# Patient Record
Sex: Male | Born: 1945 | Race: White | Hispanic: No | Marital: Married | Smoking: Former smoker
Health system: Southern US, Community
[De-identification: ages and names within clinical notes are randomized; demographics above are authoritative.]

## PROBLEM LIST (undated history)

## (undated) DIAGNOSIS — H409 Unspecified glaucoma: Secondary | ICD-10-CM

## (undated) DIAGNOSIS — I714 Abdominal aortic aneurysm, without rupture, unspecified: Secondary | ICD-10-CM

## (undated) DIAGNOSIS — E7800 Pure hypercholesterolemia, unspecified: Secondary | ICD-10-CM

## (undated) DIAGNOSIS — C439 Malignant melanoma of skin, unspecified: Secondary | ICD-10-CM

## (undated) DIAGNOSIS — I10 Essential (primary) hypertension: Secondary | ICD-10-CM

## (undated) DIAGNOSIS — E785 Hyperlipidemia, unspecified: Secondary | ICD-10-CM

## (undated) DIAGNOSIS — N189 Chronic kidney disease, unspecified: Secondary | ICD-10-CM

## (undated) DIAGNOSIS — M353 Polymyalgia rheumatica: Secondary | ICD-10-CM

## (undated) DIAGNOSIS — E11319 Type 2 diabetes mellitus with unspecified diabetic retinopathy without macular edema: Secondary | ICD-10-CM

## (undated) DIAGNOSIS — K635 Polyp of colon: Secondary | ICD-10-CM

## (undated) DIAGNOSIS — E119 Type 2 diabetes mellitus without complications: Secondary | ICD-10-CM

## (undated) HISTORY — DX: Unspecified glaucoma: H40.9

## (undated) HISTORY — DX: Type 2 diabetes mellitus with unspecified diabetic retinopathy without macular edema: E11.319

## (undated) HISTORY — DX: Abdominal aortic aneurysm, without rupture, unspecified: I71.40

## (undated) HISTORY — PX: TONSILLECTOMY: SUR1361

## (undated) HISTORY — DX: Hyperlipidemia, unspecified: E78.5

## (undated) HISTORY — DX: Abdominal aortic aneurysm, without rupture: I71.4

## (undated) HISTORY — DX: Malignant melanoma of skin, unspecified: C43.9

## (undated) HISTORY — DX: Chronic kidney disease, unspecified: N18.9

## (undated) HISTORY — DX: Polyp of colon: K63.5

---
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
## 1986-03-22 HISTORY — PX: BACK SURGERY: SHX140

## 2000-01-23 ENCOUNTER — Ambulatory Visit (HOSPITAL_COMMUNITY): Admission: RE | Admit: 2000-01-23 | Discharge: 2000-01-23 | Payer: Self-pay | Admitting: Neurological Surgery

## 2000-01-23 ENCOUNTER — Encounter: Payer: Self-pay | Admitting: Neurological Surgery

## 2000-02-01 ENCOUNTER — Encounter: Admission: RE | Admit: 2000-02-01 | Discharge: 2000-02-15 | Payer: Self-pay | Admitting: Neurological Surgery

## 2003-09-06 ENCOUNTER — Ambulatory Visit (HOSPITAL_COMMUNITY): Admission: RE | Admit: 2003-09-06 | Discharge: 2003-09-06 | Payer: Self-pay | Admitting: Gastroenterology

## 2010-03-22 HISTORY — PX: SKIN SURGERY: SHX2413

## 2010-09-10 ENCOUNTER — Ambulatory Visit (HOSPITAL_COMMUNITY)
Admission: RE | Admit: 2010-09-10 | Discharge: 2010-09-10 | Disposition: A | Discharge status: Home / Self Care | Payer: 59 | Source: Ambulatory Visit | Attending: Gastroenterology | Admitting: Gastroenterology

## 2010-09-10 DIAGNOSIS — Z860100 Personal history of colon polyps, unspecified: Secondary | ICD-10-CM | POA: Insufficient documentation

## 2010-09-10 DIAGNOSIS — Z800 Family history of malignant neoplasm of digestive organs: Secondary | ICD-10-CM | POA: Insufficient documentation

## 2010-09-10 DIAGNOSIS — I10 Essential (primary) hypertension: Secondary | ICD-10-CM | POA: Insufficient documentation

## 2010-09-10 DIAGNOSIS — E119 Type 2 diabetes mellitus without complications: Secondary | ICD-10-CM | POA: Insufficient documentation

## 2010-09-10 DIAGNOSIS — Z09 Encounter for follow-up examination after completed treatment for conditions other than malignant neoplasm: Secondary | ICD-10-CM | POA: Insufficient documentation

## 2010-09-10 LAB — GLUCOSE, CAPILLARY: Glucose-Capillary: 113 mg/dL — ABNORMAL HIGH (ref 70–99)

## 2010-10-01 NOTE — Op Note (Signed)
  NAME:  [name], [name] NO.:  [id number]  MEDICAL RECORD NO.:  [id number]  LOCATION:  [facility]                         FACILITY:  [facility]  PHYSICIAN:  Danise Edge, M.D.   DATE OF BIRTH:  Feb 05, 1946  DATE OF PROCEDURE: DATE OF DISCHARGE:                              OPERATIVE REPORT   PROCEDURE:  Surveillance colonoscopy.  HISTORY:  Dustin Diaz is a 65 year old male, born Aug 11, 1945. The patient underwent a screening colonoscopy in 2005.  A small adenomatous polyp was removed.  He is scheduled to undergo a surveillance colonoscopy with polypectomy to prevent colon cancer.  ENDOSCOPIST:  Danise Edge, M.D.  PREMEDICATION:  Fentanyl 100 mcg, Versed 10 mg.  PROCEDURE:  The patient was placed in the left lateral decubitus position.  I administered intravenous fentanyl and intravenous Versed to achieve conscious sedation for the procedure.  The patient's blood pressure, oxygen saturation and cardiac rhythm were monitored throughout the procedure and documented in the medical record.  Anal inspection and digital rectal exam were normal.  The Pentax pediatric colonoscope was introduced into the rectum and with some degree of difficulty due to poor colonic prep and colonic loop formation eventually advanced to the cecum.  A normal-appearing ileocecal valve and appendiceal orifice were identified.  Unfortunately, the patient's colonic prep was poor.  Right-sided flat colonic polyps could have easily been missed due to poor colonic prep.  Rectum normal.  Retroflex view of the distal rectum normal.  Sigmoid colon and descending colon normal.  Splenic flexure normal.  Transverse colon normal.  Hepatic flexure normal.  Ascending colon normal.  Cecum and ileocecal valve normal.  ASSESSMENT:  Proctocolonoscopy to the cecum in a poorly prepped colon was normal.  I did not identify large polyps or colon cancers.  Small polyps and right-sided flat  polyps could have easily been missed during this examination.          ______________________________ Danise Edge, M.D.     MJ/MEDQ  D:  09/10/2010  T:  09/10/2010  Job:  [id number]  cc:   Theressa Millard, M.D. Fax: [phone]  Electronically Signed by Danise Edge M.D. on 10/01/2010 03:55:35 PM

## 2011-08-17 ENCOUNTER — Other Ambulatory Visit: Payer: Self-pay | Admitting: Dermatology

## 2012-04-10 ENCOUNTER — Other Ambulatory Visit: Payer: Self-pay | Admitting: Dermatology

## 2012-06-10 ENCOUNTER — Encounter (HOSPITAL_COMMUNITY): Payer: Self-pay | Admitting: Emergency Medicine

## 2012-06-10 ENCOUNTER — Emergency Department (HOSPITAL_COMMUNITY)
Admission: EM | Admit: 2012-06-10 | Discharge: 2012-06-10 | Disposition: A | Discharge status: Home / Self Care | Payer: 59 | Source: Home / Self Care | Attending: Emergency Medicine | Admitting: Emergency Medicine

## 2012-06-10 DIAGNOSIS — L237 Allergic contact dermatitis due to plants, except food: Secondary | ICD-10-CM

## 2012-06-10 DIAGNOSIS — L255 Unspecified contact dermatitis due to plants, except food: Secondary | ICD-10-CM

## 2012-06-10 DIAGNOSIS — L259 Unspecified contact dermatitis, unspecified cause: Secondary | ICD-10-CM

## 2012-06-10 HISTORY — DX: Type 2 diabetes mellitus without complications: E11.9

## 2012-06-10 HISTORY — DX: Polymyalgia rheumatica: M35.3

## 2012-06-10 HISTORY — DX: Pure hypercholesterolemia, unspecified: E78.00

## 2012-06-10 HISTORY — DX: Essential (primary) hypertension: I10

## 2012-06-10 MED ORDER — TRIAMCINOLONE ACETONIDE 0.1 % EX CREA: TOPICAL_CREAM | Freq: Two times a day (BID) | CUTANEOUS | Status: DC

## 2012-06-10 MED ORDER — PREDNISONE 20 MG PO TABS: 40.0000 mg | ORAL_TABLET | Freq: Every day | ORAL | Status: DC

## 2012-06-10 MED ORDER — HYDROXYZINE HCL 25 MG PO TABS: 25.0000 mg | ORAL_TABLET | Freq: Four times a day (QID) | ORAL | Status: DC

## 2012-06-10 NOTE — ED Notes (Signed)
Pt c/o poss poison oak onset last Thursday while cleaning up some debris  Rash on bilateral arms and abd Denies: SOB Has been using CalaGel for the discomfort  He is alert and oriented w/no signs of acute distress.

## 2012-06-10 NOTE — ED Provider Notes (Signed)
History     CSN: [id number]  Arrival date & time 06/10/12  1149   First MD Initiated Contact with Patient 06/10/12 1227      Chief Complaint  Patient presents with  . Poison Oak    (Consider location/radiation/quality/duration/timing/severity/associated sxs/prior treatment) HPI Comments: Patient presents urgent care complaining of a itchy rash on both forearms and hands mainly on the outer and inner aspect of his forearms. Scaly red and itchy rash. Patient was complaining after the recent weather he was in contact with some plants that he was removing some treat degrees from his backyard. Patient denies any other systemic symptoms such as fever shortness of breath facial swelling or difficulty swallowing. Have been attempting to use Caladryl for his itchiness  The history is provided by the patient.    Past Medical History  Diagnosis Date  . Hypertension   . Diabetes mellitus without complication   . High cholesterol   . Polymyalgia rheumatica     Past Surgical History  Procedure Laterality Date  . Back surgery    . Appendectomy    . Tonsillectomy      No family history on file.  History  Substance Use Topics  . Smoking status: Never Smoker   . Smokeless tobacco: Not on file  . Alcohol Use: No      Review of Systems  Constitutional: Negative for fever, chills, activity change and appetite change.  Skin: Positive for rash. Negative for color change, pallor and wound.    Allergies  Review of patient's allergies indicates no known allergies.  Home Medications   Current Outpatient Rx  Name  Route  Sig  Dispense  Refill  . aspirin 81 MG tablet   Oral   Take 81 mg by mouth daily.         Marland Kitchen atorvastatin (LIPITOR) 10 MG tablet   Oral   Take 10 mg by mouth daily.         . fluticasone (FLONASE) 50 MCG/ACT nasal spray   Nasal   Place 2 sprays into the nose daily.         Marland Kitchen glimepiride (AMARYL) 4 MG tablet   Oral   Take 4 mg by mouth daily before  breakfast.         . metformin (FORTAMET) 1000 MG (OSM) 24 hr tablet   Oral   Take 1,000 mg by mouth daily with breakfast.         . pioglitazone (ACTOS) 45 MG tablet   Oral   Take 45 mg by mouth daily.         . predniSONE (DELTASONE) 1 MG tablet   Oral   Take 1 mg by mouth daily.         . valsartan (DIOVAN) 160 MG tablet   Oral   Take 160 mg by mouth daily.         . hydrOXYzine (ATARAX/VISTARIL) 25 MG tablet   Oral   Take 1 tablet (25 mg total) by mouth every 6 (six) hours.   12 tablet   0   . predniSONE (DELTASONE) 20 MG tablet   Oral   Take 2 tablets (40 mg total) by mouth daily. 2 tablets daily for 5 days   10 tablet   0   . triamcinolone cream (KENALOG) 0.1 %   Topical   Apply topically 2 (two) times daily.   30 g   0     BP 179/86  Temp(Src) 98 F (36.7 C) (  Oral)  Resp 16  SpO2 97%  Physical Exam  Vitals reviewed. Constitutional: Vital signs are normal. He appears well-developed and well-nourished.  Non-toxic appearance. He does not have a sickly appearance. He does not appear ill. No distress.  Neurological: He is alert.  Skin: Rash noted. No bruising, no burn, no ecchymosis, no lesion and no petechiae noted. There is erythema.       ED Course  Procedures (including critical care time)  Labs Reviewed - No data to display No results found.   1. Contact dermatitis   2. Poison oak dermatitis       MDM  Focal an uncomplicated contact dermatitis most likely poison all on both upper extremities. Patient has been prescribed medium potency local steroid triamcinolone instructed to use prednisone with no improvement is noted after 5-7 days. Have also prescribed hydroxyzine for pruritus. Patient agrees with treatment plan and follow-up care.       Jimmie Molly, MD 06/10/12 [phone]

## 2012-07-24 ENCOUNTER — Encounter: Payer: Self-pay | Admitting: Diagnostic Neuroimaging

## 2012-07-24 ENCOUNTER — Ambulatory Visit (INDEPENDENT_AMBULATORY_CARE_PROVIDER_SITE_OTHER): Payer: 59 | Admitting: Diagnostic Neuroimaging

## 2012-07-24 VITALS — BP 150/89 | HR 65 | Temp 98.1°F | Ht 70.0 in | Wt 205.0 lb

## 2012-07-24 DIAGNOSIS — C4360 Malignant melanoma of unspecified upper limb, including shoulder: Secondary | ICD-10-CM | POA: Insufficient documentation

## 2012-07-24 DIAGNOSIS — R200 Anesthesia of skin: Secondary | ICD-10-CM

## 2012-07-24 DIAGNOSIS — I10 Essential (primary) hypertension: Secondary | ICD-10-CM | POA: Insufficient documentation

## 2012-07-24 DIAGNOSIS — R202 Paresthesia of skin: Secondary | ICD-10-CM

## 2012-07-24 DIAGNOSIS — M353 Polymyalgia rheumatica: Secondary | ICD-10-CM | POA: Insufficient documentation

## 2012-07-24 DIAGNOSIS — E119 Type 2 diabetes mellitus without complications: Secondary | ICD-10-CM | POA: Insufficient documentation

## 2012-07-24 DIAGNOSIS — R209 Unspecified disturbances of skin sensation: Secondary | ICD-10-CM

## 2012-07-24 DIAGNOSIS — C434 Malignant melanoma of scalp and neck: Secondary | ICD-10-CM

## 2012-07-24 DIAGNOSIS — E7800 Pure hypercholesterolemia, unspecified: Secondary | ICD-10-CM | POA: Insufficient documentation

## 2012-07-24 DIAGNOSIS — M542 Cervicalgia: Secondary | ICD-10-CM

## 2012-07-24 NOTE — Progress Notes (Signed)
[facility]  PATIENT: Dustin Diaz DOB: [birth year]  REFERRING CLINICIAN: Sharlyn Bologna HISTORY FROM: patient REASON FOR VISIT: numbness and weakness in right arm, pain shoulder and neck.   HISTORICAL  CHIEF COMPLAINT:  Chief Complaint  Patient presents with  . Numbness    hands  . Extremity Weakness    R. arm  . Pain    shoulder, neck    HISTORY OF PRESENT ILLNESS:  67 year old right-handed male with history of diabetes, polymyalgia rheumatica, hypertension, hypercholesterolemia, here for evaluation of bilateral upper extremity pain and right upper to read weakness. Symptoms have been present for one and a half months. He reports right-sided neck pain, popping sensations in his neck, bilateral upper surgery numbness in his hands. He also feels some weakness in his right upper extremity. He feels like he is losing strength.  Patient also sister polymyalgia rheumatica, has been stabilized on prednisone. Currently on 4 mg prednisone daily.  Significant new symptoms in his lower extremities.   REVIEW OF SYSTEMS: Full 14 system review of systems performed and notable only for spinning sensations going easy bruising joint pain numbness weakness dizziness snoring.  ALLERGIES: Allergies  Allergen Reactions  . Codeine     HOME MEDICATIONS: Outpatient Prescriptions Prior to Visit  Medication Sig Dispense Refill  . aspirin 81 MG tablet Take 81 mg by mouth daily.      Marland Kitchen atorvastatin (LIPITOR) 10 MG tablet Take 10 mg by mouth daily.      . fluticasone (FLONASE) 50 MCG/ACT nasal spray Place 2 sprays into the nose daily.      Marland Kitchen glimepiride (AMARYL) 4 MG tablet Take 4 mg by mouth daily before breakfast.      . metformin (FORTAMET) 1000 MG (OSM) 24 hr tablet Take 500 mg by mouth 2 (two) times daily with a meal.       . pioglitazone (ACTOS) 45 MG tablet Take 45 mg by mouth daily.      . predniSONE (DELTASONE) 1 MG tablet Take 4 mg by mouth daily.       .  valsartan (DIOVAN) 160 MG tablet Take 160 mg by mouth daily.      . hydrOXYzine (ATARAX/VISTARIL) 25 MG tablet Take 1 tablet (25 mg total) by mouth every 6 (six) hours.  12 tablet  0  . predniSONE (DELTASONE) 20 MG tablet Take 2 tablets (40 mg total) by mouth daily. 2 tablets daily for 5 days  10 tablet  0  . triamcinolone cream (KENALOG) 0.1 % Apply topically 2 (two) times daily.  30 g  0   No facility-administered medications prior to visit.    PAST MEDICAL HISTORY: Past Medical History  Diagnosis Date  . Hypertension   . Diabetes mellitus without complication   . High cholesterol   . Polymyalgia rheumatica   . Hyperlipemia     PAST SURGICAL HISTORY: Past Surgical History  Procedure Laterality Date  . Back surgery  1988  . Appendectomy  1956  . Tonsillectomy    . Skin surgery  2012    melanoma removal    FAMILY HISTORY: Family History  Problem Relation Age of Onset  . Heart failure Father     SOCIAL HISTORY:  History   Social History  . Marital Status: Married    Spouse Name: Wahak Hotrontk Sink    Number of Children: 3  . Years of Education: college   Occupational History  . Retired    Social History Main Topics  . Smoking status:  Never Smoker   . Smokeless tobacco: Never Used  . Alcohol Use: Yes     Comment: occ: 1 beer monthly  . Drug Use: No  . Sexually Active: Not on file   Other Topics Concern  . Not on file   Social History Narrative   Pt lives at home with his spouse.    Caffeine Use-3-4 cups daily     PHYSICAL EXAM  Filed Vitals:   07/24/12 1031  BP: 150/89  Pulse: 65  Temp: 98.1 F (36.7 C)  TempSrc: Oral  Height: 5\' 10"  (1.778 m)  Weight: 205 lb (92.987 kg)   Body mass index is 29.41 kg/(m^2).  GENERAL EXAM: Patient is in no distress; CREPITUS IN NECK WITH ROM  CARDIOVASCULAR: Regular rate and rhythm, no murmurs, no carotid bruits  NEUROLOGIC: MENTAL STATUS: awake, alert, language fluent, comprehension intact, naming intact CRANIAL  NERVE: no papilledema on fundoscopic exam, pupils equal and reactive to light, visual fields full to confrontation, extraocular muscles intact, no nystagmus, facial sensation and strength symmetric, uvula midline, shoulder shrug symmetric, tongue midline. MOTOR: normal bulk and tone, full strength in the BUE, BLE; SLIGHTLY DECREASED GRIP IN BUE. NO FASCICULATIONS. SENSORY: normal and symmetric to light touch, pinprick, temperature, vibration and proprioception COORDINATION: finger-nose-finger, fine finger movements normal REFLEXES: BUE TRACE/ABSENT; BILATERAL KNEES 1, RIGHT ANKLE 1, LEFT ANKLE TRACE GAIT/STATION: narrow based gait; able to walk on toes, heels and tandem; romberg is negative   DIAGNOSTIC DATA (LABS, IMAGING, TESTING) - I reviewed patient records, labs, notes, testing and imaging myself where available.  No results found for this basename: WBC, HGB, HCT, MCV, PLT   No results found for this basename: na, k, cl, co2, glucose, bun, creatinine, calcium, prot, albumin, ast, alt, alkphos, bilitot, gfrnonaa, gfraa   No results found for this basename: CHOL, HDL, LDLCALC, LDLDIRECT, TRIG, CHOLHDL   No results found for this basename: HGBA1C   No results found for this basename: VITAMINB12   No results found for this basename: TSH     ASSESSMENT AND PLAN  67 y.o. year old male  has a past medical history of Hypertension; Diabetes mellitus without complication; High cholesterol; Polymyalgia rheumatica; and Hyperlipemia. here with neck pain and right greater than left upper extremity numbness or weakness. Symptoms and exam findings concerning for cervical spine pathology/radiculopathy.  I will check MRI of the cervical spine, B12, TSH, CK levels. Depending on these results we may consider EMG nerve conduction study.  Suanne Marker, MD 07/24/2012, 4:27 PM (with LYNN LAM NP-C) Certified in Neurology, Neurophysiology and Neuroimaging  [facility] [address] [place], [place] [address] [phone]

## 2012-07-24 NOTE — Patient Instructions (Addendum)
I will check MRI cervical spine.   I will check labs.

## 2012-07-25 LAB — TSH: TSH: 3.83 u[IU]/mL (ref 0.450–4.500)

## 2012-07-25 LAB — VITAMIN B12: Vitamin B-12: 214 pg/mL (ref 211–946)

## 2012-07-25 LAB — CK: Total CK: 47 U/L (ref 24–204)

## 2012-07-28 NOTE — Addendum Note (Signed)
Addended byJoycelyn Schmid on: 07/28/2012 05:55 PM   Modules accepted: Orders

## 2012-08-02 ENCOUNTER — Ambulatory Visit (INDEPENDENT_AMBULATORY_CARE_PROVIDER_SITE_OTHER): Payer: 59

## 2012-08-02 DIAGNOSIS — R202 Paresthesia of skin: Secondary | ICD-10-CM

## 2012-08-02 DIAGNOSIS — M542 Cervicalgia: Secondary | ICD-10-CM

## 2012-08-02 DIAGNOSIS — R209 Unspecified disturbances of skin sensation: Secondary | ICD-10-CM

## 2012-08-02 DIAGNOSIS — R200 Anesthesia of skin: Secondary | ICD-10-CM

## 2012-08-04 ENCOUNTER — Telehealth: Payer: Self-pay | Admitting: Diagnostic Neuroimaging

## 2012-08-04 NOTE — Telephone Encounter (Signed)
I called patient. MRI c-spine is normal. Labs show borderline low B12.   Next step is to repeat B12 with MMA and homocysteine and consider EMG/NCS. Patient wants to check labs and hold off on EMG for now. Symptoms are stable.  -VRP

## 2013-08-07 ENCOUNTER — Other Ambulatory Visit: Payer: Self-pay | Admitting: Dermatology

## 2013-10-12 DIAGNOSIS — C434 Malignant melanoma of scalp and neck: Secondary | ICD-10-CM | POA: Diagnosis not present

## 2014-02-07 ENCOUNTER — Other Ambulatory Visit: Payer: Self-pay | Admitting: Internal Medicine

## 2014-02-07 ENCOUNTER — Other Ambulatory Visit: Payer: Self-pay | Admitting: Dermatology

## 2014-02-07 DIAGNOSIS — D485 Neoplasm of uncertain behavior of skin: Secondary | ICD-10-CM | POA: Diagnosis not present

## 2014-02-07 DIAGNOSIS — Z125 Encounter for screening for malignant neoplasm of prostate: Secondary | ICD-10-CM | POA: Diagnosis not present

## 2014-02-07 DIAGNOSIS — I10 Essential (primary) hypertension: Secondary | ICD-10-CM | POA: Diagnosis not present

## 2014-02-07 DIAGNOSIS — R634 Abnormal weight loss: Secondary | ICD-10-CM | POA: Diagnosis not present

## 2014-02-07 DIAGNOSIS — Z08 Encounter for follow-up examination after completed treatment for malignant neoplasm: Secondary | ICD-10-CM | POA: Diagnosis not present

## 2014-02-07 DIAGNOSIS — Z85820 Personal history of malignant melanoma of skin: Secondary | ICD-10-CM | POA: Diagnosis not present

## 2014-02-07 DIAGNOSIS — M353 Polymyalgia rheumatica: Secondary | ICD-10-CM | POA: Diagnosis not present

## 2014-02-07 DIAGNOSIS — L570 Actinic keratosis: Secondary | ICD-10-CM | POA: Diagnosis not present

## 2014-02-07 DIAGNOSIS — L814 Other melanin hyperpigmentation: Secondary | ICD-10-CM | POA: Diagnosis not present

## 2014-02-07 DIAGNOSIS — L281 Prurigo nodularis: Secondary | ICD-10-CM | POA: Diagnosis not present

## 2014-02-07 DIAGNOSIS — D225 Melanocytic nevi of trunk: Secondary | ICD-10-CM | POA: Diagnosis not present

## 2014-02-07 DIAGNOSIS — E119 Type 2 diabetes mellitus without complications: Secondary | ICD-10-CM | POA: Diagnosis not present

## 2014-02-07 DIAGNOSIS — Z23 Encounter for immunization: Secondary | ICD-10-CM | POA: Diagnosis not present

## 2014-02-07 DIAGNOSIS — Z1389 Encounter for screening for other disorder: Secondary | ICD-10-CM | POA: Diagnosis not present

## 2014-02-07 DIAGNOSIS — Z0000 Encounter for general adult medical examination without abnormal findings: Secondary | ICD-10-CM

## 2014-02-07 DIAGNOSIS — E08329 Diabetes mellitus due to underlying condition with mild nonproliferative diabetic retinopathy without macular edema: Secondary | ICD-10-CM | POA: Diagnosis not present

## 2014-02-07 DIAGNOSIS — E780 Pure hypercholesterolemia: Secondary | ICD-10-CM | POA: Diagnosis not present

## 2014-02-20 ENCOUNTER — Ambulatory Visit
Admission: RE | Admit: 2014-02-20 | Discharge: 2014-02-20 | Disposition: A | Discharge status: Home / Self Care | Payer: No Typology Code available for payment source | Source: Ambulatory Visit | Attending: Internal Medicine | Admitting: Internal Medicine

## 2014-02-20 DIAGNOSIS — Z0000 Encounter for general adult medical examination without abnormal findings: Secondary | ICD-10-CM

## 2014-02-20 DIAGNOSIS — I714 Abdominal aortic aneurysm, without rupture: Secondary | ICD-10-CM | POA: Diagnosis not present

## 2014-04-19 DIAGNOSIS — J0100 Acute maxillary sinusitis, unspecified: Secondary | ICD-10-CM | POA: Diagnosis not present

## 2014-04-19 DIAGNOSIS — J209 Acute bronchitis, unspecified: Secondary | ICD-10-CM | POA: Diagnosis not present

## 2014-05-03 DIAGNOSIS — H2513 Age-related nuclear cataract, bilateral: Secondary | ICD-10-CM | POA: Diagnosis not present

## 2014-05-03 DIAGNOSIS — H40013 Open angle with borderline findings, low risk, bilateral: Secondary | ICD-10-CM | POA: Diagnosis not present

## 2014-06-12 DIAGNOSIS — I10 Essential (primary) hypertension: Secondary | ICD-10-CM | POA: Diagnosis not present

## 2014-06-12 DIAGNOSIS — M353 Polymyalgia rheumatica: Secondary | ICD-10-CM | POA: Diagnosis not present

## 2014-06-12 DIAGNOSIS — R5383 Other fatigue: Secondary | ICD-10-CM | POA: Diagnosis not present

## 2014-06-12 DIAGNOSIS — E119 Type 2 diabetes mellitus without complications: Secondary | ICD-10-CM | POA: Diagnosis not present

## 2014-08-08 DIAGNOSIS — Z08 Encounter for follow-up examination after completed treatment for malignant neoplasm: Secondary | ICD-10-CM | POA: Diagnosis not present

## 2014-08-08 DIAGNOSIS — L570 Actinic keratosis: Secondary | ICD-10-CM | POA: Diagnosis not present

## 2014-08-08 DIAGNOSIS — L821 Other seborrheic keratosis: Secondary | ICD-10-CM | POA: Diagnosis not present

## 2014-08-08 DIAGNOSIS — Z85820 Personal history of malignant melanoma of skin: Secondary | ICD-10-CM | POA: Diagnosis not present

## 2014-10-15 DIAGNOSIS — E119 Type 2 diabetes mellitus without complications: Secondary | ICD-10-CM | POA: Diagnosis not present

## 2014-10-15 DIAGNOSIS — I10 Essential (primary) hypertension: Secondary | ICD-10-CM | POA: Diagnosis not present

## 2014-11-01 DIAGNOSIS — H40053 Ocular hypertension, bilateral: Secondary | ICD-10-CM | POA: Diagnosis not present

## 2014-11-01 DIAGNOSIS — H524 Presbyopia: Secondary | ICD-10-CM | POA: Diagnosis not present

## 2014-11-01 DIAGNOSIS — H2513 Age-related nuclear cataract, bilateral: Secondary | ICD-10-CM | POA: Diagnosis not present

## 2015-02-05 ENCOUNTER — Other Ambulatory Visit: Payer: Self-pay | Admitting: Internal Medicine

## 2015-02-05 DIAGNOSIS — Z125 Encounter for screening for malignant neoplasm of prostate: Secondary | ICD-10-CM | POA: Diagnosis not present

## 2015-02-05 DIAGNOSIS — Z1389 Encounter for screening for other disorder: Secondary | ICD-10-CM | POA: Diagnosis not present

## 2015-02-05 DIAGNOSIS — E7800 Pure hypercholesterolemia, unspecified: Secondary | ICD-10-CM | POA: Diagnosis not present

## 2015-02-05 DIAGNOSIS — E119 Type 2 diabetes mellitus without complications: Secondary | ICD-10-CM | POA: Diagnosis not present

## 2015-02-05 DIAGNOSIS — E083299 Diabetes mellitus due to underlying condition with mild nonproliferative diabetic retinopathy without macular edema, unspecified eye: Secondary | ICD-10-CM | POA: Diagnosis not present

## 2015-02-05 DIAGNOSIS — I714 Abdominal aortic aneurysm, without rupture, unspecified: Secondary | ICD-10-CM

## 2015-02-05 DIAGNOSIS — I10 Essential (primary) hypertension: Secondary | ICD-10-CM | POA: Diagnosis not present

## 2015-02-05 DIAGNOSIS — Z23 Encounter for immunization: Secondary | ICD-10-CM | POA: Diagnosis not present

## 2015-02-06 DIAGNOSIS — L821 Other seborrheic keratosis: Secondary | ICD-10-CM | POA: Diagnosis not present

## 2015-02-06 DIAGNOSIS — L570 Actinic keratosis: Secondary | ICD-10-CM | POA: Diagnosis not present

## 2015-02-06 DIAGNOSIS — Z85820 Personal history of malignant melanoma of skin: Secondary | ICD-10-CM | POA: Diagnosis not present

## 2015-02-06 DIAGNOSIS — D225 Melanocytic nevi of trunk: Secondary | ICD-10-CM | POA: Diagnosis not present

## 2015-02-06 DIAGNOSIS — D1801 Hemangioma of skin and subcutaneous tissue: Secondary | ICD-10-CM | POA: Diagnosis not present

## 2015-03-06 ENCOUNTER — Ambulatory Visit
Admission: RE | Admit: 2015-03-06 | Discharge: 2015-03-06 | Disposition: A | Discharge status: Home / Self Care | Payer: Medicare Other | Source: Ambulatory Visit | Attending: Internal Medicine | Admitting: Internal Medicine

## 2015-03-06 DIAGNOSIS — I714 Abdominal aortic aneurysm, without rupture, unspecified: Secondary | ICD-10-CM

## 2015-05-06 DIAGNOSIS — E119 Type 2 diabetes mellitus without complications: Secondary | ICD-10-CM | POA: Diagnosis not present

## 2015-05-06 DIAGNOSIS — H40053 Ocular hypertension, bilateral: Secondary | ICD-10-CM | POA: Diagnosis not present

## 2015-06-05 DIAGNOSIS — E119 Type 2 diabetes mellitus without complications: Secondary | ICD-10-CM | POA: Diagnosis not present

## 2015-06-05 DIAGNOSIS — M353 Polymyalgia rheumatica: Secondary | ICD-10-CM | POA: Diagnosis not present

## 2015-06-05 DIAGNOSIS — Z7984 Long term (current) use of oral hypoglycemic drugs: Secondary | ICD-10-CM | POA: Diagnosis not present

## 2015-06-05 DIAGNOSIS — I10 Essential (primary) hypertension: Secondary | ICD-10-CM | POA: Diagnosis not present

## 2015-10-07 DIAGNOSIS — I10 Essential (primary) hypertension: Secondary | ICD-10-CM | POA: Diagnosis not present

## 2015-10-07 DIAGNOSIS — M722 Plantar fascial fibromatosis: Secondary | ICD-10-CM | POA: Diagnosis not present

## 2015-10-07 DIAGNOSIS — Z7984 Long term (current) use of oral hypoglycemic drugs: Secondary | ICD-10-CM | POA: Diagnosis not present

## 2015-10-07 DIAGNOSIS — M7712 Lateral epicondylitis, left elbow: Secondary | ICD-10-CM | POA: Diagnosis not present

## 2015-10-07 DIAGNOSIS — E119 Type 2 diabetes mellitus without complications: Secondary | ICD-10-CM | POA: Diagnosis not present

## 2015-10-07 DIAGNOSIS — M353 Polymyalgia rheumatica: Secondary | ICD-10-CM | POA: Diagnosis not present

## 2016-02-06 DIAGNOSIS — E083299 Diabetes mellitus due to underlying condition with mild nonproliferative diabetic retinopathy without macular edema, unspecified eye: Secondary | ICD-10-CM | POA: Diagnosis not present

## 2016-02-06 DIAGNOSIS — Z23 Encounter for immunization: Secondary | ICD-10-CM | POA: Diagnosis not present

## 2016-02-06 DIAGNOSIS — I10 Essential (primary) hypertension: Secondary | ICD-10-CM | POA: Diagnosis not present

## 2016-02-06 DIAGNOSIS — Z7984 Long term (current) use of oral hypoglycemic drugs: Secondary | ICD-10-CM | POA: Diagnosis not present

## 2016-02-06 DIAGNOSIS — E7800 Pure hypercholesterolemia, unspecified: Secondary | ICD-10-CM | POA: Diagnosis not present

## 2016-02-06 DIAGNOSIS — I714 Abdominal aortic aneurysm, without rupture: Secondary | ICD-10-CM | POA: Diagnosis not present

## 2016-02-06 DIAGNOSIS — Z0000 Encounter for general adult medical examination without abnormal findings: Secondary | ICD-10-CM | POA: Diagnosis not present

## 2016-02-06 DIAGNOSIS — E1165 Type 2 diabetes mellitus with hyperglycemia: Secondary | ICD-10-CM | POA: Diagnosis not present

## 2016-02-06 DIAGNOSIS — M353 Polymyalgia rheumatica: Secondary | ICD-10-CM | POA: Diagnosis not present

## 2016-02-06 DIAGNOSIS — M7712 Lateral epicondylitis, left elbow: Secondary | ICD-10-CM | POA: Diagnosis not present

## 2016-02-06 DIAGNOSIS — Z1389 Encounter for screening for other disorder: Secondary | ICD-10-CM | POA: Diagnosis not present

## 2016-02-10 DIAGNOSIS — L570 Actinic keratosis: Secondary | ICD-10-CM | POA: Diagnosis not present

## 2016-02-10 DIAGNOSIS — Z85820 Personal history of malignant melanoma of skin: Secondary | ICD-10-CM | POA: Diagnosis not present

## 2016-02-10 DIAGNOSIS — L812 Freckles: Secondary | ICD-10-CM | POA: Diagnosis not present

## 2016-02-10 DIAGNOSIS — L219 Seborrheic dermatitis, unspecified: Secondary | ICD-10-CM | POA: Diagnosis not present

## 2016-02-10 DIAGNOSIS — L821 Other seborrheic keratosis: Secondary | ICD-10-CM | POA: Diagnosis not present

## 2016-04-22 DIAGNOSIS — H52223 Regular astigmatism, bilateral: Secondary | ICD-10-CM | POA: Diagnosis not present

## 2016-04-22 DIAGNOSIS — Z0100 Encounter for examination of eyes and vision without abnormal findings: Secondary | ICD-10-CM | POA: Diagnosis not present

## 2016-05-03 DIAGNOSIS — J0100 Acute maxillary sinusitis, unspecified: Secondary | ICD-10-CM | POA: Diagnosis not present

## 2016-06-09 DIAGNOSIS — R5383 Other fatigue: Secondary | ICD-10-CM | POA: Diagnosis not present

## 2016-06-09 DIAGNOSIS — E1165 Type 2 diabetes mellitus with hyperglycemia: Secondary | ICD-10-CM | POA: Diagnosis not present

## 2016-06-09 DIAGNOSIS — E083299 Diabetes mellitus due to underlying condition with mild nonproliferative diabetic retinopathy without macular edema, unspecified eye: Secondary | ICD-10-CM | POA: Diagnosis not present

## 2016-06-09 DIAGNOSIS — I10 Essential (primary) hypertension: Secondary | ICD-10-CM | POA: Diagnosis not present

## 2016-06-09 DIAGNOSIS — Z7984 Long term (current) use of oral hypoglycemic drugs: Secondary | ICD-10-CM | POA: Diagnosis not present

## 2016-10-12 DIAGNOSIS — E083299 Diabetes mellitus due to underlying condition with mild nonproliferative diabetic retinopathy without macular edema, unspecified eye: Secondary | ICD-10-CM | POA: Diagnosis not present

## 2016-10-12 DIAGNOSIS — B351 Tinea unguium: Secondary | ICD-10-CM | POA: Diagnosis not present

## 2016-10-12 DIAGNOSIS — I10 Essential (primary) hypertension: Secondary | ICD-10-CM | POA: Diagnosis not present

## 2016-10-12 DIAGNOSIS — E1165 Type 2 diabetes mellitus with hyperglycemia: Secondary | ICD-10-CM | POA: Diagnosis not present

## 2016-10-12 DIAGNOSIS — R69 Illness, unspecified: Secondary | ICD-10-CM | POA: Diagnosis not present

## 2016-10-12 DIAGNOSIS — Z7984 Long term (current) use of oral hypoglycemic drugs: Secondary | ICD-10-CM | POA: Diagnosis not present

## 2016-10-12 DIAGNOSIS — R3915 Urgency of urination: Secondary | ICD-10-CM | POA: Diagnosis not present

## 2016-12-17 DIAGNOSIS — Z7984 Long term (current) use of oral hypoglycemic drugs: Secondary | ICD-10-CM | POA: Diagnosis not present

## 2016-12-17 DIAGNOSIS — I10 Essential (primary) hypertension: Secondary | ICD-10-CM | POA: Diagnosis not present

## 2016-12-17 DIAGNOSIS — E113299 Type 2 diabetes mellitus with mild nonproliferative diabetic retinopathy without macular edema, unspecified eye: Secondary | ICD-10-CM | POA: Diagnosis not present

## 2016-12-20 DIAGNOSIS — E083299 Diabetes mellitus due to underlying condition with mild nonproliferative diabetic retinopathy without macular edema, unspecified eye: Secondary | ICD-10-CM | POA: Diagnosis not present

## 2016-12-20 DIAGNOSIS — Z7984 Long term (current) use of oral hypoglycemic drugs: Secondary | ICD-10-CM | POA: Diagnosis not present

## 2016-12-20 DIAGNOSIS — E119 Type 2 diabetes mellitus without complications: Secondary | ICD-10-CM | POA: Diagnosis not present

## 2016-12-20 DIAGNOSIS — Z23 Encounter for immunization: Secondary | ICD-10-CM | POA: Diagnosis not present

## 2016-12-28 DIAGNOSIS — E119 Type 2 diabetes mellitus without complications: Secondary | ICD-10-CM | POA: Diagnosis not present

## 2016-12-28 DIAGNOSIS — I10 Essential (primary) hypertension: Secondary | ICD-10-CM | POA: Diagnosis not present

## 2016-12-28 DIAGNOSIS — Z7984 Long term (current) use of oral hypoglycemic drugs: Secondary | ICD-10-CM | POA: Diagnosis not present

## 2016-12-28 DIAGNOSIS — N401 Enlarged prostate with lower urinary tract symptoms: Secondary | ICD-10-CM | POA: Diagnosis not present

## 2016-12-28 DIAGNOSIS — E083299 Diabetes mellitus due to underlying condition with mild nonproliferative diabetic retinopathy without macular edema, unspecified eye: Secondary | ICD-10-CM | POA: Diagnosis not present

## 2017-01-31 DIAGNOSIS — Z0101 Encounter for examination of eyes and vision with abnormal findings: Secondary | ICD-10-CM | POA: Diagnosis not present

## 2017-02-08 ENCOUNTER — Other Ambulatory Visit: Payer: Self-pay | Admitting: Dermatology

## 2017-02-08 DIAGNOSIS — C44311 Basal cell carcinoma of skin of nose: Secondary | ICD-10-CM | POA: Diagnosis not present

## 2017-02-08 DIAGNOSIS — D225 Melanocytic nevi of trunk: Secondary | ICD-10-CM | POA: Diagnosis not present

## 2017-02-08 DIAGNOSIS — L570 Actinic keratosis: Secondary | ICD-10-CM | POA: Diagnosis not present

## 2017-02-08 DIAGNOSIS — C44319 Basal cell carcinoma of skin of other parts of face: Secondary | ICD-10-CM | POA: Diagnosis not present

## 2017-02-08 DIAGNOSIS — B351 Tinea unguium: Secondary | ICD-10-CM | POA: Diagnosis not present

## 2017-02-08 DIAGNOSIS — L821 Other seborrheic keratosis: Secondary | ICD-10-CM | POA: Diagnosis not present

## 2017-02-08 DIAGNOSIS — Z85820 Personal history of malignant melanoma of skin: Secondary | ICD-10-CM | POA: Diagnosis not present

## 2017-02-09 ENCOUNTER — Other Ambulatory Visit: Payer: Self-pay | Admitting: Internal Medicine

## 2017-02-09 DIAGNOSIS — M353 Polymyalgia rheumatica: Secondary | ICD-10-CM | POA: Diagnosis not present

## 2017-02-09 DIAGNOSIS — R350 Frequency of micturition: Secondary | ICD-10-CM | POA: Diagnosis not present

## 2017-02-09 DIAGNOSIS — I714 Abdominal aortic aneurysm, without rupture, unspecified: Secondary | ICD-10-CM

## 2017-02-09 DIAGNOSIS — E119 Type 2 diabetes mellitus without complications: Secondary | ICD-10-CM | POA: Diagnosis not present

## 2017-02-09 DIAGNOSIS — J209 Acute bronchitis, unspecified: Secondary | ICD-10-CM | POA: Diagnosis not present

## 2017-02-09 DIAGNOSIS — Z0000 Encounter for general adult medical examination without abnormal findings: Secondary | ICD-10-CM | POA: Diagnosis not present

## 2017-02-09 DIAGNOSIS — N401 Enlarged prostate with lower urinary tract symptoms: Secondary | ICD-10-CM | POA: Diagnosis not present

## 2017-02-09 DIAGNOSIS — I10 Essential (primary) hypertension: Secondary | ICD-10-CM | POA: Diagnosis not present

## 2017-02-09 DIAGNOSIS — E7800 Pure hypercholesterolemia, unspecified: Secondary | ICD-10-CM | POA: Diagnosis not present

## 2017-02-09 DIAGNOSIS — Z1389 Encounter for screening for other disorder: Secondary | ICD-10-CM | POA: Diagnosis not present

## 2017-02-14 DIAGNOSIS — N401 Enlarged prostate with lower urinary tract symptoms: Secondary | ICD-10-CM | POA: Diagnosis not present

## 2017-02-14 DIAGNOSIS — I10 Essential (primary) hypertension: Secondary | ICD-10-CM | POA: Diagnosis not present

## 2017-02-17 ENCOUNTER — Ambulatory Visit
Admission: RE | Admit: 2017-02-17 | Discharge: 2017-02-17 | Disposition: A | Discharge status: Home / Self Care | Payer: Medicare Other | Source: Ambulatory Visit | Attending: Internal Medicine | Admitting: Internal Medicine

## 2017-02-17 DIAGNOSIS — I714 Abdominal aortic aneurysm, without rupture, unspecified: Secondary | ICD-10-CM

## 2017-04-14 DIAGNOSIS — R69 Illness, unspecified: Secondary | ICD-10-CM | POA: Diagnosis not present

## 2017-04-29 DIAGNOSIS — H2513 Age-related nuclear cataract, bilateral: Secondary | ICD-10-CM | POA: Diagnosis not present

## 2017-04-29 DIAGNOSIS — H40053 Ocular hypertension, bilateral: Secondary | ICD-10-CM | POA: Diagnosis not present

## 2017-04-29 DIAGNOSIS — H40013 Open angle with borderline findings, low risk, bilateral: Secondary | ICD-10-CM | POA: Diagnosis not present

## 2017-04-29 DIAGNOSIS — H35033 Hypertensive retinopathy, bilateral: Secondary | ICD-10-CM | POA: Diagnosis not present

## 2017-05-23 DIAGNOSIS — M25511 Pain in right shoulder: Secondary | ICD-10-CM | POA: Diagnosis not present

## 2017-05-23 DIAGNOSIS — I10 Essential (primary) hypertension: Secondary | ICD-10-CM | POA: Diagnosis not present

## 2017-05-23 DIAGNOSIS — M353 Polymyalgia rheumatica: Secondary | ICD-10-CM | POA: Diagnosis not present

## 2017-05-23 DIAGNOSIS — E113293 Type 2 diabetes mellitus with mild nonproliferative diabetic retinopathy without macular edema, bilateral: Secondary | ICD-10-CM | POA: Diagnosis not present

## 2017-05-25 DIAGNOSIS — H409 Unspecified glaucoma: Secondary | ICD-10-CM | POA: Diagnosis not present

## 2017-05-25 DIAGNOSIS — N401 Enlarged prostate with lower urinary tract symptoms: Secondary | ICD-10-CM | POA: Diagnosis not present

## 2017-05-25 DIAGNOSIS — I10 Essential (primary) hypertension: Secondary | ICD-10-CM | POA: Diagnosis not present

## 2017-09-23 DIAGNOSIS — E113293 Type 2 diabetes mellitus with mild nonproliferative diabetic retinopathy without macular edema, bilateral: Secondary | ICD-10-CM | POA: Diagnosis not present

## 2017-09-23 DIAGNOSIS — E113299 Type 2 diabetes mellitus with mild nonproliferative diabetic retinopathy without macular edema, unspecified eye: Secondary | ICD-10-CM | POA: Diagnosis not present

## 2017-09-23 DIAGNOSIS — Z7984 Long term (current) use of oral hypoglycemic drugs: Secondary | ICD-10-CM | POA: Diagnosis not present

## 2017-09-23 DIAGNOSIS — I10 Essential (primary) hypertension: Secondary | ICD-10-CM | POA: Diagnosis not present

## 2017-09-23 DIAGNOSIS — N401 Enlarged prostate with lower urinary tract symptoms: Secondary | ICD-10-CM | POA: Diagnosis not present

## 2017-10-07 DIAGNOSIS — E113293 Type 2 diabetes mellitus with mild nonproliferative diabetic retinopathy without macular edema, bilateral: Secondary | ICD-10-CM | POA: Diagnosis not present

## 2017-10-07 DIAGNOSIS — Z7984 Long term (current) use of oral hypoglycemic drugs: Secondary | ICD-10-CM | POA: Diagnosis not present

## 2017-10-13 DIAGNOSIS — E113293 Type 2 diabetes mellitus with mild nonproliferative diabetic retinopathy without macular edema, bilateral: Secondary | ICD-10-CM | POA: Diagnosis not present

## 2017-10-13 DIAGNOSIS — I10 Essential (primary) hypertension: Secondary | ICD-10-CM | POA: Diagnosis not present

## 2017-10-13 DIAGNOSIS — N401 Enlarged prostate with lower urinary tract symptoms: Secondary | ICD-10-CM | POA: Diagnosis not present

## 2017-10-13 DIAGNOSIS — E1122 Type 2 diabetes mellitus with diabetic chronic kidney disease: Secondary | ICD-10-CM | POA: Diagnosis not present

## 2017-10-13 DIAGNOSIS — Z7984 Long term (current) use of oral hypoglycemic drugs: Secondary | ICD-10-CM | POA: Diagnosis not present

## 2017-10-13 DIAGNOSIS — N182 Chronic kidney disease, stage 2 (mild): Secondary | ICD-10-CM | POA: Diagnosis not present

## 2017-10-13 DIAGNOSIS — H409 Unspecified glaucoma: Secondary | ICD-10-CM | POA: Diagnosis not present

## 2017-10-17 DIAGNOSIS — R69 Illness, unspecified: Secondary | ICD-10-CM | POA: Diagnosis not present

## 2017-10-28 DIAGNOSIS — H2513 Age-related nuclear cataract, bilateral: Secondary | ICD-10-CM | POA: Diagnosis not present

## 2017-10-28 DIAGNOSIS — H40053 Ocular hypertension, bilateral: Secondary | ICD-10-CM | POA: Diagnosis not present

## 2017-10-28 DIAGNOSIS — E113293 Type 2 diabetes mellitus with mild nonproliferative diabetic retinopathy without macular edema, bilateral: Secondary | ICD-10-CM | POA: Diagnosis not present

## 2017-10-28 DIAGNOSIS — H35033 Hypertensive retinopathy, bilateral: Secondary | ICD-10-CM | POA: Diagnosis not present

## 2018-01-30 DIAGNOSIS — Z23 Encounter for immunization: Secondary | ICD-10-CM | POA: Diagnosis not present

## 2018-01-30 DIAGNOSIS — E1122 Type 2 diabetes mellitus with diabetic chronic kidney disease: Secondary | ICD-10-CM | POA: Diagnosis not present

## 2018-02-08 DIAGNOSIS — L570 Actinic keratosis: Secondary | ICD-10-CM | POA: Diagnosis not present

## 2018-02-08 DIAGNOSIS — D225 Melanocytic nevi of trunk: Secondary | ICD-10-CM | POA: Diagnosis not present

## 2018-02-08 DIAGNOSIS — Z85820 Personal history of malignant melanoma of skin: Secondary | ICD-10-CM | POA: Diagnosis not present

## 2018-02-08 DIAGNOSIS — K130 Diseases of lips: Secondary | ICD-10-CM | POA: Diagnosis not present

## 2018-02-08 DIAGNOSIS — Z85828 Personal history of other malignant neoplasm of skin: Secondary | ICD-10-CM | POA: Diagnosis not present

## 2018-02-08 DIAGNOSIS — B351 Tinea unguium: Secondary | ICD-10-CM | POA: Diagnosis not present

## 2018-02-10 DIAGNOSIS — Z1389 Encounter for screening for other disorder: Secondary | ICD-10-CM | POA: Diagnosis not present

## 2018-02-10 DIAGNOSIS — Z23 Encounter for immunization: Secondary | ICD-10-CM | POA: Diagnosis not present

## 2018-02-10 DIAGNOSIS — E1122 Type 2 diabetes mellitus with diabetic chronic kidney disease: Secondary | ICD-10-CM | POA: Diagnosis not present

## 2018-02-10 DIAGNOSIS — E7800 Pure hypercholesterolemia, unspecified: Secondary | ICD-10-CM | POA: Diagnosis not present

## 2018-02-10 DIAGNOSIS — N182 Chronic kidney disease, stage 2 (mild): Secondary | ICD-10-CM | POA: Diagnosis not present

## 2018-02-10 DIAGNOSIS — Z0000 Encounter for general adult medical examination without abnormal findings: Secondary | ICD-10-CM | POA: Diagnosis not present

## 2018-02-10 DIAGNOSIS — E113299 Type 2 diabetes mellitus with mild nonproliferative diabetic retinopathy without macular edema, unspecified eye: Secondary | ICD-10-CM | POA: Diagnosis not present

## 2018-02-10 DIAGNOSIS — N401 Enlarged prostate with lower urinary tract symptoms: Secondary | ICD-10-CM | POA: Diagnosis not present

## 2018-02-10 DIAGNOSIS — D126 Benign neoplasm of colon, unspecified: Secondary | ICD-10-CM | POA: Diagnosis not present

## 2018-02-10 DIAGNOSIS — E113293 Type 2 diabetes mellitus with mild nonproliferative diabetic retinopathy without macular edema, bilateral: Secondary | ICD-10-CM | POA: Diagnosis not present

## 2018-02-10 DIAGNOSIS — I10 Essential (primary) hypertension: Secondary | ICD-10-CM | POA: Diagnosis not present

## 2018-02-13 DIAGNOSIS — H409 Unspecified glaucoma: Secondary | ICD-10-CM | POA: Diagnosis not present

## 2018-02-13 DIAGNOSIS — I10 Essential (primary) hypertension: Secondary | ICD-10-CM | POA: Diagnosis not present

## 2018-02-13 DIAGNOSIS — N401 Enlarged prostate with lower urinary tract symptoms: Secondary | ICD-10-CM | POA: Diagnosis not present

## 2018-02-13 DIAGNOSIS — N182 Chronic kidney disease, stage 2 (mild): Secondary | ICD-10-CM | POA: Diagnosis not present

## 2018-02-13 DIAGNOSIS — E1122 Type 2 diabetes mellitus with diabetic chronic kidney disease: Secondary | ICD-10-CM | POA: Diagnosis not present

## 2018-04-18 DIAGNOSIS — R69 Illness, unspecified: Secondary | ICD-10-CM | POA: Diagnosis not present

## 2018-05-02 DIAGNOSIS — H35033 Hypertensive retinopathy, bilateral: Secondary | ICD-10-CM | POA: Diagnosis not present

## 2018-05-02 DIAGNOSIS — N401 Enlarged prostate with lower urinary tract symptoms: Secondary | ICD-10-CM | POA: Diagnosis not present

## 2018-05-02 DIAGNOSIS — E113293 Type 2 diabetes mellitus with mild nonproliferative diabetic retinopathy without macular edema, bilateral: Secondary | ICD-10-CM | POA: Diagnosis not present

## 2018-05-02 DIAGNOSIS — H2513 Age-related nuclear cataract, bilateral: Secondary | ICD-10-CM | POA: Diagnosis not present

## 2018-05-02 DIAGNOSIS — I10 Essential (primary) hypertension: Secondary | ICD-10-CM | POA: Diagnosis not present

## 2018-05-02 DIAGNOSIS — H40013 Open angle with borderline findings, low risk, bilateral: Secondary | ICD-10-CM | POA: Diagnosis not present

## 2018-05-02 DIAGNOSIS — N182 Chronic kidney disease, stage 2 (mild): Secondary | ICD-10-CM | POA: Diagnosis not present

## 2018-05-02 DIAGNOSIS — E1122 Type 2 diabetes mellitus with diabetic chronic kidney disease: Secondary | ICD-10-CM | POA: Diagnosis not present

## 2018-05-02 DIAGNOSIS — H409 Unspecified glaucoma: Secondary | ICD-10-CM | POA: Diagnosis not present

## 2018-05-02 DIAGNOSIS — Z7984 Long term (current) use of oral hypoglycemic drugs: Secondary | ICD-10-CM | POA: Diagnosis not present

## 2018-06-27 DIAGNOSIS — N182 Chronic kidney disease, stage 2 (mild): Secondary | ICD-10-CM | POA: Diagnosis not present

## 2018-06-27 DIAGNOSIS — Z7984 Long term (current) use of oral hypoglycemic drugs: Secondary | ICD-10-CM | POA: Diagnosis not present

## 2018-06-27 DIAGNOSIS — E1122 Type 2 diabetes mellitus with diabetic chronic kidney disease: Secondary | ICD-10-CM | POA: Diagnosis not present

## 2018-06-27 DIAGNOSIS — N401 Enlarged prostate with lower urinary tract symptoms: Secondary | ICD-10-CM | POA: Diagnosis not present

## 2018-06-27 DIAGNOSIS — H409 Unspecified glaucoma: Secondary | ICD-10-CM | POA: Diagnosis not present

## 2018-06-27 DIAGNOSIS — I10 Essential (primary) hypertension: Secondary | ICD-10-CM | POA: Diagnosis not present

## 2018-06-28 DIAGNOSIS — N182 Chronic kidney disease, stage 2 (mild): Secondary | ICD-10-CM | POA: Diagnosis not present

## 2018-06-28 DIAGNOSIS — E113293 Type 2 diabetes mellitus with mild nonproliferative diabetic retinopathy without macular edema, bilateral: Secondary | ICD-10-CM | POA: Diagnosis not present

## 2018-06-28 DIAGNOSIS — I10 Essential (primary) hypertension: Secondary | ICD-10-CM | POA: Diagnosis not present

## 2018-06-28 DIAGNOSIS — E1122 Type 2 diabetes mellitus with diabetic chronic kidney disease: Secondary | ICD-10-CM | POA: Diagnosis not present

## 2018-08-07 DIAGNOSIS — E113293 Type 2 diabetes mellitus with mild nonproliferative diabetic retinopathy without macular edema, bilateral: Secondary | ICD-10-CM | POA: Diagnosis not present

## 2018-08-07 DIAGNOSIS — Z7984 Long term (current) use of oral hypoglycemic drugs: Secondary | ICD-10-CM | POA: Diagnosis not present

## 2018-08-10 DIAGNOSIS — Z7984 Long term (current) use of oral hypoglycemic drugs: Secondary | ICD-10-CM | POA: Diagnosis not present

## 2018-08-10 DIAGNOSIS — E113293 Type 2 diabetes mellitus with mild nonproliferative diabetic retinopathy without macular edema, bilateral: Secondary | ICD-10-CM | POA: Diagnosis not present

## 2018-08-30 DIAGNOSIS — N400 Enlarged prostate without lower urinary tract symptoms: Secondary | ICD-10-CM | POA: Diagnosis not present

## 2018-08-30 DIAGNOSIS — E785 Hyperlipidemia, unspecified: Secondary | ICD-10-CM | POA: Diagnosis not present

## 2018-08-30 DIAGNOSIS — I10 Essential (primary) hypertension: Secondary | ICD-10-CM | POA: Diagnosis not present

## 2018-08-30 DIAGNOSIS — M353 Polymyalgia rheumatica: Secondary | ICD-10-CM | POA: Diagnosis not present

## 2018-08-30 DIAGNOSIS — G8929 Other chronic pain: Secondary | ICD-10-CM | POA: Diagnosis not present

## 2018-08-30 DIAGNOSIS — H409 Unspecified glaucoma: Secondary | ICD-10-CM | POA: Diagnosis not present

## 2018-08-30 DIAGNOSIS — J309 Allergic rhinitis, unspecified: Secondary | ICD-10-CM | POA: Diagnosis not present

## 2018-08-30 DIAGNOSIS — Z7952 Long term (current) use of systemic steroids: Secondary | ICD-10-CM | POA: Diagnosis not present

## 2018-08-30 DIAGNOSIS — E119 Type 2 diabetes mellitus without complications: Secondary | ICD-10-CM | POA: Diagnosis not present

## 2018-08-30 DIAGNOSIS — M1990 Unspecified osteoarthritis, unspecified site: Secondary | ICD-10-CM | POA: Diagnosis not present

## 2018-10-31 DIAGNOSIS — H40013 Open angle with borderline findings, low risk, bilateral: Secondary | ICD-10-CM | POA: Diagnosis not present

## 2018-10-31 DIAGNOSIS — H2513 Age-related nuclear cataract, bilateral: Secondary | ICD-10-CM | POA: Diagnosis not present

## 2018-10-31 DIAGNOSIS — H35033 Hypertensive retinopathy, bilateral: Secondary | ICD-10-CM | POA: Diagnosis not present

## 2018-10-31 DIAGNOSIS — E113293 Type 2 diabetes mellitus with mild nonproliferative diabetic retinopathy without macular edema, bilateral: Secondary | ICD-10-CM | POA: Diagnosis not present

## 2018-10-31 DIAGNOSIS — H40053 Ocular hypertension, bilateral: Secondary | ICD-10-CM | POA: Diagnosis not present

## 2018-11-13 DIAGNOSIS — L858 Other specified epidermal thickening: Secondary | ICD-10-CM | POA: Diagnosis not present

## 2018-11-13 DIAGNOSIS — E113293 Type 2 diabetes mellitus with mild nonproliferative diabetic retinopathy without macular edema, bilateral: Secondary | ICD-10-CM | POA: Diagnosis not present

## 2018-11-13 DIAGNOSIS — N182 Chronic kidney disease, stage 2 (mild): Secondary | ICD-10-CM | POA: Diagnosis not present

## 2018-11-13 DIAGNOSIS — E1122 Type 2 diabetes mellitus with diabetic chronic kidney disease: Secondary | ICD-10-CM | POA: Diagnosis not present

## 2018-11-13 DIAGNOSIS — Z7984 Long term (current) use of oral hypoglycemic drugs: Secondary | ICD-10-CM | POA: Diagnosis not present

## 2018-11-13 DIAGNOSIS — R5383 Other fatigue: Secondary | ICD-10-CM | POA: Diagnosis not present

## 2018-11-13 DIAGNOSIS — I10 Essential (primary) hypertension: Secondary | ICD-10-CM | POA: Diagnosis not present

## 2018-11-13 DIAGNOSIS — Z23 Encounter for immunization: Secondary | ICD-10-CM | POA: Diagnosis not present

## 2018-11-16 DIAGNOSIS — C4442 Squamous cell carcinoma of skin of scalp and neck: Secondary | ICD-10-CM | POA: Diagnosis not present

## 2018-12-15 DIAGNOSIS — Z08 Encounter for follow-up examination after completed treatment for malignant neoplasm: Secondary | ICD-10-CM | POA: Diagnosis not present

## 2018-12-15 DIAGNOSIS — L570 Actinic keratosis: Secondary | ICD-10-CM | POA: Diagnosis not present

## 2018-12-15 DIAGNOSIS — Z85828 Personal history of other malignant neoplasm of skin: Secondary | ICD-10-CM | POA: Diagnosis not present

## 2018-12-15 DIAGNOSIS — X32XXXA Exposure to sunlight, initial encounter: Secondary | ICD-10-CM | POA: Diagnosis not present

## 2019-01-27 DIAGNOSIS — Z23 Encounter for immunization: Secondary | ICD-10-CM | POA: Diagnosis not present

## 2019-02-13 DIAGNOSIS — R69 Illness, unspecified: Secondary | ICD-10-CM | POA: Diagnosis not present

## 2019-02-19 DIAGNOSIS — N401 Enlarged prostate with lower urinary tract symptoms: Secondary | ICD-10-CM | POA: Diagnosis not present

## 2019-02-19 DIAGNOSIS — H409 Unspecified glaucoma: Secondary | ICD-10-CM | POA: Diagnosis not present

## 2019-02-19 DIAGNOSIS — E7800 Pure hypercholesterolemia, unspecified: Secondary | ICD-10-CM | POA: Diagnosis not present

## 2019-02-19 DIAGNOSIS — E1122 Type 2 diabetes mellitus with diabetic chronic kidney disease: Secondary | ICD-10-CM | POA: Diagnosis not present

## 2019-02-19 DIAGNOSIS — I10 Essential (primary) hypertension: Secondary | ICD-10-CM | POA: Diagnosis not present

## 2019-02-19 DIAGNOSIS — N182 Chronic kidney disease, stage 2 (mild): Secondary | ICD-10-CM | POA: Diagnosis not present

## 2019-02-19 DIAGNOSIS — Z7984 Long term (current) use of oral hypoglycemic drugs: Secondary | ICD-10-CM | POA: Diagnosis not present

## 2019-03-01 DIAGNOSIS — Z7984 Long term (current) use of oral hypoglycemic drugs: Secondary | ICD-10-CM | POA: Diagnosis not present

## 2019-03-01 DIAGNOSIS — I10 Essential (primary) hypertension: Secondary | ICD-10-CM | POA: Diagnosis not present

## 2019-03-01 DIAGNOSIS — N182 Chronic kidney disease, stage 2 (mild): Secondary | ICD-10-CM | POA: Diagnosis not present

## 2019-03-01 DIAGNOSIS — Z86010 Personal history of colonic polyps: Secondary | ICD-10-CM | POA: Diagnosis not present

## 2019-03-01 DIAGNOSIS — M353 Polymyalgia rheumatica: Secondary | ICD-10-CM | POA: Diagnosis not present

## 2019-03-01 DIAGNOSIS — E1122 Type 2 diabetes mellitus with diabetic chronic kidney disease: Secondary | ICD-10-CM | POA: Diagnosis not present

## 2019-03-01 DIAGNOSIS — E113299 Type 2 diabetes mellitus with mild nonproliferative diabetic retinopathy without macular edema, unspecified eye: Secondary | ICD-10-CM | POA: Diagnosis not present

## 2019-03-01 DIAGNOSIS — E7800 Pure hypercholesterolemia, unspecified: Secondary | ICD-10-CM | POA: Diagnosis not present

## 2019-03-01 DIAGNOSIS — Z0000 Encounter for general adult medical examination without abnormal findings: Secondary | ICD-10-CM | POA: Diagnosis not present

## 2019-03-01 DIAGNOSIS — E113293 Type 2 diabetes mellitus with mild nonproliferative diabetic retinopathy without macular edema, bilateral: Secondary | ICD-10-CM | POA: Diagnosis not present

## 2019-03-01 DIAGNOSIS — Z1389 Encounter for screening for other disorder: Secondary | ICD-10-CM | POA: Diagnosis not present

## 2019-03-09 DIAGNOSIS — H40013 Open angle with borderline findings, low risk, bilateral: Secondary | ICD-10-CM | POA: Diagnosis not present

## 2019-03-09 DIAGNOSIS — E113293 Type 2 diabetes mellitus with mild nonproliferative diabetic retinopathy without macular edema, bilateral: Secondary | ICD-10-CM | POA: Diagnosis not present

## 2019-03-09 DIAGNOSIS — H35033 Hypertensive retinopathy, bilateral: Secondary | ICD-10-CM | POA: Diagnosis not present

## 2019-03-09 DIAGNOSIS — Z0100 Encounter for examination of eyes and vision without abnormal findings: Secondary | ICD-10-CM | POA: Diagnosis not present

## 2019-03-09 DIAGNOSIS — H2513 Age-related nuclear cataract, bilateral: Secondary | ICD-10-CM | POA: Diagnosis not present

## 2019-03-13 DIAGNOSIS — R69 Illness, unspecified: Secondary | ICD-10-CM | POA: Diagnosis not present

## 2019-04-02 DIAGNOSIS — U071 COVID-19: Secondary | ICD-10-CM | POA: Diagnosis not present

## 2019-04-04 ENCOUNTER — Other Ambulatory Visit: Payer: Self-pay | Admitting: Nurse Practitioner

## 2019-04-04 DIAGNOSIS — U071 COVID-19: Secondary | ICD-10-CM

## 2019-04-04 NOTE — Progress Notes (Signed)
  I connected by phone with Gavin Pound Sculley on 04/04/2019 at 3:15 PM to discuss the potential use of an new treatment for mild to moderate COVID-19 viral infection in non-hospitalized patients.  This patient is a 74 y.o. male that meets the FDA criteria for Emergency Use Authorization of bamlanivimab or casirivimab\imdevimab.  Has a (+) direct SARS-CoV-2 viral test result  Has mild or moderate COVID-19   Is ? 73 years of age and weighs ? 40 kg  Is NOT hospitalized due to COVID-19  Is NOT requiring oxygen therapy or requiring an increase in baseline oxygen flow rate due to COVID-19  Is within 10 days of symptom onset  Has at least one of the high risk factor(s) for progression to severe COVID-19 and/or hospitalization as defined in EUA.  Specific high risk criteria : >/= 73 yo   I have spoken and communicated the following to the patient or parent/caregiver:  1. FDA has authorized the emergency use of bamlanivimab and casirivimab\imdevimab for the treatment of mild to moderate COVID-19 in adults and pediatric patients with positive results of direct SARS-CoV-2 viral testing who are 61 years of age and older weighing at least 40 kg, and who are at high risk for progressing to severe COVID-19 and/or hospitalization.  2. The significant known and potential risks and benefits of bamlanivimab and casirivimab\imdevimab, and the extent to which such potential risks and benefits are unknown.  3. Information on available alternative treatments and the risks and benefits of those alternatives, including clinical trials.  4. Patients treated with bamlanivimab and casirivimab\imdevimab should continue to self-isolate and use infection control measures (e.g., wear mask, isolate, social distance, avoid sharing personal items, clean and disinfect "high touch" surfaces, and frequent handwashing) according to CDC guidelines.   5. The patient or parent/caregiver has the option to accept or refuse  bamlanivimab or casirivimab\imdevimab .  After reviewing this information with the patient, The patient agreed to proceed with receiving the bamlanimivab infusion and will be provided a copy of the Fact sheet prior to receiving the infusion.Fenton Foy 04/04/2019 3:15 PM

## 2019-04-05 ENCOUNTER — Other Ambulatory Visit: Payer: Self-pay

## 2019-04-05 ENCOUNTER — Encounter (HOSPITAL_COMMUNITY): Payer: Self-pay | Admitting: Emergency Medicine

## 2019-04-05 ENCOUNTER — Emergency Department (HOSPITAL_COMMUNITY): Payer: Medicare HMO

## 2019-04-05 ENCOUNTER — Inpatient Hospital Stay (HOSPITAL_COMMUNITY)
Admission: EM | Admit: 2019-04-05 | Discharge: 2019-05-03 | DRG: 177 | Disposition: A | Discharge status: Home Health | Payer: Medicare HMO | Attending: Internal Medicine | Admitting: Internal Medicine

## 2019-04-05 DIAGNOSIS — R238 Other skin changes: Secondary | ICD-10-CM | POA: Diagnosis not present

## 2019-04-05 DIAGNOSIS — K8689 Other specified diseases of pancreas: Secondary | ICD-10-CM | POA: Diagnosis not present

## 2019-04-05 DIAGNOSIS — B001 Herpesviral vesicular dermatitis: Secondary | ICD-10-CM | POA: Diagnosis present

## 2019-04-05 DIAGNOSIS — Z79899 Other long term (current) drug therapy: Secondary | ICD-10-CM

## 2019-04-05 DIAGNOSIS — Z8249 Family history of ischemic heart disease and other diseases of the circulatory system: Secondary | ICD-10-CM | POA: Diagnosis not present

## 2019-04-05 DIAGNOSIS — K862 Cyst of pancreas: Secondary | ICD-10-CM | POA: Diagnosis not present

## 2019-04-05 DIAGNOSIS — I824Z2 Acute embolism and thrombosis of unspecified deep veins of left distal lower extremity: Secondary | ICD-10-CM | POA: Diagnosis not present

## 2019-04-05 DIAGNOSIS — Z7982 Long term (current) use of aspirin: Secondary | ICD-10-CM | POA: Diagnosis not present

## 2019-04-05 DIAGNOSIS — E119 Type 2 diabetes mellitus without complications: Secondary | ICD-10-CM | POA: Diagnosis not present

## 2019-04-05 DIAGNOSIS — Z85820 Personal history of malignant melanoma of skin: Secondary | ICD-10-CM

## 2019-04-05 DIAGNOSIS — J189 Pneumonia, unspecified organism: Secondary | ICD-10-CM | POA: Diagnosis not present

## 2019-04-05 DIAGNOSIS — D849 Immunodeficiency, unspecified: Secondary | ICD-10-CM | POA: Diagnosis present

## 2019-04-05 DIAGNOSIS — J9621 Acute and chronic respiratory failure with hypoxia: Secondary | ICD-10-CM | POA: Diagnosis present

## 2019-04-05 DIAGNOSIS — E785 Hyperlipidemia, unspecified: Secondary | ICD-10-CM | POA: Diagnosis present

## 2019-04-05 DIAGNOSIS — U071 COVID-19: Principal | ICD-10-CM

## 2019-04-05 DIAGNOSIS — I82452 Acute embolism and thrombosis of left peroneal vein: Secondary | ICD-10-CM | POA: Diagnosis present

## 2019-04-05 DIAGNOSIS — R7989 Other specified abnormal findings of blood chemistry: Secondary | ICD-10-CM | POA: Diagnosis not present

## 2019-04-05 DIAGNOSIS — M353 Polymyalgia rheumatica: Secondary | ICD-10-CM | POA: Diagnosis present

## 2019-04-05 DIAGNOSIS — R0600 Dyspnea, unspecified: Secondary | ICD-10-CM

## 2019-04-05 DIAGNOSIS — R0602 Shortness of breath: Secondary | ICD-10-CM | POA: Diagnosis not present

## 2019-04-05 DIAGNOSIS — Z7984 Long term (current) use of oral hypoglycemic drugs: Secondary | ICD-10-CM | POA: Diagnosis not present

## 2019-04-05 DIAGNOSIS — B009 Herpesviral infection, unspecified: Secondary | ICD-10-CM | POA: Diagnosis not present

## 2019-04-05 DIAGNOSIS — J9601 Acute respiratory failure with hypoxia: Secondary | ICD-10-CM

## 2019-04-05 DIAGNOSIS — R935 Abnormal findings on diagnostic imaging of other abdominal regions, including retroperitoneum: Secondary | ICD-10-CM | POA: Diagnosis present

## 2019-04-05 DIAGNOSIS — R590 Localized enlarged lymph nodes: Secondary | ICD-10-CM | POA: Diagnosis not present

## 2019-04-05 DIAGNOSIS — I10 Essential (primary) hypertension: Secondary | ICD-10-CM | POA: Diagnosis not present

## 2019-04-05 DIAGNOSIS — J1282 Pneumonia due to coronavirus disease 2019: Secondary | ICD-10-CM | POA: Diagnosis present

## 2019-04-05 DIAGNOSIS — J90 Pleural effusion, not elsewhere classified: Secondary | ICD-10-CM | POA: Diagnosis not present

## 2019-04-05 DIAGNOSIS — R918 Other nonspecific abnormal finding of lung field: Secondary | ICD-10-CM | POA: Diagnosis not present

## 2019-04-05 DIAGNOSIS — I82402 Acute embolism and thrombosis of unspecified deep veins of left lower extremity: Secondary | ICD-10-CM | POA: Diagnosis present

## 2019-04-05 DIAGNOSIS — I6782 Cerebral ischemia: Secondary | ICD-10-CM | POA: Diagnosis not present

## 2019-04-05 DIAGNOSIS — L98499 Non-pressure chronic ulcer of skin of other sites with unspecified severity: Secondary | ICD-10-CM | POA: Diagnosis not present

## 2019-04-05 DIAGNOSIS — Z885 Allergy status to narcotic agent status: Secondary | ICD-10-CM | POA: Diagnosis not present

## 2019-04-05 DIAGNOSIS — Z7952 Long term (current) use of systemic steroids: Secondary | ICD-10-CM | POA: Diagnosis not present

## 2019-04-05 DIAGNOSIS — E7800 Pure hypercholesterolemia, unspecified: Secondary | ICD-10-CM | POA: Diagnosis not present

## 2019-04-05 LAB — COMPREHENSIVE METABOLIC PANEL
ALT: 32 U/L (ref 0–44)
AST: 39 U/L (ref 15–41)
Albumin: 3.3 g/dL — ABNORMAL LOW (ref 3.5–5.0)
Alkaline Phosphatase: 72 U/L (ref 38–126)
Anion gap: 13 (ref 5–15)
BUN: 20 mg/dL (ref 8–23)
CO2: 22 mmol/L (ref 22–32)
Calcium: 8.4 mg/dL — ABNORMAL LOW (ref 8.9–10.3)
Chloride: 100 mmol/L (ref 98–111)
Creatinine, Ser: 1.16 mg/dL (ref 0.61–1.24)
GFR calc Af Amer: >60 mL/min (ref >60)
GFR calc non Af Amer: >60 mL/min (ref >60)
Glucose, Bld: 206 mg/dL — ABNORMAL HIGH (ref 70–99)
Potassium: 4.5 mmol/L (ref 3.5–5.1)
Sodium: 135 mmol/L (ref 135–145)
Total Bilirubin: 0.9 mg/dL (ref 0.3–1.2)
Total Protein: 6.9 g/dL (ref 6.5–8.1)

## 2019-04-05 LAB — CBC WITH DIFFERENTIAL/PLATELET
Abs Immature Granulocytes: 0.14 10*3/uL — ABNORMAL HIGH (ref 0.00–0.07)
Basophils Absolute: 0 10*3/uL (ref 0.0–0.1)
Basophils Relative: 0 %
Eosinophils Absolute: 0.1 10*3/uL (ref 0.0–0.5)
Eosinophils Relative: 0 %
HCT: 44 % (ref 39.0–52.0)
Hemoglobin: 13.9 g/dL (ref 13.0–17.0)
Immature Granulocytes: 1 %
Lymphocytes Relative: 2 %
Lymphs Abs: 0.3 10*3/uL — ABNORMAL LOW (ref 0.7–4.0)
MCH: 30.2 pg (ref 26.0–34.0)
MCHC: 31.6 g/dL (ref 30.0–36.0)
MCV: 95.4 fL (ref 80.0–100.0)
Monocytes Absolute: 0.7 10*3/uL (ref 0.1–1.0)
Monocytes Relative: 5 %
Neutro Abs: 12.7 10*3/uL — ABNORMAL HIGH (ref 1.7–7.7)
Neutrophils Relative %: 92 %
Platelets: 256 10*3/uL (ref 150–400)
RBC: 4.61 MIL/uL (ref 4.22–5.81)
RDW: 13.4 % (ref 11.5–15.5)
WBC: 13.9 10*3/uL — ABNORMAL HIGH (ref 4.0–10.5)
nRBC: 0 % (ref 0.0–0.2)

## 2019-04-05 LAB — TRIGLYCERIDES: Triglycerides: 322 mg/dL — ABNORMAL HIGH (ref <150)

## 2019-04-05 LAB — LACTATE DEHYDROGENASE: LDH: 251 U/L — ABNORMAL HIGH (ref 98–192)

## 2019-04-05 LAB — LACTIC ACID, PLASMA: Lactic Acid, Venous: 2.8 mmol/L (ref 0.5–1.9)

## 2019-04-05 LAB — D-DIMER, QUANTITATIVE: D-Dimer, Quant: 1.74 ug/mL-FEU — ABNORMAL HIGH (ref 0.00–0.50)

## 2019-04-05 LAB — PROCALCITONIN: Procalcitonin: 0.64 ng/mL

## 2019-04-05 LAB — C-REACTIVE PROTEIN: CRP: 29.9 mg/dL — ABNORMAL HIGH (ref <1.0)

## 2019-04-05 LAB — FIBRINOGEN: Fibrinogen: >800 mg/dL — ABNORMAL HIGH (ref 210–475)

## 2019-04-05 LAB — FERRITIN: Ferritin: 663 ng/mL — ABNORMAL HIGH (ref 24–336)

## 2019-04-05 LAB — POC SARS CORONAVIRUS 2 AG -  ED: SARS Coronavirus 2 Ag: POSITIVE — AB

## 2019-04-05 MED ORDER — DEXAMETHASONE SODIUM PHOSPHATE 10 MG/ML IJ SOLN
6.0000 mg | Freq: Once | INTRAMUSCULAR | Status: AC
  Administered 2019-04-05: 6 mg via INTRAVENOUS
  Filled 2019-04-05: qty 1

## 2019-04-05 MED ORDER — SODIUM CHLORIDE 0.9 % IV SOLN
200.0000 mg | Freq: Once | INTRAVENOUS | Status: DC
  Filled 2019-04-05: qty 40

## 2019-04-05 MED ORDER — SODIUM CHLORIDE 0.9 % IV SOLN
100.0000 mg | Freq: Every day | INTRAVENOUS | Status: AC
  Administered 2019-04-06 – 2019-04-09 (×4): 100 mg via INTRAVENOUS
  Filled 2019-04-05 (×4): qty 20

## 2019-04-05 MED ORDER — SODIUM CHLORIDE 0.9 % IV SOLN: 100.0000 mg | Freq: Every day | INTRAVENOUS | Status: DC

## 2019-04-05 MED ORDER — SODIUM CHLORIDE 0.9 % IV SOLN
200.0000 mg | Freq: Once | INTRAVENOUS | Status: AC
  Administered 2019-04-05: 200 mg via INTRAVENOUS
  Filled 2019-04-05: qty 200

## 2019-04-05 NOTE — ED Provider Notes (Signed)
[facility] DEPT Provider Note   CSN: [id number] Arrival date & time: 04/05/19  1512     History Chief Complaint  Patient presents with  . Covid positive  . low O2    Dustin Diaz is a 74 y.o. male.  The history is provided by the patient.  URI Presenting symptoms: congestion, cough and fever   Presenting symptoms: no ear pain and no sore throat   Severity:  Moderate Onset quality:  Gradual Duration:  8 days Timing:  Intermittent Progression:  Waxing and waning Chronicity:  New Relieved by:  Nothing Worsened by:  Nothing Associated symptoms: arthralgias and myalgias   Risk factors: sick contacts (Patient tested positive for covid a few days ago, has been feeling sick for 8 days. Oxygen in the 80s today. )        Past Medical History:  Diagnosis Date  . Diabetes mellitus without complication ([place])   . High cholesterol   . Hyperlipemia   . Hypertension   . Polymyalgia rheumatica [facility])     Patient Active Problem List   Diagnosis Date Noted  . Hypertension 07/24/2012  . Diabetes ([place]) 07/24/2012  . High cholesterol 07/24/2012  . Polymyalgia rheumatica ([place]) 07/24/2012  . 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 APPENDECTOMY  1956  . BACK SURGERY  1988  . SKIN SURGERY  2012   melanoma removal  . TONSILLECTOMY         Family History  Problem Relation Age of Onset  . Heart failure Father     Social History   Tobacco Use  . Smoking status: Never Smoker  . Smokeless tobacco: Never Used  Substance Use Topics  . Alcohol use: Yes    Comment: occ: 1 beer monthly  . Drug use: No    Home Medications Prior to Admission medications   Medication Sig Start Date End Date Taking? Authorizing Provider  aspirin 81 MG tablet Take 81 mg by mouth daily.    [provider]  atorvastatin (LIPITOR) 10 MG tablet Take 10 mg by mouth daily.    [provider]  fluticasone (FLONASE) 50 MCG/ACT nasal spray Place 2 sprays into the nose daily.    [provider]  glimepiride (AMARYL) 4 MG tablet Take 4 mg by mouth daily before breakfast.    [provider]  metformin (FORTAMET) 1000 MG (OSM) 24 hr tablet Take 500 mg by mouth 2 (two) times daily with a meal.     [provider]  pioglitazone (ACTOS) 45 MG tablet Take 45 mg by mouth daily.    [provider]  predniSONE (DELTASONE) 1 MG tablet Take 4 mg by mouth daily.     [provider]  valsartan (DIOVAN) 160 MG tablet Take 160 mg by mouth daily.    [provider]    Allergies    Codeine  Review of Systems   Review of Systems  Constitutional: Positive for fever. Negative for chills.  HENT: Positive for congestion. Negative for ear pain and sore throat.   Eyes: Negative for pain and visual disturbance.  Respiratory: Positive for cough and shortness of breath.   Cardiovascular: Negative for chest pain and palpitations.  Gastrointestinal: Negative for abdominal pain and vomiting.  Genitourinary: Negative for dysuria and hematuria.  Musculoskeletal: Positive for arthralgias and myalgias. Negative for back pain.  Skin: Negative for color change and rash.  Neurological: Negative for  seizures and syncope.  All other systems reviewed and are negative.   Physical Exam Updated Vital Signs  ED Triage Vitals  Enc Vitals Group     BP 04/05/19 1528 (!) 131/59     Pulse Rate 04/05/19 1528 74     Resp 04/05/19 1528 19     Temp 04/05/19 1538 98.7 F (37.1 C)     Temp Source 04/05/19 1538 Oral     SpO2 04/05/19 1528 (!) 79 %     Weight --      Height --      Head Circumference --      Peak Flow --      Pain Score --      Pain Loc --      Pain Edu? --      Excl. in [place]? --     Physical Exam Vitals and nursing note reviewed.  Constitutional:      General: He is not in acute distress.    Appearance: He is well-developed. He  is ill-appearing.  HENT:     Head: Normocephalic and atraumatic.     Nose: Nose normal.     Mouth/Throat:     Mouth: Mucous membranes are moist.  Eyes:     Extraocular Movements: Extraocular movements intact.     Conjunctiva/sclera: Conjunctivae normal.     Pupils: Pupils are equal, round, and reactive to light.  Cardiovascular:     Rate and Rhythm: Normal rate and regular rhythm.     Pulses: Normal pulses.     Heart sounds: Normal heart sounds. No murmur.  Pulmonary:     Effort: No respiratory distress.     Breath sounds: No rhonchi.     Comments: Coarse breath sounds, mild increase work of breathing Abdominal:     General: There is no distension.     Palpations: Abdomen is soft.     Tenderness: There is no abdominal tenderness.  Musculoskeletal:     Cervical back: Normal range of motion and neck supple.  Skin:    General: Skin is warm and dry.     Capillary Refill: Capillary refill takes less than 2 seconds.  Neurological:     General: No focal deficit present.     Mental Status: He is alert.  Psychiatric:        Mood and Affect: Mood normal.     ED Results / Procedures / Treatments   Labs (all labs ordered are listed, but only abnormal results are displayed) Labs Reviewed  CULTURE, BLOOD (ROUTINE X 2)  CULTURE, BLOOD (ROUTINE X 2)  LACTIC ACID, PLASMA  LACTIC ACID, PLASMA  CBC WITH DIFFERENTIAL/PLATELET  COMPREHENSIVE METABOLIC PANEL  D-DIMER, QUANTITATIVE (NOT AT [facility])  PROCALCITONIN  LACTATE DEHYDROGENASE  FERRITIN  TRIGLYCERIDES  FIBRINOGEN  C-REACTIVE PROTEIN  URINALYSIS, ROUTINE W REFLEX MICROSCOPIC  POC SARS CORONAVIRUS 2 AG -  ED    EKG None  Radiology No results found.  Procedures .Critical Care Performed by: Lennice Sites, DO Authorized by: Lennice Sites, DO   Critical care provider statement:    Critical care time (minutes):  35   Critical care was necessary to treat or prevent imminent or life-threatening deterioration of the  following conditions:  Respiratory failure   Critical care was time spent personally by me on the following activities:  Blood draw for specimens, development of treatment plan with patient or surrogate, obtaining history from patient or surrogate, pulse oximetry, ordering and performing treatments and interventions, ordering and review  of laboratory studies, ordering and review of radiographic studies and examination of patient   I assumed direction of critical care for this patient from another provider in my specialty: no     (including critical care time)  Medications Ordered in ED Medications  dexamethasone (DECADRON) injection 6 mg (6 mg Intravenous Given 04/05/19 1629)    ED Course  I have reviewed the triage vital signs and the nursing notes.  Pertinent labs & imaging results that were available during my care of the patient were reviewed by me and considered in my medical decision making (see chart for details).    MDM Rules/Calculators/A&P  Yohan Neil Hilgers is a 74 year old male with history of diabetes, high cholesterol, hypertension who presents to the ED with shortness of breath, hypoxia in the setting of positive coronavirus.  Patient hypoxic to upper 70s upon arrival.  Was titrated to 5 L of oxygen to maintain saturations above 90%.  He has mild tachypnea.  Overall he appears to have improvement on oxygen with good work of breathing.  He is mentating well.  He states that he has been sick for about 8 days.  Had a positive test about 2 days ago.  Continues to have episodes of shortness of breath, cough, fever, chills.  Given hypoxia will need admission.  Will give IV Decadron.  Covid screening labs have been ordered.  Will get chest x-ray.  Anticipate admission to medicine for further care.  Patient awaiting blood work and chest x-ray.  To be admitted for respiratory failure secondary to Covid.  Handed off to oncoming ED staff with patient pending lab work and imaging.  This  chart was dictated using voice recognition software.  Despite best efforts to proofread,  errors can occur which can change the documentation meaning.    Final Clinical Impression(s) / ED Diagnoses Final diagnoses:  Acute respiratory failure with hypoxia [facility])  COVID-19    Rx / DC Orders ED Discharge Orders    None       Lennice Sites, DO 04/05/19 1635

## 2019-04-05 NOTE — ED Triage Notes (Signed)
Pt reports that Covid+. Reports O2 levels been low at home today. Dr Arbutus Ped office advised pt to go to ED.

## 2019-04-05 NOTE — H&P (Signed)
History and Physical   Dustin Diaz [id number] DOB: July 08, 1945 DOA: 04/05/2019  Referring MD/NP/PA: Dr. Ronnald Nian  PCP: Lavone Orn, MD   Outpatient Specialists: None  Patient coming from: Home  Chief Complaint: Shortness of breath  HPI: Dustin Diaz is a 74 y.o. male with medical history significant of diabetes, hypertension, hyperlipidemia, polymyalgia rheumatica who presents to the ER with shortness of breath and cough.  Symptoms have been going on for about a week.  Patient diagnosed with COVID-19 about 8 days ago and came in with significant shortness of breath and hypoxia.  Oxygen sats of 80% on arrival.  He has congestion cough and fever.  He also has some myalgias and arthralgias.  Patient was seen and evaluated.  Chest x-ray and CT angiogram of the chest confirmed bilateral infiltrates consistent with Covid pneumonia.  Patient is currently on 8 L of oxygen and is notably tachypneic.  He is being admitted to the hospital with moderate to severe COVID-19 pneumonia..  ED Course: Temperature 98.7 blood pressure 147/86 pulse 74 respirate of 38 oxygen sat 79% on room air.  White count 13.9 otherwise CBC and chemistry appear to be relatively normal.  LDH is 251 triglyceride 322 ferritin 663 CRP of 30 and procalcitonin 0.64.  D-dimer 1.74 fibrinogen more than 800.  Repeat COVID-19 testing today is positive.  Chest x-ray again showed bilateral infiltrates.  Patient will be admitted to [facility] due to high oxygen demand.  Review of Systems: As per HPI otherwise 10 point review of systems negative.    Past Medical History:  Diagnosis Date  . Diabetes mellitus without complication ([place])   . High cholesterol   . Hyperlipemia   . Hypertension   . Polymyalgia rheumatica (HCC)     Past Surgical History:  Procedure Laterality Date  . APPENDECTOMY  1956  . BACK SURGERY  1988  . SKIN SURGERY  2012   melanoma removal  . TONSILLECTOMY       reports that he has  never smoked. He has never used smokeless tobacco. He reports current alcohol use. He reports that he does not use drugs.  Allergies  Allergen Reactions  . Codeine     Family History  Problem Relation Age of Onset  . Heart failure Father      Prior to Admission medications   Medication Sig Start Date End Date Taking? Authorizing Provider  amLODipine-valsartan (EXFORGE) 5-160 MG tablet Take 1 tablet by mouth daily. 02/16/19  Yes [provider]  aspirin 81 MG tablet Take 81 mg by mouth daily.   Yes [provider]  atorvastatin (LIPITOR) 10 MG tablet Take 10 mg by mouth daily.   Yes [provider]  fluticasone (FLONASE) 50 MCG/ACT nasal spray Place 2 sprays into the nose daily as needed for allergies.    Yes [provider]  glimepiride (AMARYL) 2 MG tablet Take 2 mg by mouth every morning. 02/16/19  Yes [provider]  JARDIANCE 25 MG TABS tablet Take 25 mg by mouth every morning. 03/09/19  Yes [provider]  latanoprost (XALATAN) 0.005 % ophthalmic solution Place 1 drop into both eyes at bedtime. 03/14/19  Yes [provider]  metFORMIN (GLUCOPHAGE-XR) 500 MG 24 hr tablet Take 2,000 mg by mouth every morning. 02/16/19  Yes [provider]  pioglitazone (ACTOS) 45 MG tablet Take 45 mg by mouth daily.   Yes [provider]  predniSONE (DELTASONE) 1 MG tablet Take 3 mg by mouth daily.  Yes [provider]  tamsulosin (FLOMAX) 0.4 MG CAPS capsule Take 0.4 mg by mouth at bedtime. 03/22/19   [provider]    Physical Exam: Vitals:   04/05/19 1538 04/05/19 1800 04/05/19 1830 04/05/19 1900  BP:  137/65 136/68 119/62  Pulse:  71 70 65  Resp:  (!) 38 (!) 28 (!) 36  Temp: 98.7 F (37.1 C)     TempSrc: Oral     SpO2:  91% 91% 90%      Constitutional: Acutely ill looking, in mild distress Vitals:   04/05/19 1538 04/05/19 1800 04/05/19 1830 04/05/19 1900  BP:  137/65 136/68  119/62  Pulse:  71 70 65  Resp:  (!) 38 (!) 28 (!) 36  Temp: 98.7 F (37.1 C)     TempSrc: Oral     SpO2:  91% 91% 90%   Eyes: PERRL, lids and conjunctivae normal ENMT: Mucous membranes are dry. Posterior pharynx clear of any exudate or lesions.Normal dentition.  Neck: normal, supple, no masses, no thyromegaly Respiratory: Decreased air entry bilaterally with some rhonchi and crackles, mild expiratory wheeze. Normal respiratory effort. No accessory muscle use.  Cardiovascular: Tachycardic, no murmurs / rubs / gallops. No extremity edema. 2+ pedal pulses. No carotid bruits.  Abdomen: no tenderness, no masses palpated. No hepatosplenomegaly. Bowel sounds positive.  Musculoskeletal: no clubbing / cyanosis. No joint deformity upper and lower extremities. Good ROM, no contractures. Normal muscle tone.  Skin: no rashes, lesions, ulcers. No induration Neurologic: CN 2-12 grossly intact. Sensation intact, DTR normal. Strength 5/5 in all 4.  Psychiatric: Normal judgment and insight. Alert and oriented x 3. Normal mood.     Labs on Admission: I have personally reviewed following labs and imaging studies  CBC: Recent Labs  Lab 04/05/19 1609  WBC 13.9*  NEUTROABS 12.7*  HGB 13.9  HCT 44.0  MCV 95.4  PLT 123456   Basic Metabolic Panel: Recent Labs  Lab 04/05/19 1609  NA 135  K 4.5  CL 100  CO2 22  GLUCOSE 206*  BUN 20  CREATININE 1.16  CALCIUM 8.4*   GFR: CrCl cannot be calculated (Unknown ideal weight.). Liver Function Tests: Recent Labs  Lab 04/05/19 1609  AST 39  ALT 32  ALKPHOS 72  BILITOT 0.9  PROT 6.9  ALBUMIN 3.3*   No results for input(s): LIPASE, AMYLASE in the last 168 hours. No results for input(s): AMMONIA in the last 168 hours. Coagulation Profile: No results for input(s): INR, PROTIME in the last 168 hours. Cardiac Enzymes: No results for input(s): CKTOTAL, CKMB, CKMBINDEX, TROPONINI in the last 168 hours. BNP (last 3 results) No results for input(s):  PROBNP in the last 8760 hours. HbA1C: No results for input(s): HGBA1C in the last 72 hours. CBG: No results for input(s): GLUCAP in the last 168 hours. Lipid Profile: Recent Labs    04/05/19 1609  TRIG 322*   Thyroid Function Tests: No results for input(s): TSH, T4TOTAL, FREET4, T3FREE, THYROIDAB in the last 72 hours. Anemia Panel: Recent Labs    04/05/19 1609  FERRITIN 663*   Urine analysis: No results found for: COLORURINE, APPEARANCEUR, LABSPEC, PHURINE, GLUCOSEU, HGBUR, BILIRUBINUR, KETONESUR, PROTEINUR, UROBILINOGEN, NITRITE, LEUKOCYTESUR Sepsis Labs: @LABRCNTIP (procalcitonin:4,lacticidven:4) )No results found for this or any previous visit (from the past 240 hour(s)).   Radiological Exams on Admission: DG Chest Port 1 View  Result Date: 04/05/2019 CLINICAL DATA:  COVID positive low oxygen level EXAM: PORTABLE CHEST 1 VIEW COMPARISON:  None. FINDINGS: Bilateral ground-glass opacities  and consolidations. No pleural effusion. Normal heart size. Aortic atherosclerosis. No pneumothorax. IMPRESSION: Bilateral ground-glass opacities and consolidations consistent with bilateral pneumonia. Electronically Signed   By: Donavan Foil M.D.   On: 04/05/2019 16:47    EKG: Independently reviewed.  It shows sinus tachycardia no significant ST change  Assessment/Plan Principal Problem:   Pneumonia due to COVID-19 virus Active Problems:   Hypertension   Diabetes ([place])   High cholesterol   Polymyalgia rheumatica ([place])     #1 acute respiratory failure with hypoxemia: Secondary to COVID-19 pneumonia.  Patient will be admitted to the [facility].  Currently on 8 L.  Initiate dexamethasone, remdesivir, Actemra, IV Rocephin and Zithromax as well as oxygen.  Daily CRP ferritin among other things.  Primary team to adjust medications.  #2 COVID-19 pneumonia: We'll proceed as above.  Patient is moderate to severe disease.  #3 diabetes: Initial glycemic control with some home regimen.   Blood sugar will be higher with Decadron on board.  #4 hypertension: Continue with home regimen of blood pressure medications.  #5 polymyalgia rheumatica: Patient chronically on steroids.  I will hold prednisone now since he is going to be on dexamethasone.  Resume dexamethasone when cleared.  #6 hyperlipidemia: Continue statin   DVT prophylaxis: Lovenox Code Status: Full code Family Communication: Wife over the phone Disposition Plan: Home Consults called: None Admission status: Inpatient to [facility]  Severity of Illness: The appropriate patient status for this patient is INPATIENT. Inpatient status is judged to be reasonable and necessary in order to provide the required intensity of service to ensure the patient's safety. The patient's presenting symptoms, physical exam findings, and initial radiographic and laboratory data in the context of their chronic comorbidities is felt to place them at high risk for further clinical deterioration. Furthermore, it is not anticipated that the patient will be medically stable for discharge from the hospital within 2 midnights of admission. The following factors support the patient status of inpatient.   " The patient's presenting symptoms include shortness of breath. " The worrisome physical exam findings include bilateral coarse breath sounds. " The initial radiographic and laboratory data are worrisome because of bilateral infiltrates. " The chronic co-morbidities include diabetes with hypertension.   * I certify that at the point of admission it is my clinical judgment that the patient will require inpatient hospital care spanning beyond 2 midnights from the point of admission due to high intensity of service, high risk for further deterioration and high frequency of surveillance required.Barbette Merino MD [facility] Pager [phone]  If 7PM-7AM, please contact night-coverage [email or web address] Password  [facility]  04/05/2019, 7:17 PM

## 2019-04-05 NOTE — ED Notes (Signed)
Rich Yaeger, wife, [phone], would like an update.

## 2019-04-06 DIAGNOSIS — I82452 Acute embolism and thrombosis of left peroneal vein: Secondary | ICD-10-CM | POA: Diagnosis not present

## 2019-04-06 DIAGNOSIS — U071 COVID-19: Secondary | ICD-10-CM | POA: Diagnosis not present

## 2019-04-06 DIAGNOSIS — J1282 Pneumonia due to Coronavirus disease 2019: Secondary | ICD-10-CM | POA: Diagnosis not present

## 2019-04-06 DIAGNOSIS — I10 Essential (primary) hypertension: Secondary | ICD-10-CM | POA: Diagnosis not present

## 2019-04-06 DIAGNOSIS — D849 Immunodeficiency, unspecified: Secondary | ICD-10-CM | POA: Diagnosis not present

## 2019-04-06 DIAGNOSIS — M353 Polymyalgia rheumatica: Secondary | ICD-10-CM | POA: Diagnosis not present

## 2019-04-06 DIAGNOSIS — J9621 Acute and chronic respiratory failure with hypoxia: Secondary | ICD-10-CM | POA: Diagnosis not present

## 2019-04-06 DIAGNOSIS — E119 Type 2 diabetes mellitus without complications: Secondary | ICD-10-CM | POA: Diagnosis not present

## 2019-04-06 DIAGNOSIS — E7800 Pure hypercholesterolemia, unspecified: Secondary | ICD-10-CM | POA: Diagnosis not present

## 2019-04-06 DIAGNOSIS — E785 Hyperlipidemia, unspecified: Secondary | ICD-10-CM | POA: Diagnosis not present

## 2019-04-06 LAB — CBC WITH DIFFERENTIAL/PLATELET
Abs Immature Granulocytes: 0.18 10*3/uL — ABNORMAL HIGH (ref 0.00–0.07)
Basophils Absolute: 0 10*3/uL (ref 0.0–0.1)
Basophils Relative: 0 %
Eosinophils Absolute: 0 10*3/uL (ref 0.0–0.5)
Eosinophils Relative: 0 %
HCT: 42.5 % (ref 39.0–52.0)
Hemoglobin: 14.1 g/dL (ref 13.0–17.0)
Immature Granulocytes: 1 %
Lymphocytes Relative: 3 %
Lymphs Abs: 0.5 10*3/uL — ABNORMAL LOW (ref 0.7–4.0)
MCH: 30.7 pg (ref 26.0–34.0)
MCHC: 33.2 g/dL (ref 30.0–36.0)
MCV: 92.4 fL (ref 80.0–100.0)
Monocytes Absolute: 0.5 10*3/uL (ref 0.1–1.0)
Monocytes Relative: 3 %
Neutro Abs: 16.2 10*3/uL — ABNORMAL HIGH (ref 1.7–7.7)
Neutrophils Relative %: 93 %
Platelets: 327 10*3/uL (ref 150–400)
RBC: 4.6 MIL/uL (ref 4.22–5.81)
RDW: 13.6 % (ref 11.5–15.5)
WBC: 17.4 10*3/uL — ABNORMAL HIGH (ref 4.0–10.5)
nRBC: 0 % (ref 0.0–0.2)

## 2019-04-06 LAB — COMPREHENSIVE METABOLIC PANEL
ALT: 31 U/L (ref 0–44)
AST: 30 U/L (ref 15–41)
Albumin: 2.8 g/dL — ABNORMAL LOW (ref 3.5–5.0)
Alkaline Phosphatase: 73 U/L (ref 38–126)
Anion gap: 14 (ref 5–15)
BUN: 25 mg/dL — ABNORMAL HIGH (ref 8–23)
CO2: 20 mmol/L — ABNORMAL LOW (ref 22–32)
Calcium: 8.1 mg/dL — ABNORMAL LOW (ref 8.9–10.3)
Chloride: 104 mmol/L (ref 98–111)
Creatinine, Ser: 1.02 mg/dL (ref 0.61–1.24)
GFR calc Af Amer: >60 mL/min (ref >60)
GFR calc non Af Amer: >60 mL/min (ref >60)
Glucose, Bld: 144 mg/dL — ABNORMAL HIGH (ref 70–99)
Potassium: 4.4 mmol/L (ref 3.5–5.1)
Sodium: 138 mmol/L (ref 135–145)
Total Bilirubin: 0.7 mg/dL (ref 0.3–1.2)
Total Protein: 6.4 g/dL — ABNORMAL LOW (ref 6.5–8.1)

## 2019-04-06 LAB — FERRITIN: Ferritin: 872 ng/mL — ABNORMAL HIGH (ref 24–336)

## 2019-04-06 LAB — C-REACTIVE PROTEIN: CRP: 36.4 mg/dL — ABNORMAL HIGH (ref <1.0)

## 2019-04-06 LAB — GLUCOSE, CAPILLARY
Glucose-Capillary: 129 mg/dL — ABNORMAL HIGH (ref 70–99)
Glucose-Capillary: 110 mg/dL — ABNORMAL HIGH (ref 70–99)
Glucose-Capillary: 195 mg/dL — ABNORMAL HIGH (ref 70–99)
Glucose-Capillary: 251 mg/dL — ABNORMAL HIGH (ref 70–99)

## 2019-04-06 LAB — ABO/RH: ABO/RH(D): O POS

## 2019-04-06 LAB — HEMOGLOBIN A1C
Hgb A1c MFr Bld: 7.1 % — ABNORMAL HIGH (ref 4.8–5.6)
Mean Plasma Glucose: 157.07 mg/dL

## 2019-04-06 LAB — D-DIMER, QUANTITATIVE: D-Dimer, Quant: 1.85 ug/mL-FEU — ABNORMAL HIGH (ref 0.00–0.50)

## 2019-04-06 MED ORDER — ONDANSETRON HCL 4 MG PO TABS: 4.0000 mg | ORAL_TABLET | Freq: Four times a day (QID) | ORAL | Status: DC | PRN

## 2019-04-06 MED ORDER — INSULIN ASPART 100 UNIT/ML ~~LOC~~ SOLN
0.0000 [IU] | Freq: Every day | SUBCUTANEOUS | Status: DC
  Administered 2019-04-06: 22:00:00 3 [IU] via SUBCUTANEOUS
  Administered 2019-04-08 – 2019-04-09 (×2): 2 [IU] via SUBCUTANEOUS
  Administered 2019-04-10: 5 [IU] via SUBCUTANEOUS
  Administered 2019-04-11 – 2019-04-20 (×3): 2 [IU] via SUBCUTANEOUS
  Administered 2019-04-22 – 2019-04-25 (×2): 3 [IU] via SUBCUTANEOUS
  Administered 2019-04-26: 2 [IU] via SUBCUTANEOUS
  Administered 2019-04-27: 4 [IU] via SUBCUTANEOUS

## 2019-04-06 MED ORDER — GLIMEPIRIDE 2 MG PO TABS
2.0000 mg | ORAL_TABLET | Freq: Every day | ORAL | Status: DC
  Administered 2019-04-06 – 2019-04-11 (×6): 2 mg via ORAL
  Filled 2019-04-06 (×7): qty 1

## 2019-04-06 MED ORDER — FLUTICASONE PROPIONATE 50 MCG/ACT NA SUSP
2.0000 | Freq: Every day | NASAL | Status: DC | PRN
  Filled 2019-04-06: qty 16

## 2019-04-06 MED ORDER — ATORVASTATIN CALCIUM 10 MG PO TABS
10.0000 mg | ORAL_TABLET | Freq: Every day | ORAL | Status: DC
  Administered 2019-04-06 – 2019-05-03 (×28): 10 mg via ORAL
  Filled 2019-04-06 (×28): qty 1

## 2019-04-06 MED ORDER — HYDROCOD POLST-CPM POLST ER 10-8 MG/5ML PO SUER: 5.0000 mL | Freq: Two times a day (BID) | ORAL | Status: DC | PRN

## 2019-04-06 MED ORDER — LIP MEDEX EX OINT
TOPICAL_OINTMENT | CUTANEOUS | Status: DC | PRN
  Filled 2019-04-06 (×2): qty 7

## 2019-04-06 MED ORDER — TOCILIZUMAB 400 MG/20ML IV SOLN
600.0000 mg | Freq: Once | INTRAVENOUS | Status: AC
  Administered 2019-04-06: 07:00:00 600 mg via INTRAVENOUS
  Filled 2019-04-06: qty 10

## 2019-04-06 MED ORDER — INSULIN ASPART 100 UNIT/ML ~~LOC~~ SOLN
0.0000 [IU] | Freq: Three times a day (TID) | SUBCUTANEOUS | Status: DC
  Administered 2019-04-06: 3 [IU] via SUBCUTANEOUS
  Administered 2019-04-06 – 2019-04-07 (×2): 4 [IU] via SUBCUTANEOUS
  Administered 2019-04-07: 11 [IU] via SUBCUTANEOUS
  Administered 2019-04-07 – 2019-04-08 (×2): 4 [IU] via SUBCUTANEOUS
  Administered 2019-04-08 (×2): 3 [IU] via SUBCUTANEOUS
  Administered 2019-04-09 (×2): 7 [IU] via SUBCUTANEOUS
  Administered 2019-04-09 – 2019-04-10 (×3): 3 [IU] via SUBCUTANEOUS
  Administered 2019-04-10: 11 [IU] via SUBCUTANEOUS
  Administered 2019-04-11: 4 [IU] via SUBCUTANEOUS
  Administered 2019-04-11: 3 [IU] via SUBCUTANEOUS
  Administered 2019-04-11: 7 [IU] via SUBCUTANEOUS
  Administered 2019-04-12: 08:00:00 4 [IU] via SUBCUTANEOUS
  Administered 2019-04-12: 7 [IU] via SUBCUTANEOUS
  Administered 2019-04-12: 4 [IU] via SUBCUTANEOUS
  Administered 2019-04-13: 3 [IU] via SUBCUTANEOUS
  Administered 2019-04-13: 7 [IU] via SUBCUTANEOUS
  Administered 2019-04-13 – 2019-04-14 (×3): 4 [IU] via SUBCUTANEOUS
  Administered 2019-04-15: 3 [IU] via SUBCUTANEOUS
  Administered 2019-04-15: 7 [IU] via SUBCUTANEOUS
  Administered 2019-04-15: 12:00:00 4 [IU] via SUBCUTANEOUS
  Administered 2019-04-16: 11 [IU] via SUBCUTANEOUS
  Administered 2019-04-16: 3 [IU] via SUBCUTANEOUS
  Administered 2019-04-17: 7 [IU] via SUBCUTANEOUS

## 2019-04-06 MED ORDER — TOCILIZUMAB 400 MG/20ML IV SOLN: 8.0000 mg/kg | Freq: Once | INTRAVENOUS | Status: DC

## 2019-04-06 MED ORDER — SODIUM CHLORIDE 0.9 % IV SOLN
500.0000 mg | INTRAVENOUS | Status: DC
  Administered 2019-04-06: 06:00:00 500 mg via INTRAVENOUS
  Filled 2019-04-06: qty 500

## 2019-04-06 MED ORDER — PIOGLITAZONE HCL 45 MG PO TABS
45.0000 mg | ORAL_TABLET | Freq: Every day | ORAL | Status: DC
  Administered 2019-04-06 – 2019-04-17 (×12): 45 mg via ORAL
  Filled 2019-04-06 (×12): qty 1

## 2019-04-06 MED ORDER — CANAGLIFLOZIN 100 MG PO TABS
100.0000 mg | ORAL_TABLET | Freq: Every day | ORAL | Status: DC
  Administered 2019-04-06 – 2019-04-11 (×6): 100 mg via ORAL
  Filled 2019-04-06 (×7): qty 1

## 2019-04-06 MED ORDER — LATANOPROST 0.005 % OP SOLN
1.0000 [drp] | Freq: Every day | OPHTHALMIC | Status: DC
  Administered 2019-04-06 – 2019-05-02 (×27): 1 [drp] via OPHTHALMIC
  Filled 2019-04-06: qty 2.5

## 2019-04-06 MED ORDER — SODIUM CHLORIDE 0.9 % IV SOLN
1.0000 g | INTRAVENOUS | Status: DC
  Administered 2019-04-06: 06:00:00 1 g via INTRAVENOUS
  Filled 2019-04-06: qty 10

## 2019-04-06 MED ORDER — DEXAMETHASONE SODIUM PHOSPHATE 10 MG/ML IJ SOLN
6.0000 mg | INTRAMUSCULAR | Status: DC
  Administered 2019-04-06 – 2019-04-10 (×5): 6 mg via INTRAVENOUS
  Filled 2019-04-06 (×5): qty 1

## 2019-04-06 MED ORDER — ASCORBIC ACID 500 MG PO TABS
500.0000 mg | ORAL_TABLET | Freq: Every day | ORAL | Status: DC
  Administered 2019-04-06 – 2019-05-03 (×28): 500 mg via ORAL
  Filled 2019-04-06 (×28): qty 1

## 2019-04-06 MED ORDER — ACETAMINOPHEN 325 MG PO TABS
650.0000 mg | ORAL_TABLET | Freq: Four times a day (QID) | ORAL | Status: DC | PRN
  Administered 2019-04-21: 650 mg via ORAL
  Filled 2019-04-06: qty 2

## 2019-04-06 MED ORDER — ONDANSETRON HCL 4 MG/2ML IJ SOLN: 4.0000 mg | Freq: Four times a day (QID) | INTRAMUSCULAR | Status: DC | PRN

## 2019-04-06 MED ORDER — TOCILIZUMAB 400 MG/20ML IV SOLN
620.0000 mg | Freq: Once | INTRAVENOUS | Status: DC
  Filled 2019-04-06: qty 31

## 2019-04-06 MED ORDER — ASPIRIN EC 81 MG PO TBEC
81.0000 mg | DELAYED_RELEASE_TABLET | Freq: Every day | ORAL | Status: DC
  Administered 2019-04-06 – 2019-04-11 (×6): 81 mg via ORAL
  Filled 2019-04-06 (×6): qty 1

## 2019-04-06 MED ORDER — ENOXAPARIN SODIUM 40 MG/0.4ML ~~LOC~~ SOLN
40.0000 mg | SUBCUTANEOUS | Status: DC
  Administered 2019-04-06 – 2019-04-09 (×4): 40 mg via SUBCUTANEOUS
  Filled 2019-04-06 (×4): qty 0.4

## 2019-04-06 MED ORDER — IRBESARTAN 150 MG PO TABS
150.0000 mg | ORAL_TABLET | Freq: Every day | ORAL | Status: DC
  Administered 2019-04-06 – 2019-04-17 (×12): 150 mg via ORAL
  Filled 2019-04-06 (×12): qty 1

## 2019-04-06 MED ORDER — AMLODIPINE BESYLATE-VALSARTAN 5-160 MG PO TABS: 1.0000 | ORAL_TABLET | Freq: Every day | ORAL | Status: DC

## 2019-04-06 MED ORDER — IPRATROPIUM-ALBUTEROL 20-100 MCG/ACT IN AERS
1.0000 | INHALATION_SPRAY | Freq: Four times a day (QID) | RESPIRATORY_TRACT | Status: DC
  Administered 2019-04-06 – 2019-05-03 (×92): 1 via RESPIRATORY_TRACT
  Filled 2019-04-06: qty 4

## 2019-04-06 MED ORDER — GUAIFENESIN-DM 100-10 MG/5ML PO SYRP
10.0000 mL | ORAL_SOLUTION | ORAL | Status: DC | PRN
  Filled 2019-04-06: qty 10

## 2019-04-06 MED ORDER — ZINC SULFATE 220 (50 ZN) MG PO CAPS
220.0000 mg | ORAL_CAPSULE | Freq: Every day | ORAL | Status: DC
  Administered 2019-04-06 – 2019-05-03 (×28): 220 mg via ORAL
  Filled 2019-04-06 (×29): qty 1

## 2019-04-06 MED ORDER — AMLODIPINE BESYLATE 5 MG PO TABS
5.0000 mg | ORAL_TABLET | Freq: Every day | ORAL | Status: DC
  Administered 2019-04-06 – 2019-04-16 (×11): 5 mg via ORAL
  Filled 2019-04-06 (×12): qty 1

## 2019-04-06 NOTE — Plan of Care (Signed)
No acute events during this shift. Pt on 9L HFNC. No complaints of SOB. No complaints of pain. Pt is ambulatory. VSS and WDL for this pt. Continue to monitor

## 2019-04-06 NOTE — Progress Notes (Signed)
This nurse placed call to wife Velva Harman and update was given.

## 2019-04-06 NOTE — Progress Notes (Signed)
Initial Nutrition Assessment  DOCUMENTATION CODES:   Not applicable  INTERVENTION:   Pt receiving Hormel Shake daily with Breakfast which provides 520 kcals and 22 g of protein and Magic cup BID with lunch and dinner, each supplement provides 290 kcal and 9 grams of protein, automatically on meal trays to optimize nutritional intake.   Encourage PO intake    NUTRITION DIAGNOSIS:   Increased nutrient needs related to catabolic [id number] PNA) as evidenced by estimated needs.  GOAL:   Patient will meet greater than or equal to 90% of their needs  MONITOR:   PO intake, Supplement acceptance  REASON FOR ASSESSMENT:   Malnutrition Screening Tool    ASSESSMENT:   Pt with PMH of DM, HTN, HLD, and polymyalgia rheumatica who is admitted with SOB x 1 week when dx with COVID-19.   Per RN pt is eating and drinking well.  Pt on 9L O2 via HFNC  Medications reviewed and include: vitamin C, decadron, amaryl, SSI with meals, zinc  Remdesivir  Labs reviewed: CBG's: 157 Lab Results  Component Value Date   HGBA1C 7.1 (H) 04/06/2019     NUTRITION - FOCUSED PHYSICAL EXAM:  Deferred   Diet Order:   Diet Order            Diet heart healthy/carb modified Room service appropriate? Yes; Fluid consistency: Thin  Diet effective now              EDUCATION NEEDS:   Not appropriate for education at this time  Skin:  Skin Assessment: Reviewed RN Assessment  Last BM:  1/14  Height:   Ht Readings from Last 1 Encounters:  04/06/19 5\' 10"  (1.778 m)    Weight:   Wt Readings from Last 1 Encounters:  04/06/19 78 kg    Ideal Body Weight:  75.4 kg  BMI:  Body mass index is 24.67 kg/m.  Estimated Nutritional Needs:   Kcal:  1950-2300  Protein:  115-132 grams  Fluid:  >1.9 L/day  Maylon Peppers RD, LDN, CNSC [phone] Pager [phone] After Hours Pager

## 2019-04-06 NOTE — Progress Notes (Signed)
PROGRESS NOTE  Dustin Diaz [id number] DOB: 1945-08-02 DOA: 04/05/2019  Referring MD/NP/PA: Dr. Ronnald Nian  PCP: Lavone Orn, MD   Outpatient Specialists: None  Patient coming from: Home  Chief Complaint: Shortness of breath  HPI: Dustin Diaz is a 74 y.o. male with medical history significant of diabetes, hypertension, hyperlipidemia, polymyalgia rheumatica who presents to the ER with shortness of breath and cough.  Symptoms have been going on for about a week after a family gathering of more than 10 people on March 20, 2019.  Patient diagnosed with COVID-19 about 8 days ago and came in with significant shortness of breath and hypoxia.  Oxygen sats of 80% on arrival.  He has congestion cough and fever.  He also has some myalgias and arthralgias.  Patient was seen and evaluated.  Chest x-ray and CT angiogram of the chest confirmed bilateral infiltrates consistent with Covid pneumonia.  Patient is currently on 8 L of oxygen and is notably tachypneic.  He is being admitted to the hospital with moderate to severe COVID-19 pneumonia. In ED: Temperature 98.7 blood pressure 147/86 pulse 74 respirate of 38 oxygen sat 79% on room air.  White count 13.9 otherwise CBC and chemistry appear to be relatively normal.  LDH is 251 triglyceride 322 ferritin 663 CRP of 30 and procalcitonin 0.64.  D-dimer 1.74 fibrinogen more than 800.  Repeat COVID-19 testing today is positive.  Chest x-ray again showed bilateral infiltrates.  Patient will be admitted to [facility] due to high oxygen demand.  Subjective: No acute issues or events overnight, shortness of breath about the same as admission if not minimally improving, otherwise declines headache, fever, chills, nausea, vomiting, diarrhea, constipation.   Assessment/Plan Principal Problem:   Pneumonia due to COVID-19 virus Active Problems:   Hypertension   Diabetes ([place])   High cholesterol   Polymyalgia rheumatica (HCC)   Acute  hypoxic respiratory failure in the setting of COVID-19 pneumonia, POA  Patient meets criteria for severe disease SpO2: 91 % O2 Flow Rate (L/min): 9 L/min Recent Labs    04/05/19 1609 04/06/19 0534  DDIMER 1.74* 1.85*  FERRITIN 663*  --   LDH 251*  --   CRP 29.9*  --   Chest x-ray personally reviewed, right middle lobe and left lower lobe patchy opacification concerning for diffuse infiltrate Continue dexamethasone, remdesivir, IV Rocephin and Zithromax  Actemra discontinued at admission due to ongoing antibiotic use and concern for concurrent bacterial infection Procalcitonin noted to be low this morning, will discontinue IV antibiotics  Non-insulin-dependent diabetes mellitus type 2 diabetes:  Continue sliding scale insulin, hypoglycemic protocol  Hypertension: Continue with home regimen of blood pressure medications.  Polymyalgia rheumatica:  Patient chronically on steroids (prednisone) Continue dexamethasone now, transition back to home prednisone dose at discharge  Hyperlipidemia:  Continue statin   DVT prophylaxis: Lovenox Code Status: Full code Family Communication: Wife updated over the phone Disposition Plan: Home pending improvement in hypoxia and Covid symptoms as above Consults called: None Admission status: Inpatient, currently requires oxygen, IV medications and IV fluids.  Suspect patient will need 4 to 5 days of inpatient treatment to complete Remdesivir course, possibly longer if he remains markedly hypoxic.   Physical Exam: Vitals:   04/06/19 0045 04/06/19 0145 04/06/19 0400 04/06/19 0702  BP:  133/60 (!) 117/48 (!) 127/47  Pulse: (!) 59 66 62 77  Resp: (!) 23 20 (!) 22 (!) 22  Temp:  97.6 F (36.4 C) 98.6 F (37 C) 98.4 F (36.9  C)  TempSrc:  Oral Oral Oral  SpO2: 93% 91% 94% 91%  Weight:  78 kg    Height:  5\' 10"  (1.778 m)        Constitutional: Acutely ill looking, in mild distress Eyes: PERRL, lids and conjunctivae normal ENMT: Mucous  membranes are dry. Posterior pharynx clear of any exudate or lesions.Normal dentition.  Neck: normal, supple, no masses, no thyromegaly Respiratory: Decreased air entry bilaterally with some rhonchi and crackles, mild expiratory wheeze. Normal respiratory effort. No accessory muscle use.  Cardiovascular: Tachycardic, no murmurs / rubs / gallops. No extremity edema. 2+ pedal pulses. No carotid bruits.  Abdomen: no tenderness, no masses palpated. No hepatosplenomegaly. Bowel sounds positive.  Musculoskeletal: no clubbing / cyanosis. No joint deformity upper and lower extremities. Good ROM, no contractures. Normal muscle tone.  Skin: no rashes, lesions, ulcers. No induration Neurologic: CN 2-12 grossly intact. Sensation intact, DTR normal. Strength 5/5 in all 4.  Psychiatric: Normal judgment and insight. Alert and oriented x 3. Normal mood.     Labs on Admission: I have personally reviewed following labs and imaging studies  CBC: Recent Labs  Lab 04/05/19 1609 04/06/19 0534  WBC 13.9* 17.4*  NEUTROABS 12.7* 16.2*  HGB 13.9 14.1  HCT 44.0 42.5  MCV 95.4 92.4  PLT 256 [id number]   Basic Metabolic Panel: Recent Labs  Lab 04/05/19 1609 04/06/19 0534  NA 135 138  K 4.5 4.4  CL 100 104  CO2 22 20*  GLUCOSE 206* 144*  BUN 20 25*  CREATININE 1.16 1.02  CALCIUM 8.4* 8.1*   GFR: Estimated Creatinine Clearance: 67.6 mL/min (by C-G formula based on SCr of 1.02 mg/dL). Liver Function Tests: Recent Labs  Lab 04/05/19 1609 04/06/19 0534  AST 39 30  ALT 32 31  ALKPHOS 72 73  BILITOT 0.9 0.7  PROT 6.9 6.4*  ALBUMIN 3.3* 2.8*   No results for input(s): LIPASE, AMYLASE in the last 168 hours. No results for input(s): AMMONIA in the last 168 hours. Coagulation Profile: No results for input(s): INR, PROTIME in the last 168 hours. Cardiac Enzymes: No results for input(s): CKTOTAL, CKMB, CKMBINDEX, TROPONINI in the last 168 hours. BNP (last 3 results) No results for input(s): PROBNP in the  last 8760 hours. HbA1C: No results for input(s): HGBA1C in the last 72 hours. CBG: No results for input(s): GLUCAP in the last 168 hours. Lipid Profile: Recent Labs    04/05/19 1609  TRIG 322*   Thyroid Function Tests: No results for input(s): TSH, T4TOTAL, FREET4, T3FREE, THYROIDAB in the last 72 hours. Anemia Panel: Recent Labs    04/05/19 1609  FERRITIN 663*   Urine analysis: No results found for: COLORURINE, APPEARANCEUR, LABSPEC, PHURINE, GLUCOSEU, HGBUR, BILIRUBINUR, KETONESUR, PROTEINUR, UROBILINOGEN, NITRITE, LEUKOCYTESUR Sepsis Labs: @LABRCNTIP (procalcitonin:4,lacticidven:4) )No results found for this or any previous visit (from the past 240 hour(s)).   Radiological Exams on Admission: DG Chest Port 1 View  Result Date: 04/05/2019 CLINICAL DATA:  COVID positive low oxygen level EXAM: PORTABLE CHEST 1 VIEW COMPARISON:  None. FINDINGS: Bilateral ground-glass opacities and consolidations. No pleural effusion. Normal heart size. Aortic atherosclerosis. No pneumothorax. IMPRESSION: Bilateral ground-glass opacities and consolidations consistent with bilateral pneumonia. Electronically Signed   By: Donavan Foil M.D.   On: 04/05/2019 16:47   EKG: Independently reviewed.  It shows sinus tachycardia no significant ST change  [facility] Pager [phone]  If 7PM-7AM, please contact night-coverage [email or web address] Password [facility]  04/06/2019, 7:22 AM

## 2019-04-06 NOTE — Progress Notes (Signed)
Spoke with Velva Harman, the patient's wife and updated her on the patient's condition.  Answered all questions at this time.

## 2019-04-07 ENCOUNTER — Ambulatory Visit (HOSPITAL_COMMUNITY): Payer: Medicare HMO

## 2019-04-07 LAB — GLUCOSE, CAPILLARY
Glucose-Capillary: 160 mg/dL — ABNORMAL HIGH (ref 70–99)
Glucose-Capillary: 178 mg/dL — ABNORMAL HIGH (ref 70–99)
Glucose-Capillary: 267 mg/dL — ABNORMAL HIGH (ref 70–99)
Glucose-Capillary: 160 mg/dL — ABNORMAL HIGH (ref 70–99)

## 2019-04-07 LAB — CBC WITH DIFFERENTIAL/PLATELET
Abs Immature Granulocytes: 0.14 10*3/uL — ABNORMAL HIGH (ref 0.00–0.07)
Basophils Absolute: 0 10*3/uL (ref 0.0–0.1)
Basophils Relative: 0 %
Eosinophils Absolute: 0 10*3/uL (ref 0.0–0.5)
Eosinophils Relative: 0 %
HCT: 37.6 % — ABNORMAL LOW (ref 39.0–52.0)
Hemoglobin: 13 g/dL (ref 13.0–17.0)
Immature Granulocytes: 1 %
Lymphocytes Relative: 4 %
Lymphs Abs: 0.6 10*3/uL — ABNORMAL LOW (ref 0.7–4.0)
MCH: 30.7 pg (ref 26.0–34.0)
MCHC: 34.6 g/dL (ref 30.0–36.0)
MCV: 88.9 fL (ref 80.0–100.0)
Monocytes Absolute: 0.6 10*3/uL (ref 0.1–1.0)
Monocytes Relative: 4 %
Neutro Abs: 12.9 10*3/uL — ABNORMAL HIGH (ref 1.7–7.7)
Neutrophils Relative %: 91 %
Platelets: 362 10*3/uL (ref 150–400)
RBC: 4.23 MIL/uL (ref 4.22–5.81)
RDW: 13.5 % (ref 11.5–15.5)
WBC: 14.2 10*3/uL — ABNORMAL HIGH (ref 4.0–10.5)
nRBC: 0 % (ref 0.0–0.2)

## 2019-04-07 LAB — COMPREHENSIVE METABOLIC PANEL
ALT: 22 U/L (ref 0–44)
AST: 25 U/L (ref 15–41)
Albumin: 2.8 g/dL — ABNORMAL LOW (ref 3.5–5.0)
Alkaline Phosphatase: 83 U/L (ref 38–126)
Anion gap: 9 (ref 5–15)
BUN: 39 mg/dL — ABNORMAL HIGH (ref 8–23)
CO2: 23 mmol/L (ref 22–32)
Calcium: 8.2 mg/dL — ABNORMAL LOW (ref 8.9–10.3)
Chloride: 105 mmol/L (ref 98–111)
Creatinine, Ser: 0.93 mg/dL (ref 0.61–1.24)
GFR calc Af Amer: >60 mL/min (ref >60)
GFR calc non Af Amer: >60 mL/min (ref >60)
Glucose, Bld: 209 mg/dL — ABNORMAL HIGH (ref 70–99)
Potassium: 4.8 mmol/L (ref 3.5–5.1)
Sodium: 137 mmol/L (ref 135–145)
Total Bilirubin: 0.7 mg/dL (ref 0.3–1.2)
Total Protein: 6.1 g/dL — ABNORMAL LOW (ref 6.5–8.1)

## 2019-04-07 LAB — FERRITIN: Ferritin: 917 ng/mL — ABNORMAL HIGH (ref 24–336)

## 2019-04-07 LAB — C-REACTIVE PROTEIN: CRP: 25.4 mg/dL — ABNORMAL HIGH (ref <1.0)

## 2019-04-07 LAB — D-DIMER, QUANTITATIVE: D-Dimer, Quant: 1.71 ug/mL-FEU — ABNORMAL HIGH (ref 0.00–0.50)

## 2019-04-07 MED ORDER — TOCILIZUMAB 400 MG/20ML IV SOLN
600.0000 mg | Freq: Once | INTRAVENOUS | Status: AC
  Administered 2019-04-07: 09:00:00 600 mg via INTRAVENOUS
  Filled 2019-04-07: qty 10

## 2019-04-07 NOTE — Progress Notes (Signed)
Wife updated on patients status.

## 2019-04-07 NOTE — Plan of Care (Signed)
Needs are supported by staff and family

## 2019-04-07 NOTE — Progress Notes (Signed)
The patient has rested well. No complaints of pain or discomfort.

## 2019-04-07 NOTE — Progress Notes (Signed)
Made attempt to call patients wife, Dustin Diaz, but her line is busy and I was unable to leave a voice message. Will continue to try and reach her.

## 2019-04-07 NOTE — Progress Notes (Signed)
urinal

## 2019-04-07 NOTE — Progress Notes (Signed)
PROGRESS NOTE  Dustin Diaz [id number] DOB: [birth year] DOA: 04/05/2019  Referring MD/NP/PA: Dr. Ronnald Nian  PCP: Lavone Orn, MD   Outpatient Specialists: None  Patient coming from: Home  Chief Complaint: Shortness of breath  HPI: Dustin Diaz is a 74 y.o. male with medical history significant of diabetes, hypertension, hyperlipidemia, polymyalgia rheumatica who presents to the ER with shortness of breath and cough.  Symptoms have been going on for about a week after a family gathering of more than 10 people on March 20, 2019.  Patient diagnosed with COVID-19 about 8 days ago and came in with significant shortness of breath and hypoxia.  Oxygen sats of 80% on arrival. He has congestion cough and fever.  He also has some myalgias and arthralgias.  Patient was seen and evaluated. Chest x-ray and CT angiogram of the chest confirmed bilateral infiltrates consistent with Covid pneumonia. Patient is currently on 8 L of oxygen and is notably tachypneic. He is being admitted to the hospital with moderate to severe COVID-19 pneumonia. In ED: Temperature 98.7 blood pressure 147/86 pulse 74 respirate of 38 oxygen sat 79% on room air. White count 13.9 otherwise CBC and chemistry appear to be relatively normal. LDH is 251 triglyceride 322 ferritin 663 CRP of 30 and procalcitonin 0.64. D-dimer 1.74 fibrinogen more than 800. Repeat COVID-19 testing today is positive. Chest x-ray again showed bilateral infiltrates. Patient will be admitted to [facility] due to high oxygen demand.  Subjective: No acute issues or events overnight, shortness of breath about the same as admission if not minimally improving, otherwise declines headache, fever, chills, nausea, vomiting, diarrhea, constipation.   Assessment/Plan Principal Problem:   Pneumonia due to COVID-19 virus Active Problems:   Hypertension   Diabetes ([place])   High cholesterol   Polymyalgia rheumatica (HCC)   Acute hypoxic  respiratory failure in the setting of COVID-19 pneumonia, POA  Patient meets criteria for severe disease SpO2: (!) 87 % O2 Flow Rate (L/min): 12 L/min FiO2 (%): 100 % Recent Labs    04/05/19 1609 04/06/19 0534 04/07/19 0050  DDIMER 1.74* 1.85* 1.71*  FERRITIN 663* 872* 917*  LDH 251*  --   --   CRP 29.9* 36.4* 25.4*  Actemra x1 on 1/14 - repeat dose 1/16 given symptoms/oxygen/CRP levels Chest x-ray personally reviewed, right middle lobe and left lower lobe patchy opacification concerning for diffuse infiltrate. Continue dexamethasone, remdesivir, IV Rocephin and Zithromax  Procalcitonin noted to be low after admission - unlikely bacterial infection, will discontinue IV antibiotics  Non-insulin-dependent diabetes mellitus type 2 diabetes:  Continue sliding scale insulin, hypoglycemic protocol  Hypertension: Continue with home regimen of blood pressure medications.  Polymyalgia rheumatica:  Patient chronically on steroids (prednisone) Continue dexamethasone now, transition back to home prednisone dose at discharge  Leukemoid reaction in the setting of steroids as above  Continue to follow daily labs  hyperlipidemia:  Continue statin  DVT prophylaxis: Lovenox Code Status: Full code Family Communication: Wife updated over the phone Disposition Plan: Home pending improvement in hypoxia and Covid symptoms as above Consults called: None Admission status: Inpatient, currently requires oxygen, IV medications and IV fluids.  Suspect patient will need 4 to 5 days of inpatient treatment to complete Remdesivir course, possibly longer if he remains markedly hypoxic.   Physical Exam: Vitals:   04/06/19 1551 04/06/19 1953 04/07/19 0500 04/07/19 0720  BP: (!) 108/42 (!) 114/40 (!) 111/58 128/64  Pulse: 68   65  Resp: (!) 22     Temp:  98.6 F (37 C) 98 F (36.7 C) 98.2 F (36.8 C) 98 F (36.7 C)  TempSrc: Oral Oral Oral Oral  SpO2: 92%   (!) 87%  Weight:      Height:         General:  Pleasantly resting in bed, No acute distress.  Tolerating nasal cannula without respiratory distress HEENT:  Normocephalic atraumatic.  Sclerae nonicteric, noninjected.  Extraocular movements intact bilaterally. Neck:  Without mass or deformity.  Trachea is midline. Lungs: Bilateral diminished breath sounds, scant rhonchi left lower lobe otherwise without overt rales or wheeze Heart:  Regular rate and rhythm.  Without murmurs, rubs, or gallops. Abdomen:  Soft, nontender, nondistended.  Without guarding or rebound. Extremities: Without cyanosis, clubbing, edema, or obvious deformity. Vascular:  Dorsalis pedis and posterior tibial pulses palpable bilaterally. Skin:  Warm and dry, no erythema, no ulcerations.  Labs on Admission: I have personally reviewed following labs and imaging studies  CBC: Recent Labs  Lab 04/05/19 1609 04/06/19 0534 04/07/19 0050  WBC 13.9* 17.4* 14.2*  NEUTROABS 12.7* 16.2* 12.9*  HGB 13.9 14.1 13.0  HCT 44.0 42.5 37.6*  MCV 95.4 92.4 88.9  PLT 256 327 [id number]   Basic Metabolic Panel: Recent Labs  Lab 04/05/19 1609 04/06/19 0534 04/07/19 0050  NA 135 138 137  K 4.5 4.4 4.8  CL 100 104 105  CO2 22 20* 23  GLUCOSE 206* 144* 209*  BUN 20 25* 39*  CREATININE 1.16 1.02 0.93  CALCIUM 8.4* 8.1* 8.2*   GFR: Estimated Creatinine Clearance: 74.1 mL/min (by C-G formula based on SCr of 0.93 mg/dL). Liver Function Tests: Recent Labs  Lab 04/05/19 1609 04/06/19 0534 04/07/19 0050  AST 39 30 25  ALT 32 31 22  ALKPHOS 72 73 83  BILITOT 0.9 0.7 0.7  PROT 6.9 6.4* 6.1*  ALBUMIN 3.3* 2.8* 2.8*   No results for input(s): LIPASE, AMYLASE in the last 168 hours. No results for input(s): AMMONIA in the last 168 hours. Coagulation Profile: No results for input(s): INR, PROTIME in the last 168 hours. Cardiac Enzymes: No results for input(s): CKTOTAL, CKMB, CKMBINDEX, TROPONINI in the last 168 hours. BNP (last 3 results) No results for input(s):  PROBNP in the last 8760 hours. HbA1C: Recent Labs    04/06/19 0540  HGBA1C 7.1*   CBG: Recent Labs  Lab 04/06/19 0713 04/06/19 1123 04/06/19 1611 04/06/19 2104 04/07/19 0718  GLUCAP 129* 110* 195* 251* 160*   Lipid Profile: Recent Labs    04/05/19 1609  TRIG 322*   Thyroid Function Tests: No results for input(s): TSH, T4TOTAL, FREET4, T3FREE, THYROIDAB in the last 72 hours. Anemia Panel: Recent Labs    04/06/19 0534 04/07/19 0050  FERRITIN 872* 917*   Urine analysis: No results found for: COLORURINE, APPEARANCEUR, LABSPEC, PHURINE, GLUCOSEU, HGBUR, BILIRUBINUR, KETONESUR, PROTEINUR, UROBILINOGEN, NITRITE, LEUKOCYTESUR Sepsis Labs: @LABRCNTIP (procalcitonin:4,lacticidven:4) ) Recent Results (from the past 240 hour(s))  Blood Culture (routine x 2)     Status: None (Preliminary result)   Collection Time: 04/05/19  4:08 PM   Specimen: BLOOD LEFT HAND  Result Value Ref Range Status   Specimen Description   Final    BLOOD LEFT HAND Performed at [facility] [address], [place], [place] [address]    Special Requests   Final    BOTTLES DRAWN AEROBIC AND ANAEROBIC Blood Culture adequate volume Performed at [facility] [address], [place], [place] [address]    Culture   Final  NO GROWTH 2 DAYS Performed at [facility], [place] [address], [place], [place] [address]    Report Status PENDING  Incomplete  Blood Culture (routine x 2)     Status: None (Preliminary result)   Collection Time: 04/05/19  4:19 PM   Specimen: BLOOD LEFT HAND  Result Value Ref Range Status   Specimen Description   Final    BLOOD LEFT HAND Performed at [facility] [address] [place], [place] [address]    Special Requests   Final    BOTTLES DRAWN AEROBIC ONLY Blood Culture adequate volume Performed at [facility] [address], [place], [place] [address]    Culture   Final    NO GROWTH 2  DAYS Performed at [facility] [address], [place], [place] [address]    Report Status PENDING  Incomplete     Radiological Exams on Admission: DG Chest Port 1 View  Result Date: 04/05/2019 CLINICAL DATA:  COVID positive low oxygen level EXAM: PORTABLE CHEST 1 VIEW COMPARISON:  None. FINDINGS: Bilateral ground-glass opacities and consolidations. No pleural effusion. Normal heart size. Aortic atherosclerosis. No pneumothorax. IMPRESSION: Bilateral ground-glass opacities and consolidations consistent with bilateral pneumonia. Electronically Signed   By: Donavan Foil M.D.   On: 04/05/2019 16:47   EKG: Independently reviewed.  It shows sinus tachycardia no significant ST change  [facility] Pager [phone]  If 7PM-7AM, please contact night-coverage [email or web address] Password [facility]  04/07/2019, 11:33 AM

## 2019-04-07 NOTE — Plan of Care (Signed)
PATIENT CARE PLAN  Problem: Education: Goal: Knowledge of risk factors and measures for prevention of condition will improve Outcome: Progressing   Problem: Coping: Goal: Psychosocial and spiritual needs will be supported Outcome: Progressing   Problem: Respiratory: Goal: Will maintain a patent airway Outcome: Progressing Goal: Complications related to the disease process, condition or treatment will be avoided or minimized Outcome: Progressing

## 2019-04-07 NOTE — Progress Notes (Signed)
Spoke with patients wife, Caziah Stranger, and gave an update on patients status, POC, and discharge plan. Questions answered at this time.

## 2019-04-08 LAB — CBC WITH DIFFERENTIAL/PLATELET
Abs Immature Granulocytes: 0.16 10*3/uL — ABNORMAL HIGH (ref 0.00–0.07)
Basophils Absolute: 0 10*3/uL (ref 0.0–0.1)
Basophils Relative: 0 %
Eosinophils Absolute: 0 10*3/uL (ref 0.0–0.5)
Eosinophils Relative: 0 %
HCT: 41.1 % (ref 39.0–52.0)
Hemoglobin: 13.9 g/dL (ref 13.0–17.0)
Immature Granulocytes: 1 %
Lymphocytes Relative: 4 %
Lymphs Abs: 0.5 10*3/uL — ABNORMAL LOW (ref 0.7–4.0)
MCH: 30.1 pg (ref 26.0–34.0)
MCHC: 33.8 g/dL (ref 30.0–36.0)
MCV: 89 fL (ref 80.0–100.0)
Monocytes Absolute: 0.3 10*3/uL (ref 0.1–1.0)
Monocytes Relative: 3 %
Neutro Abs: 10.5 10*3/uL — ABNORMAL HIGH (ref 1.7–7.7)
Neutrophils Relative %: 92 %
Platelets: 402 10*3/uL — ABNORMAL HIGH (ref 150–400)
RBC: 4.62 MIL/uL (ref 4.22–5.81)
RDW: 13.7 % (ref 11.5–15.5)
WBC: 11.5 10*3/uL — ABNORMAL HIGH (ref 4.0–10.5)
nRBC: 0 % (ref 0.0–0.2)

## 2019-04-08 LAB — COMPREHENSIVE METABOLIC PANEL
ALT: 22 U/L (ref 0–44)
AST: 26 U/L (ref 15–41)
Albumin: 2.8 g/dL — ABNORMAL LOW (ref 3.5–5.0)
Alkaline Phosphatase: 67 U/L (ref 38–126)
Anion gap: 11 (ref 5–15)
BUN: 35 mg/dL — ABNORMAL HIGH (ref 8–23)
CO2: 21 mmol/L — ABNORMAL LOW (ref 22–32)
Calcium: 8.3 mg/dL — ABNORMAL LOW (ref 8.9–10.3)
Chloride: 106 mmol/L (ref 98–111)
Creatinine, Ser: 0.82 mg/dL (ref 0.61–1.24)
GFR calc Af Amer: >60 mL/min (ref >60)
GFR calc non Af Amer: >60 mL/min (ref >60)
Glucose, Bld: 163 mg/dL — ABNORMAL HIGH (ref 70–99)
Potassium: 4.9 mmol/L (ref 3.5–5.1)
Sodium: 138 mmol/L (ref 135–145)
Total Bilirubin: 0.5 mg/dL (ref 0.3–1.2)
Total Protein: 6.1 g/dL — ABNORMAL LOW (ref 6.5–8.1)

## 2019-04-08 LAB — GLUCOSE, CAPILLARY
Glucose-Capillary: 142 mg/dL — ABNORMAL HIGH (ref 70–99)
Glucose-Capillary: 185 mg/dL — ABNORMAL HIGH (ref 70–99)
Glucose-Capillary: 148 mg/dL — ABNORMAL HIGH (ref 70–99)
Glucose-Capillary: 260 mg/dL — ABNORMAL HIGH (ref 70–99)
Glucose-Capillary: 233 mg/dL — ABNORMAL HIGH (ref 70–99)

## 2019-04-08 LAB — FERRITIN: Ferritin: 945 ng/mL — ABNORMAL HIGH (ref 24–336)

## 2019-04-08 LAB — D-DIMER, QUANTITATIVE: D-Dimer, Quant: 3.2 ug/mL-FEU — ABNORMAL HIGH (ref 0.00–0.50)

## 2019-04-08 LAB — C-REACTIVE PROTEIN: CRP: 14.3 mg/dL — ABNORMAL HIGH (ref <1.0)

## 2019-04-08 NOTE — Plan of Care (Signed)
PATIENT CARE PLAN  Problem: Education: Goal: Knowledge of risk factors and measures for prevention of condition will improve Outcome: Progressing   Problem: Coping: Goal: Psychosocial and spiritual needs will be supported Outcome: Progressing   Problem: Respiratory: Goal: Will maintain a patent airway Outcome: Progressing Goal: Complications related to the disease process, condition or treatment will be avoided or minimized Outcome: Progressing

## 2019-04-08 NOTE — Progress Notes (Signed)
Spoke with patients wife, Nickoli Trupp, and gave update on patients status.

## 2019-04-08 NOTE — Progress Notes (Signed)
915pm This nurse placed call to wife and advised update. 343am The patient is currently in bed resting quietly.

## 2019-04-08 NOTE — Progress Notes (Signed)
PROGRESS NOTE  Dustin Diaz [id number] DOB: 30-Oct-1945 DOA: 04/05/2019  Referring MD/NP/PA: Dr. Ronnald Nian  PCP: Lavone Orn, MD   Outpatient Specialists: None  Patient coming from: Home  Chief Complaint: Shortness of breath  HPI: Dustin Diaz is a 74 y.o. male with medical history significant of diabetes, hypertension, hyperlipidemia, polymyalgia rheumatica who presents to the ER with shortness of breath and cough.  Symptoms have been going on for about a week after a family gathering of more than 10 people on March 20, 2019.  Patient diagnosed with COVID-19 about 8 days ago and came in with significant shortness of breath and hypoxia.  Oxygen sats of 80% on arrival. He has congestion cough and fever.  He also has some myalgias and arthralgias.  Patient was seen and evaluated. Chest x-ray and CT angiogram of the chest confirmed bilateral infiltrates consistent with Covid pneumonia. Patient is currently on 8 L of oxygen and is notably tachypneic. He is being admitted to the hospital with moderate to severe COVID-19 pneumonia. In ED: Temperature 98.7 blood pressure 147/86 pulse 74 respirate of 38 oxygen sat 79% on room air. White count 13.9 otherwise CBC and chemistry appear to be relatively normal. LDH is 251 triglyceride 322 ferritin 663 CRP of 30 and procalcitonin 0.64. D-dimer 1.74 fibrinogen more than 800. Repeat COVID-19 testing today is positive. Chest x-ray again showed bilateral infiltrates. Patient will be admitted to [facility] due to high oxygen demand.  Subjective: Continues to feel markedly dyspneic with minimal exertion, no acute issues or events overnight.  Admits to right sided pleuritic chest pain on the posterio-lateral aspect but well controlled on current regimen.  Denies nausea, vomiting, diarrhea, constipation, headache, fevers, chills.   Assessment/Plan Principal Problem:   Pneumonia due to COVID-19 virus Active Problems:   Hypertension  Diabetes ([place])   High cholesterol   Polymyalgia rheumatica (HCC)   Acute hypoxic respiratory failure in the setting of COVID-19 pneumonia, POA  Patient meets criteria for severe disease SpO2: 91 % O2 Flow Rate (L/min): 10 L/min FiO2 (%): 100 % Recent Labs    04/05/19 1609 04/05/19 1609 04/06/19 0534 04/07/19 0050 04/08/19 0030  DDIMER 1.74*   < > 1.85* 1.71* 3.20*  FERRITIN 663*   < > 872* 917* 945*  LDH 251*  --   --   --   --   CRP 29.9*   < > 36.4* 25.4* 14.3*   < > = values in this interval not displayed.  - Actemra x1 on 1/14 - repeat dose early 1/16 given symptoms/oxygen/CRP levels worsening - Chest x-ray personally reviewed, right middle lobe and left lower lobe patchy opacification concerning for diffuse infiltrate. - Continue dexamethasone, remdesivir  - Procalcitonin noted to be low after admission - unlikely bacterial infection, continue to hold antibiotics; received single dose of IV Rocephin and Zithromax at admission  Non-insulin-dependent diabetes mellitus type 2 diabetes:  Currently well controlled Continue sliding scale insulin, hypoglycemic protocol  Hypertension: Continue with home regimen amlodipine, irbesartan  Polymyalgia rheumatica:  Patient chronically on steroids (prednisone) Continue dexamethasone now, transition back to home prednisone dose at discharge  Leukemoid reaction in the setting of steroids as above  Continue to follow daily labs  Hyperlipidemia:  Continue statin  DVT prophylaxis: Lovenox Code Status: Full code Family Communication: Wife updated over the phone Disposition Plan: Home pending improvement in hypoxia and Covid symptoms as above Consults called: None Admission status: Inpatient, currently requires oxygen, IV medications and IV fluids.  Suspect  patient will need 4 to 5 days of inpatient treatment to complete Remdesivir course, possibly longer if he remains markedly hypoxic.   Physical Exam: Vitals:   04/07/19 1600  04/07/19 1945 04/08/19 0435 04/08/19 0721  BP: 127/60 101/62 (!) 107/51 (!) 130/55  Pulse: 66 67 63 67  Resp:  20 20 20   Temp: 98.6 F (37 C) 98.2 F (36.8 C) 98.2 F (36.8 C) 98.2 F (36.8 C)  TempSrc: Oral Oral Oral Oral  SpO2: 90% 95% 98% 91%  Weight:      Height:        General:  Pleasantly resting in bed, No acute distress.  Tolerating nasal cannula without respiratory distress HEENT:  Normocephalic atraumatic.  Sclerae nonicteric, noninjected.  Extraocular movements intact bilaterally. Neck:  Without mass or deformity.  Trachea is midline. Lungs: Bilateral diminished breath sounds, scant rhonchi left lower lobe otherwise without overt rales or wheeze Heart:  Regular rate and rhythm.  Without murmurs, rubs, or gallops. Abdomen:  Soft, nontender, nondistended.  Without guarding or rebound. Extremities: Without cyanosis, clubbing, edema, or obvious deformity. Vascular:  Dorsalis pedis and posterior tibial pulses palpable bilaterally. Skin:  Warm and dry, no erythema, no ulcerations.  Labs on Admission: I have personally reviewed following labs and imaging studies  CBC: Recent Labs  Lab 04/05/19 1609 04/06/19 0534 04/07/19 0050 04/08/19 0030  WBC 13.9* 17.4* 14.2* 11.5*  NEUTROABS 12.7* 16.2* 12.9* 10.5*  HGB 13.9 14.1 13.0 13.9  HCT 44.0 42.5 37.6* 41.1  MCV 95.4 92.4 88.9 89.0  PLT 256 327 362 [id number]   Basic Metabolic Panel: Recent Labs  Lab 04/05/19 1609 04/06/19 0534 04/07/19 0050 04/08/19 0030  NA 135 138 137 138  K 4.5 4.4 4.8 4.9  CL 100 104 105 106  CO2 22 20* 23 21*  GLUCOSE 206* 144* 209* 163*  BUN 20 25* 39* 35*  CREATININE 1.16 1.02 0.93 0.82  CALCIUM 8.4* 8.1* 8.2* 8.3*   GFR: Estimated Creatinine Clearance: 84.1 mL/min (by C-G formula based on SCr of 0.82 mg/dL).   Liver Function Tests: Recent Labs  Lab 04/05/19 1609 04/06/19 0534 04/07/19 0050 04/08/19 0030  AST 39 30 25 26   ALT 32 31 22 22   ALKPHOS 72 73 83 67  BILITOT 0.9 0.7 0.7  0.5  PROT 6.9 6.4* 6.1* 6.1*  ALBUMIN 3.3* 2.8* 2.8* 2.8*   HbA1C: Recent Labs    04/06/19 0540  HGBA1C 7.1*   CBG: Recent Labs  Lab 04/07/19 0718 04/07/19 1216 04/07/19 1644 04/07/19 2035 04/08/19 0746  GLUCAP 160* 178* 267* 160* 142*   Lipid Profile: Recent Labs    04/05/19 1609  TRIG 322*   Anemia Panel: Recent Labs    04/07/19 0050 04/08/19 0030  FERRITIN 917* 945*    Recent Results (from the past 240 hour(s))  Blood Culture (routine x 2)     Status: None (Preliminary result)   Collection Time: 04/05/19  4:08 PM   Specimen: BLOOD LEFT HAND  Result Value Ref Range Status   Specimen Description   Final    BLOOD LEFT HAND Performed at [facility], [place] [address] [place], [place] [address]    Special Requests   Final    BOTTLES DRAWN AEROBIC AND ANAEROBIC Blood Culture adequate volume Performed at [facility] [address] [place], [place] [address]    Culture   Final    NO GROWTH 3 DAYS Performed at [facility], [place] [address]  [place], [place] [address]    Report Status PENDING  Incomplete  Blood Culture (routine x 2)     Status: None (Preliminary result)   Collection Time: 04/05/19  4:19 PM   Specimen: BLOOD LEFT HAND  Result Value Ref Range Status   Specimen Description   Final    BLOOD LEFT HAND Performed at [facility] [address], [place], [place] [address]    Special Requests   Final    BOTTLES DRAWN AEROBIC ONLY Blood Culture adequate volume Performed at [facility] [address] [place], [place] [address]    Culture   Final    NO GROWTH 3 DAYS Performed at [facility], [place] [address], [employer], [place] [address]    Report Status PENDING  Incomplete     Radiological Exams on Admission: No results found. EKG: Independently reviewed.  It shows sinus tachycardia no significant ST change  [facility]  Pager [phone]  If 7PM-7AM, please contact night-coverage [email or web address] Password [facility]  04/08/2019, 11:43 AM

## 2019-04-09 ENCOUNTER — Inpatient Hospital Stay (HOSPITAL_COMMUNITY): Payer: Medicare HMO

## 2019-04-09 DIAGNOSIS — J9601 Acute respiratory failure with hypoxia: Secondary | ICD-10-CM

## 2019-04-09 DIAGNOSIS — R7989 Other specified abnormal findings of blood chemistry: Secondary | ICD-10-CM

## 2019-04-09 LAB — COMPREHENSIVE METABOLIC PANEL
ALT: 26 U/L (ref 0–44)
AST: 33 U/L (ref 15–41)
Albumin: 2.9 g/dL — ABNORMAL LOW (ref 3.5–5.0)
Alkaline Phosphatase: 70 U/L (ref 38–126)
Anion gap: 10 (ref 5–15)
BUN: 31 mg/dL — ABNORMAL HIGH (ref 8–23)
CO2: 22 mmol/L (ref 22–32)
Calcium: 8.3 mg/dL — ABNORMAL LOW (ref 8.9–10.3)
Chloride: 104 mmol/L (ref 98–111)
Creatinine, Ser: 0.75 mg/dL (ref 0.61–1.24)
GFR calc Af Amer: >60 mL/min (ref >60)
GFR calc non Af Amer: >60 mL/min (ref >60)
Glucose, Bld: 132 mg/dL — ABNORMAL HIGH (ref 70–99)
Potassium: 4.7 mmol/L (ref 3.5–5.1)
Sodium: 136 mmol/L (ref 135–145)
Total Bilirubin: 1 mg/dL (ref 0.3–1.2)
Total Protein: 6 g/dL — ABNORMAL LOW (ref 6.5–8.1)

## 2019-04-09 LAB — C-REACTIVE PROTEIN: CRP: 7.6 mg/dL — ABNORMAL HIGH (ref <1.0)

## 2019-04-09 LAB — CBC WITH DIFFERENTIAL/PLATELET
Abs Immature Granulocytes: 0.34 10*3/uL — ABNORMAL HIGH (ref 0.00–0.07)
Basophils Absolute: 0.1 10*3/uL (ref 0.0–0.1)
Basophils Relative: 1 %
Eosinophils Absolute: 0 10*3/uL (ref 0.0–0.5)
Eosinophils Relative: 0 %
HCT: 41.7 % (ref 39.0–52.0)
Hemoglobin: 13.7 g/dL (ref 13.0–17.0)
Immature Granulocytes: 4 %
Lymphocytes Relative: 5 %
Lymphs Abs: 0.5 10*3/uL — ABNORMAL LOW (ref 0.7–4.0)
MCH: 29.5 pg (ref 26.0–34.0)
MCHC: 32.9 g/dL (ref 30.0–36.0)
MCV: 89.9 fL (ref 80.0–100.0)
Monocytes Absolute: 0.4 10*3/uL (ref 0.1–1.0)
Monocytes Relative: 4 %
Neutro Abs: 8.1 10*3/uL — ABNORMAL HIGH (ref 1.7–7.7)
Neutrophils Relative %: 86 %
Platelets: 397 10*3/uL (ref 150–400)
RBC: 4.64 MIL/uL (ref 4.22–5.81)
RDW: 14 % (ref 11.5–15.5)
WBC: 9.3 10*3/uL (ref 4.0–10.5)
nRBC: 0 % (ref 0.0–0.2)

## 2019-04-09 LAB — GLUCOSE, CAPILLARY
Glucose-Capillary: 132 mg/dL — ABNORMAL HIGH (ref 70–99)
Glucose-Capillary: 241 mg/dL — ABNORMAL HIGH (ref 70–99)
Glucose-Capillary: 216 mg/dL — ABNORMAL HIGH (ref 70–99)
Glucose-Capillary: 206 mg/dL — ABNORMAL HIGH (ref 70–99)

## 2019-04-09 LAB — D-DIMER, QUANTITATIVE: D-Dimer, Quant: 12.57 ug/mL-FEU — ABNORMAL HIGH (ref 0.00–0.50)

## 2019-04-09 LAB — FERRITIN: Ferritin: 569 ng/mL — ABNORMAL HIGH (ref 24–336)

## 2019-04-09 MED ORDER — METHYLPREDNISOLONE SODIUM SUCC 125 MG IJ SOLR: 125.0000 mg | Freq: Once | INTRAMUSCULAR | Status: DC | PRN

## 2019-04-09 MED ORDER — APIXABAN 5 MG PO TABS
10.0000 mg | ORAL_TABLET | Freq: Two times a day (BID) | ORAL | Status: AC
  Administered 2019-04-09 – 2019-04-16 (×14): 10 mg via ORAL
  Filled 2019-04-09 (×14): qty 2

## 2019-04-09 MED ORDER — DIPHENHYDRAMINE HCL 50 MG/ML IJ SOLN: 50.0000 mg | Freq: Once | INTRAMUSCULAR | Status: DC | PRN

## 2019-04-09 MED ORDER — SODIUM CHLORIDE 0.9 % IV SOLN: INTRAVENOUS | Status: DC | PRN

## 2019-04-09 MED ORDER — APIXABAN 5 MG PO TABS
5.0000 mg | ORAL_TABLET | Freq: Two times a day (BID) | ORAL | Status: DC
  Administered 2019-04-16 – 2019-05-03 (×35): 5 mg via ORAL
  Filled 2019-04-09 (×34): qty 1

## 2019-04-09 MED ORDER — FAMOTIDINE IN NACL 20-0.9 MG/50ML-% IV SOLN: 20.0000 mg | Freq: Once | INTRAVENOUS | Status: DC | PRN

## 2019-04-09 MED ORDER — ALBUTEROL SULFATE HFA 108 (90 BASE) MCG/ACT IN AERS: 2.0000 | INHALATION_SPRAY | Freq: Once | RESPIRATORY_TRACT | Status: DC | PRN

## 2019-04-09 MED ORDER — IOHEXOL 350 MG/ML SOLN
75.0000 mL | Freq: Once | INTRAVENOUS | Status: AC | PRN
  Administered 2019-04-09: 75 mL via INTRAVENOUS

## 2019-04-09 MED ORDER — EPINEPHRINE 0.3 MG/0.3ML IJ SOAJ: 0.3000 mg | Freq: Once | INTRAMUSCULAR | Status: DC | PRN

## 2019-04-09 MED ORDER — SODIUM CHLORIDE 0.9 % IV SOLN: 700.0000 mg | Freq: Once | INTRAVENOUS | Status: DC

## 2019-04-09 NOTE — Plan of Care (Signed)
The patient has expressed that he is ready to go home, missing family. This nurse called the wife and advised to speak with the husband as much as she can to raise his spirits.

## 2019-04-09 NOTE — Progress Notes (Signed)
ANTICOAGULATION CONSULT NOTE - Initial Consult  Pharmacy Consult for Apixaban Indication: LLE DVT  Allergies  Allergen Reactions  . Codeine     Patient Measurements: Height: 5\' 10"  (177.8 cm) Weight: 171 lb 15.3 oz (78 kg) IBW/kg (Calculated) : 73   Vital Signs: Temp: 97.7 F (36.5 C) (01/18 1141) Temp Source: Oral (01/18 1141) BP: 128/54 (01/18 1141) Pulse Rate: 63 (01/18 1141)  Labs: Recent Labs    04/07/19 0050 04/07/19 0050 04/08/19 0030 04/09/19 0100  HGB 13.0   < > 13.9 13.7  HCT 37.6*  --  41.1 41.7  PLT 362  --  402* 397  CREATININE 0.93  --  0.82 0.75   < > = values in this interval not displayed.    Estimated Creatinine Clearance: 86.2 mL/min (by C-G formula based on SCr of 0.75 mg/dL).   Medical History: Past Medical History:  Diagnosis Date  . Diabetes mellitus without complication ([place])   . High cholesterol   . Hyperlipemia   . Hypertension   . Polymyalgia rheumatica ([place])     Assessment: 65 YOM admitted 1/14 with COVID and found to have a new acute LLE DVT on 1/18. Pharmacy consulted for Apixaban dosing.   The patient received Lovenox 40 earlier today around 0500. LFTs wnl, CBC stable  Goal of Therapy:  Appropriate anticoagulation for indication and hepatic/renal function    Plan:  - D/c Lovenox - Start Apixaban 10 mg bid x 7 days followed by 5 mg bid - Will plan to provide education prior to discharge - Pharmacy will sign off of consult and monitor peripherally  Thank you for allowing pharmacy to be a part of this patient's care.  Alycia Rossetti, PharmD, BCPS Clinical Pharmacist 04/09/2019 3:08 PM   **Pharmacist phone directory can now be found on [email or web address] ([email or web address]).  Listed under [employer].

## 2019-04-09 NOTE — Progress Notes (Addendum)
PROGRESS NOTE  Dustin Diaz [id number] DOB: [birth year] DOA: 04/05/2019  Referring MD/NP/PA: Dr. Ronnald Nian  PCP: Lavone Orn, MD   Outpatient Specialists: None  Patient coming from: Home  Chief Complaint: Shortness of breath  HPI: Dustin Diaz is a 74 y.o. male with medical history significant of diabetes, hypertension, hyperlipidemia, polymyalgia rheumatica who presents to the ER with shortness of breath and cough.  Symptoms have been going on for about a week after a family gathering of more than 10 people on March 20, 2019.  Patient diagnosed with COVID-19 about 8 days ago and came in with significant shortness of breath and hypoxia.  Oxygen sats of 80% on arrival. He has congestion cough and fever.  He also has some myalgias and arthralgias.  Patient was seen and evaluated. Chest x-ray and CT angiogram of the chest confirmed bilateral infiltrates consistent with Covid pneumonia. Patient is currently on 8 L of oxygen and is notably tachypneic. He is being admitted to the hospital with moderate to severe COVID-19 pneumonia. In ED: Temperature 98.7 blood pressure 147/86 pulse 74 respirate of 38 oxygen sat 79% on room air. White count 13.9 otherwise CBC and chemistry appear to be relatively normal. LDH is 251 triglyceride 322 ferritin 663 CRP of 30 and procalcitonin 0.64. D-dimer 1.74 fibrinogen more than 800. Repeat COVID-19 testing today is positive. Chest x-ray again showed bilateral infiltrates. Patient will be admitted to [facility] due to high oxygen demand.  Subjective: No acute issues or events overnight, patient indicates that ambulating and transitioning for CT scan this morning was somewhat taxing consistent with his previous planes of dyspnea with exertion but seems to be improving per the patient.  She otherwise denies chest pain, nausea, vomiting, diarrhea, constipation, headache, fevers, chills.  Assessment/Plan Principal Problem:   Pneumonia due to  COVID-19 virus Active Problems:   Hypertension   Diabetes ([place])   High cholesterol   Polymyalgia rheumatica (HCC)   Acute hypoxic respiratory failure in the setting of COVID-19 pneumonia, POA  Patient meets criteria for severe disease -confirmed on CT chest given marked bilateral infiltrates SpO2: 96 % O2 Flow Rate (L/min): 10 L/min FiO2 (%): 100 % Recent Labs    04/07/19 0050 04/08/19 0030 04/09/19 0100  DDIMER 1.71* 3.20* 12.57*  FERRITIN 917* 945* 569*  CRP 25.4* 14.3* 7.6*  - Actemra x1 on 1/14 - repeat dose early 1/16 given symptoms/oxygen/CRP levels worsening - Chest x-ray personally reviewed, right middle lobe and left lower lobe patchy opacification concerning for diffuse infiltrate. - Continue dexamethasone through 1/23 - Remdesivir completed 1/18  - Procalcitonin noted to be low after admission - unlikely bacterial infection, continue to hold antibiotics; received single dose of IV Rocephin and Zithromax at admission  D-dimer elevation in the setting of above - CTA negative for overt filling defect - severe disease noted consistent with oxygen levels above - BLE [place] - remarkable for L sided DVT - start eliquis  Non-insulin-dependent diabetes mellitus type 2 diabetes:  Currently well controlled Continue sliding scale insulin, hypoglycemic protocol  Hypertension: Continue with home regimen amlodipine, irbesartan  Polymyalgia rheumatica:  Patient chronically on steroids (prednisone) Continue dexamethasone now, transition back to home prednisone dose at discharge  Leukemoid reaction in the setting of steroids as above  Continue to follow daily labs  Incidentally noted lung nodule - Follow up imaging in 3 to 6 months per PCP/pulmonology  Hyperlipidemia:  Continue statin  DVT prophylaxis: Eliquis (added 1/18 for DVT) Code Status: Full code Family  Communication: Wife updated over the phone Disposition Plan: Home pending improvement in hypoxia and Covid symptoms  as above Consults called: None Admission status: Inpatient, currently requires oxygen, IV antiviral treatment. Suspect patient will need an additional 72-96 hours of inpatient treatment to complete Remdesivir course, possibly longer if he remains markedly hypoxic given severe disease as above.   Physical Exam: Vitals:   04/08/19 2100 04/09/19 0000 04/09/19 0400 04/09/19 0714  BP: 126/64 (!) 101/52 130/69 (!) 119/56  Pulse:    60  Resp:    (!) 22  Temp: 98.1 F (36.7 C) 97.9 F (36.6 C) 97.9 F (36.6 C) 97.7 F (36.5 C)  TempSrc: Oral Axillary Oral Oral  SpO2:    96%  Weight:      Height:        General:  Pleasantly resting in bed, No acute distress. Tolerating nasal cannula without respiratory distress HEENT:  Normocephalic atraumatic.  Sclerae nonicteric, noninjected.  Extraocular movements intact bilaterally. Neck:  Without mass or deformity.  Trachea is midline. Lungs: Bilateral diminished breath sounds, scant rhonchi left lower lobe otherwise without overt rales or wheeze Heart:  Regular rate and rhythm.  Without murmurs, rubs, or gallops. Abdomen:  Soft, nontender, nondistended.  Without guarding or rebound. Extremities: Without cyanosis, clubbing, edema, or obvious deformity. Vascular:  Dorsalis pedis and posterior tibial pulses palpable bilaterally. Skin:  Warm and dry, no erythema, no ulcerations.  Labs on Admission: I have personally reviewed following labs and imaging studies  CBC: Recent Labs  Lab 04/05/19 1609 04/06/19 0534 04/07/19 0050 04/08/19 0030 04/09/19 0100  WBC 13.9* 17.4* 14.2* 11.5* 9.3  NEUTROABS 12.7* 16.2* 12.9* 10.5* 8.1*  HGB 13.9 14.1 13.0 13.9 13.7  HCT 44.0 42.5 37.6* 41.1 41.7  MCV 95.4 92.4 88.9 89.0 89.9  PLT 256 327 362 402* [id number]   Basic Metabolic Panel: Recent Labs  Lab 04/05/19 1609 04/06/19 0534 04/07/19 0050 04/08/19 0030 04/09/19 0100  NA 135 138 137 138 136  K 4.5 4.4 4.8 4.9 4.7  CL 100 104 105 106 104  CO2 22 20* 23  21* 22  GLUCOSE 206* 144* 209* 163* 132*  BUN 20 25* 39* 35* 31*  CREATININE 1.16 1.02 0.93 0.82 0.75  CALCIUM 8.4* 8.1* 8.2* 8.3* 8.3*   GFR: Estimated Creatinine Clearance: 86.2 mL/min (by C-G formula based on SCr of 0.75 mg/dL).   Liver Function Tests: Recent Labs  Lab 04/05/19 1609 04/06/19 0534 04/07/19 0050 04/08/19 0030 04/09/19 0100  AST 39 30 25 26  33  ALT 32 31 22 22 26   ALKPHOS 72 73 83 67 70  BILITOT 0.9 0.7 0.7 0.5 1.0  PROT 6.9 6.4* 6.1* 6.1* 6.0*  ALBUMIN 3.3* 2.8* 2.8* 2.8* 2.9*   HbA1C: No results for input(s): HGBA1C in the last 72 hours. CBG: Recent Labs  Lab 04/08/19 1216 04/08/19 1737 04/08/19 1924 04/08/19 2000 04/09/19 0713  GLUCAP 185* 148* 260* 233* 132*   Lipid Profile: No results for input(s): CHOL, HDL, LDLCALC, TRIG, CHOLHDL, LDLDIRECT in the last 72 hours. Anemia Panel: Recent Labs    04/08/19 0030 04/09/19 0100  FERRITIN 945* 569*    Recent Results (from the past 240 hour(s))  Blood Culture (routine x 2)     Status: None (Preliminary result)   Collection Time: 04/05/19  4:08 PM   Specimen: BLOOD LEFT HAND  Result Value Ref Range Status   Specimen Description   Final    BLOOD LEFT HAND Performed at [facility], [address] [place], [place] [address]    Special Requests   Final    BOTTLES DRAWN AEROBIC AND ANAEROBIC Blood Culture adequate volume Performed at [facility] [address] [place], [place] [address]    Culture   Final    NO GROWTH 4 DAYS Performed at [facility], [place] [address] [place], [place] [address]    Report Status PENDING  Incomplete  Blood Culture (routine x 2)     Status: None (Preliminary result)   Collection Time: 04/05/19  4:19 PM   Specimen: BLOOD LEFT HAND  Result Value Ref Range Status   Specimen Description   Final    BLOOD LEFT HAND Performed at [facility] [address], [place], [place] [address]    Special  Requests   Final    BOTTLES DRAWN AEROBIC ONLY Blood Culture adequate volume Performed at [facility] [address] [place], [place] [address]    Culture   Final    NO GROWTH 4 DAYS Performed at [facility], [place] [address], [place], [place] [address]    Report Status PENDING  Incomplete     Radiological Exams on Admission: CT ANGIO CHEST PE W OR WO CONTRAST  Result Date: 04/09/2019 CLINICAL DATA:  COVID pneumonia, hypoxia, shortness of breath and elevated D-dimer. EXAM: CT ANGIOGRAPHY CHEST WITH CONTRAST TECHNIQUE: Multidetector CT imaging of the chest was performed using the standard protocol during bolus administration of intravenous contrast. Multiplanar CT image reconstructions and MIPs were obtained to evaluate the vascular anatomy. CONTRAST:  67mL OMNIPAQUE IOHEXOL 350 MG/ML SOLN COMPARISON:  Chest x-ray on 04/05/2019 FINDINGS: Cardiovascular: The pulmonary arteries are well opacified. There is no evidence pulmonary embolism. Central pulmonary arteries are normal in caliber. The heart size is normal. The thoracic aorta is normal in caliber. Trace amount of pericardial fluid. Calcified plaque is noted of the coronary arteries primarily in the distribution of the LAD. Mediastinum/Nodes: Mildly prominent right hilar nodal tissue measuring up to 1.5 cm. No evidence of enlarged mediastinal or axillary lymph nodes. Lungs/Pleura: Severe airspace disease is seen throughout all lobes of both lungs in a ground-glass pattern. There is an associated very small right pleural effusion. No pneumothorax. Upper Abdomen: No acute abnormality. Musculoskeletal: No chest wall abnormality. No acute or significant osseous findings. Review of the MIP images confirms the above findings. IMPRESSION: 1. No evidence of pulmonary embolism. 2. Severe airspace disease throughout all lobes of both lungs in a ground-glass pattern. Findings are consistent with severe bilateral COVID-19 pneumonia. 3.  Mildly prominent right hilar nodal tissue measuring up to 1.5 cm in short axis. This is likely reactive. 4. Coronary artery disease with calcified plaque in the distribution of the LAD. Electronically Signed   By: Aletta Edouard M.D.   On: 04/09/2019 08:58   EKG: Independently reviewed.  It shows sinus tachycardia no significant ST change  [facility] Pager [phone]  If 7PM-7AM, please contact night-coverage [email or web address] Password [facility]  04/09/2019, 10:45 AM

## 2019-04-09 NOTE — Progress Notes (Addendum)
Inpatient Diabetes Program Recommendations  AACE/ADA: New Consensus Statement on Inpatient Glycemic Control (2015)  Target Ranges:  Prepandial:   less than 140 mg/dL      Peak postprandial:   less than 180 mg/dL (1-2 hours)      Critically ill patients:  140 - 180 mg/dL   Lab Results  Component Value Date   GLUCAP 132 (H) 04/09/2019   HGBA1C 7.1 (H) 04/06/2019    Review of Glycemic Control Results for BOJAN, BOMKAMP (MRN [id number]) as of 04/09/2019 08:56  Ref. Range 04/08/2019 07:46 04/08/2019 12:16 04/08/2019 17:37 04/08/2019 19:24 04/08/2019 20:00 04/09/2019 07:13  Glucose-Capillary Latest Ref Range: 70 - 99 mg/dL 142 (H) 185 (H) 148 (H) 260 (H) 233 (H) 132 (H)   Diabetes history: DM 2 Outpatient Diabetes medications: Amaryl 2 mg Daily, Jardiance 25 mg Daily, Metformin 2000 mg Daily, Actos 45 mg Daily Current orders for Inpatient glycemic control:  Novolog 0-20 units tid + hs Amaryl 2 mg Daily Invokana 100 mg Daily Actos 45 mg Daily  Decadron 6 mg Q24 hours BUN/Creat: 31/0.75  Inpatient Diabetes Program Recommendations:    Glucose trends increase after meal intake. Consider Novolog 3 units tid meal coverage if pt is consuming at least 50% of meals.  Thanks,  Tama Headings RN, MSN, BC-ADM Inpatient Diabetes Coordinator Team Pager [phone] (8a-5p)

## 2019-04-09 NOTE — Progress Notes (Signed)
Venous duplex lower ext  has been completed. Refer to [facility] under chart review to view preliminary results.   04/09/2019  2:52 PM Kentrail Shew, Bonnye Fava

## 2019-04-09 NOTE — Progress Notes (Signed)
This nurse has called wife Velva Harman and gicen update on condition. Advised that the patient continues to be on 10lHF o2 on [place]. Alert and verbal. Able to communicate needs and wants. The patient denies pain. Currently in bed resting quietly.

## 2019-04-09 NOTE — Plan of Care (Signed)
  Problem: Education: Goal: Knowledge of risk factors and measures for prevention of condition will improve Outcome: Progressing   Problem: Coping: Goal: Psychosocial and spiritual needs will be supported Outcome: Progressing   Problem: Respiratory: Goal: Will maintain a patent airway Outcome: Progressing Goal: Complications related to the disease process, condition or treatment will be avoided or minimized Outcome: Progressing   

## 2019-04-10 LAB — CBC WITH DIFFERENTIAL/PLATELET
Abs Immature Granulocytes: 0.63 10*3/uL — ABNORMAL HIGH (ref 0.00–0.07)
Basophils Absolute: 0.1 10*3/uL (ref 0.0–0.1)
Basophils Relative: 1 %
Eosinophils Absolute: 0 10*3/uL (ref 0.0–0.5)
Eosinophils Relative: 0 %
HCT: 42.7 % (ref 39.0–52.0)
Hemoglobin: 14.4 g/dL (ref 13.0–17.0)
Immature Granulocytes: 5 %
Lymphocytes Relative: 6 %
Lymphs Abs: 0.7 10*3/uL (ref 0.7–4.0)
MCH: 30.4 pg (ref 26.0–34.0)
MCHC: 33.7 g/dL (ref 30.0–36.0)
MCV: 90.1 fL (ref 80.0–100.0)
Monocytes Absolute: 0.5 10*3/uL (ref 0.1–1.0)
Monocytes Relative: 4 %
Neutro Abs: 10.3 10*3/uL — ABNORMAL HIGH (ref 1.7–7.7)
Neutrophils Relative %: 84 %
Platelets: 344 10*3/uL (ref 150–400)
RBC: 4.74 MIL/uL (ref 4.22–5.81)
RDW: 13.9 % (ref 11.5–15.5)
WBC: 12.2 10*3/uL — ABNORMAL HIGH (ref 4.0–10.5)
nRBC: 0 % (ref 0.0–0.2)

## 2019-04-10 LAB — GLUCOSE, CAPILLARY
Glucose-Capillary: 127 mg/dL — ABNORMAL HIGH (ref 70–99)
Glucose-Capillary: 252 mg/dL — ABNORMAL HIGH (ref 70–99)
Glucose-Capillary: 121 mg/dL — ABNORMAL HIGH (ref 70–99)
Glucose-Capillary: 351 mg/dL — ABNORMAL HIGH (ref 70–99)

## 2019-04-10 LAB — CULTURE, BLOOD (ROUTINE X 2)
Culture: NO GROWTH
Culture: NO GROWTH
Special Requests: ADEQUATE
Special Requests: ADEQUATE

## 2019-04-10 LAB — COMPREHENSIVE METABOLIC PANEL
ALT: 23 U/L (ref 0–44)
AST: 24 U/L (ref 15–41)
Albumin: 3 g/dL — ABNORMAL LOW (ref 3.5–5.0)
Alkaline Phosphatase: 85 U/L (ref 38–126)
Anion gap: 10 (ref 5–15)
BUN: 30 mg/dL — ABNORMAL HIGH (ref 8–23)
CO2: 24 mmol/L (ref 22–32)
Calcium: 8.5 mg/dL — ABNORMAL LOW (ref 8.9–10.3)
Chloride: 102 mmol/L (ref 98–111)
Creatinine, Ser: 0.76 mg/dL (ref 0.61–1.24)
GFR calc Af Amer: >60 mL/min (ref >60)
GFR calc non Af Amer: >60 mL/min (ref >60)
Glucose, Bld: 149 mg/dL — ABNORMAL HIGH (ref 70–99)
Potassium: 4.6 mmol/L (ref 3.5–5.1)
Sodium: 136 mmol/L (ref 135–145)
Total Bilirubin: 0.9 mg/dL (ref 0.3–1.2)
Total Protein: 5.9 g/dL — ABNORMAL LOW (ref 6.5–8.1)

## 2019-04-10 LAB — D-DIMER, QUANTITATIVE: D-Dimer, Quant: >20 ug/mL-FEU — ABNORMAL HIGH (ref 0.00–0.50)

## 2019-04-10 LAB — C-REACTIVE PROTEIN: CRP: 4.2 mg/dL — ABNORMAL HIGH (ref <1.0)

## 2019-04-10 LAB — FERRITIN: Ferritin: 494 ng/mL — ABNORMAL HIGH (ref 24–336)

## 2019-04-10 NOTE — Progress Notes (Signed)
Inpatient Diabetes Program Recommendations  AACE/ADA: New Consensus Statement on Inpatient Glycemic Control (2015)  Target Ranges:  Prepandial:   less than 140 mg/dL      Peak postprandial:   less than 180 mg/dL (1-2 hours)      Critically ill patients:  140 - 180 mg/dL   Lab Results  Component Value Date   GLUCAP 252 (H) 04/10/2019   HGBA1C 7.1 (H) 04/06/2019    Review of Glycemic Control Results for KANOA, LAMKE (MRN [id number]) as of 04/10/2019 14:18  Ref. Range 04/09/2019 07:13 04/09/2019 11:39 04/09/2019 16:23 04/09/2019 20:03 04/10/2019 07:11 04/10/2019 11:27  Glucose-Capillary Latest Ref Range: 70 - 99 mg/dL 132 (H) 241 (H) 216 (H) 206 (H) 127 (H) 252 (H)    Diabetes history: DM 2 Outpatient Diabetes medications: Amaryl 2 mg Daily, Jardiance 25 mg Daily, Metformin 2000 mg Daily, Actos 45 mg Daily Current orders for Inpatient glycemic control:  Novolog 0-20 units tid + hs Amaryl 2 mg Daily Invokana 100 mg Daily Actos 45 mg Daily  Decadron 6 mg Q24 hours BUN/Creat: 31/0.75  Inpatient Diabetes Program Recommendations:    Glucose trends increase after meal intake. Consider Novolog 3 units tid meal coverage if pt is consuming at least 50% of meals.  Thanks,  Tama Headings RN, MSN, BC-ADM Inpatient Diabetes Coordinator Team Pager [phone] (8a-5p)

## 2019-04-10 NOTE — Progress Notes (Signed)
PROGRESS NOTE  Dustin Diaz [id number] DOB: Jun 23, 1945 DOA: 04/05/2019  Referring MD/NP/PA: Dr. Ronnald Nian  PCP: Lavone Orn, MD   Outpatient Specialists: None  Patient coming from: Home  Chief Complaint: Shortness of breath  HPI: Dustin Diaz is a 74 y.o. male with medical history significant of diabetes, hypertension, hyperlipidemia, polymyalgia rheumatica who presents to the ER with shortness of breath and cough.  Symptoms have been going on for about a week after a family gathering of more than 10 people on March 20, 2019.  Patient diagnosed with COVID-19 about 8 days ago and came in with significant shortness of breath and hypoxia.  Oxygen sats of 80% on arrival. He has congestion cough and fever.  He also has some myalgias and arthralgias.  Patient was seen and evaluated. Chest x-ray and CT angiogram of the chest confirmed bilateral infiltrates consistent with Covid pneumonia. Patient is currently on 8 L of oxygen and is notably tachypneic. He is being admitted to the hospital with moderate to severe COVID-19 pneumonia. In ED: Temperature 98.7 blood pressure 147/86 pulse 74 respirate of 38 oxygen sat 79% on room air. White count 13.9 otherwise CBC and chemistry appear to be relatively normal. LDH is 251 triglyceride 322 ferritin 663 CRP of 30 and procalcitonin 0.64. D-dimer 1.74 fibrinogen more than 800. Repeat COVID-19 testing today is positive. Chest x-ray again showed bilateral infiltrates. Patient will be admitted to [facility] due to high oxygen demand.  Subjective: No acute issues or events overnight, patient indicates that ambulating and transitioning for CT scan this morning was somewhat taxing consistent with his previous planes of dyspnea with exertion but seems to be improving per the patient.  She otherwise denies chest pain, nausea, vomiting, diarrhea, constipation, headache, fevers, chills.  Assessment/Plan Principal Problem:   Pneumonia due to  COVID-19 virus Active Problems:   Hypertension   Diabetes ([place])   High cholesterol   Polymyalgia rheumatica (HCC)   Acute hypoxic respiratory failure in the setting of COVID-19 pneumonia, POA  Patient meets criteria for severe disease -confirmed on CT chest given severe bilateral infiltrates SpO2: 96 % O2 Flow Rate (L/min): 8 L/min FiO2 (%): 100 % Recent Labs    04/08/19 0030 04/09/19 0100 04/10/19 0520  DDIMER 3.20* 12.57* >20.00*  FERRITIN 945* 569* 494*  CRP 14.3* 7.6* 4.2*  -Patient had prolonged illness prior to admission - likely to be a prolonged stay and slow improvement. - Actemra x1 on 1/14 - repeat dose early 1/16 given symptoms/oxygen/CRP levels worsening - but now improving as above - Continue dexamethasone through 1/23 - Remdesivir completed 1/18  - Procalcitonin noted to be low after admission - unlikely bacterial infection, continue to hold antibiotics; received single dose of IV Rocephin and Zithromax at admission  Acute L sided DVT, POA D-dimer elevation in the setting of above - CTA negative for overt filling defect - severe disease noted consistent with oxygen levels above - BLE [place] - remarkable for L sided DVT - continue eliquis  Non-insulin-dependent diabetes mellitus type 2 diabetes:  Currently well controlled Continue sliding scale insulin, hypoglycemic protocol  Hypertension: Continue with home regimen amlodipine, irbesartan  Polymyalgia rheumatica:  Patient chronically on steroids (prednisone) Continue dexamethasone now, transition back to home prednisone dose at discharge  Leukemoid reaction in the setting of steroids as above  Continue to follow daily labs  Incidentally noted lung nodule - Follow up imaging in 3 to 6 months per PCP/pulmonology  Hyperlipidemia:  Continue statin  DVT prophylaxis:  Eliquis (added 1/18 for DVT) Code Status: Full code Family Communication: Wife updated over the phone Disposition Plan: Home pending  improvement in hypoxia and Covid symptoms as above Consults called: None Admission status: Inpatient, currently requires oxygen well above baseline with ongoing severe symptoms with minimal exertion. Likely DC home pending clinical improvement.   Physical Exam: Vitals:   04/10/19 0712 04/10/19 0837 04/10/19 1125 04/10/19 1153  BP: 133/87  (!) 100/55 (!) 112/58  Pulse: 61  62 61  Resp: (!) 24  (!) 26 20  Temp: 98 F (36.7 C)  97.6 F (36.4 C)   TempSrc: Oral  Oral   SpO2: 93% 90% 96%   Weight:      Height:        General:  Pleasantly resting in bed, No acute distress. Tolerating nasal cannula without respiratory distress HEENT:  Normocephalic atraumatic.  Sclerae nonicteric, noninjected.  Extraocular movements intact bilaterally. Neck:  Without mass or deformity.  Trachea is midline. Lungs: Bilateral diminished breath sounds, scant rhonchi left lower lobe otherwise without overt rales or wheeze Heart:  Regular rate and rhythm.  Without murmurs, rubs, or gallops. Abdomen:  Soft, nontender, nondistended.  Without guarding or rebound. Extremities: Without cyanosis, clubbing, edema, or obvious deformity. Vascular:  Dorsalis pedis and posterior tibial pulses palpable bilaterally. Skin:  Warm and dry, no erythema, no ulcerations.  Labs on Admission: I have personally reviewed following labs and imaging studies  CBC: Recent Labs  Lab 04/06/19 0534 04/07/19 0050 04/08/19 0030 04/09/19 0100 04/10/19 0520  WBC 17.4* 14.2* 11.5* 9.3 12.2*  NEUTROABS 16.2* 12.9* 10.5* 8.1* 10.3*  HGB 14.1 13.0 13.9 13.7 14.4  HCT 42.5 37.6* 41.1 41.7 42.7  MCV 92.4 88.9 89.0 89.9 90.1  PLT 327 362 402* 397 [id number]   Basic Metabolic Panel: Recent Labs  Lab 04/06/19 0534 04/07/19 0050 04/08/19 0030 04/09/19 0100 04/10/19 0520  NA 138 137 138 136 136  K 4.4 4.8 4.9 4.7 4.6  CL 104 105 106 104 102  CO2 20* 23 21* 22 24  GLUCOSE 144* 209* 163* 132* 149*  BUN 25* 39* 35* 31* 30*  CREATININE 1.02  0.93 0.82 0.75 0.76  CALCIUM 8.1* 8.2* 8.3* 8.3* 8.5*   GFR: Estimated Creatinine Clearance: 86.2 mL/min (by C-G formula based on SCr of 0.76 mg/dL).   Liver Function Tests: Recent Labs  Lab 04/06/19 0534 04/07/19 0050 04/08/19 0030 04/09/19 0100 04/10/19 0520  AST 30 25 26  33 24  ALT 31 22 22 26 23   ALKPHOS 73 83 67 70 85  BILITOT 0.7 0.7 0.5 1.0 0.9  PROT 6.4* 6.1* 6.1* 6.0* 5.9*  ALBUMIN 2.8* 2.8* 2.8* 2.9* 3.0*   HbA1C: No results for input(s): HGBA1C in the last 72 hours. CBG: Recent Labs  Lab 04/09/19 1139 04/09/19 1623 04/09/19 2003 04/10/19 0711 04/10/19 1127  GLUCAP 241* 216* 206* 127* 252*   Lipid Profile: No results for input(s): CHOL, HDL, LDLCALC, TRIG, CHOLHDL, LDLDIRECT in the last 72 hours. Anemia Panel: Recent Labs    04/09/19 0100 04/10/19 0520  FERRITIN 569* 494*    Recent Results (from the past 240 hour(s))  Blood Culture (routine x 2)     Status: None   Collection Time: 04/05/19  4:08 PM   Specimen: BLOOD LEFT HAND  Result Value Ref Range Status   Specimen Description   Final    BLOOD LEFT HAND Performed at [facility] [address] [place], [place] [address]    Special Requests  Final    BOTTLES DRAWN AEROBIC AND ANAEROBIC Blood Culture adequate volume Performed at [facility] [address], [place], [place] [address]    Culture   Final    NO GROWTH 5 DAYS Performed at [facility], [place] [address] [place], [place] [address]    Report Status 04/10/2019 FINAL  Final  Blood Culture (routine x 2)     Status: None   Collection Time: 04/05/19  4:19 PM   Specimen: BLOOD LEFT HAND  Result Value Ref Range Status   Specimen Description   Final    BLOOD LEFT HAND Performed at [facility] [address] [place], [place] [address]    Special Requests   Final    BOTTLES DRAWN AEROBIC ONLY Blood Culture adequate volume Performed at [facility] [address], [place], [place] [address]    Culture   Final    NO GROWTH 5 DAYS Performed at [facility], [place] [address], [place],  [address]    Report Status 04/10/2019 FINAL  Final     Radiological Exams on Admission: CT ANGIO CHEST PE W OR WO CONTRAST  Result Date: 04/09/2019 CLINICAL DATA:  COVID pneumonia, hypoxia, shortness of breath and elevated D-dimer. EXAM: CT ANGIOGRAPHY CHEST WITH CONTRAST TECHNIQUE: Multidetector CT imaging of the chest was performed using the standard protocol during bolus administration of intravenous contrast. Multiplanar CT image reconstructions and MIPs were obtained to evaluate the vascular anatomy. CONTRAST:  35mL OMNIPAQUE IOHEXOL 350 MG/ML SOLN COMPARISON:  Chest x-ray on 04/05/2019 FINDINGS: Cardiovascular: The pulmonary arteries are well opacified. There is no evidence pulmonary embolism. Central pulmonary arteries are normal in caliber. The heart size is normal. The thoracic aorta is normal in caliber. Trace amount of pericardial fluid. Calcified plaque is noted of the coronary arteries primarily in the distribution of the LAD. Mediastinum/Nodes: Mildly prominent right hilar nodal tissue measuring up to 1.5 cm. No evidence of enlarged mediastinal or axillary lymph nodes. Lungs/Pleura: Severe airspace disease is seen throughout all lobes of both lungs in a ground-glass pattern. There is an associated very small right pleural effusion. No pneumothorax. Upper Abdomen: No acute abnormality. Musculoskeletal: No chest wall abnormality. No acute or significant osseous findings. Review of the MIP images confirms the above findings. IMPRESSION: 1. No evidence of pulmonary embolism. 2. Severe airspace disease throughout all lobes of both lungs in a ground-glass pattern. Findings are consistent with severe bilateral COVID-19 pneumonia. 3. Mildly prominent right hilar nodal tissue measuring up to 1.5 cm in short axis. This is likely reactive. 4. Coronary  artery disease with calcified plaque in the distribution of the LAD. Electronically Signed   By: Aletta Edouard M.D.   On: 04/09/2019 08:58   VAS [place] LOWER EXTREMITY VENOUS (DVT)  Result Date: 04/09/2019  Lower Venous Study Indications: Covid positive with worsening positive d-Dimer and hypoxia.  Anticoagulation: Lovenox. Comparison Study: No priors. Performing Technologist: Oda Cogan RDMS, RVT  Examination Guidelines: A complete evaluation includes B-mode imaging, spectral Doppler, color Doppler, and power Doppler as needed of all accessible portions of each vessel. Bilateral testing is considered an integral part of a complete examination. Limited examinations for reoccurring indications may be performed as noted.  +---------+---------------+---------+-----------+----------+--------------+ RIGHT    CompressibilityPhasicitySpontaneityPropertiesThrombus Aging +---------+---------------+---------+-----------+----------+--------------+ CFV      Full           Yes      Yes                                 +---------+---------------+---------+-----------+----------+--------------+  SFJ      Full                                                        +---------+---------------+---------+-----------+----------+--------------+ FV Prox  Full                                                        +---------+---------------+---------+-----------+----------+--------------+ FV Mid   Full                                                        +---------+---------------+---------+-----------+----------+--------------+ FV DistalFull                                                        +---------+---------------+---------+-----------+----------+--------------+ PFV      Full                                                        +---------+---------------+---------+-----------+----------+--------------+ POP      Full           Yes      Yes                                  +---------+---------------+---------+-----------+----------+--------------+ PTV      Full                                                        +---------+---------------+---------+-----------+----------+--------------+ PERO     Full                                                        +---------+---------------+---------+-----------+----------+--------------+   +---------+---------------+---------+-----------+----------+--------------+ LEFT     CompressibilityPhasicitySpontaneityPropertiesThrombus Aging +---------+---------------+---------+-----------+----------+--------------+ CFV      Full           Yes      Yes                                 +---------+---------------+---------+-----------+----------+--------------+ SFJ      Full                                                        +---------+---------------+---------+-----------+----------+--------------+  FV Prox  Full                                                        +---------+---------------+---------+-----------+----------+--------------+ FV Mid   Full                                                        +---------+---------------+---------+-----------+----------+--------------+ FV DistalFull                                                        +---------+---------------+---------+-----------+----------+--------------+ PFV      Full                                                        +---------+---------------+---------+-----------+----------+--------------+ POP      Full           Yes      Yes                                 +---------+---------------+---------+-----------+----------+--------------+ PTV      Full                                                        +---------+---------------+---------+-----------+----------+--------------+ PERO     Partial                                      Acute           +---------+---------------+---------+-----------+----------+--------------+ Soleal   Full                                         Acute          +---------+---------------+---------+-----------+----------+--------------+     Summary: Right: There is no evidence of deep vein thrombosis in the lower extremity. No cystic structure found in the popliteal fossa. Left: Findings consistent with acute deep vein thrombosis involving the left peroneal veins, and left soleal veins. Short segment of localized thrombus in the peroneal and soleal veins in the mid segment.  *See table(s) above for measurements and observations. Electronically signed by Ruta Hinds MD on 04/09/2019 at 2:56:25 PM.    Final    EKG: Independently reviewed.  It shows sinus tachycardia no significant ST change  [facility] Pager [phone]  If 7PM-7AM, please contact night-coverage [email or web address]  04/10/2019, 2:14 PM

## 2019-04-10 NOTE — Plan of Care (Signed)
  Problem: Education: Goal: Knowledge of risk factors and measures for prevention of condition will improve Outcome: Progressing   Problem: Coping: Goal: Psychosocial and spiritual needs will be supported Outcome: Progressing   Problem: Respiratory: Goal: Will maintain a patent airway Outcome: Progressing Goal: Complications related to the disease process, condition or treatment will be avoided or minimized Outcome: Progressing   

## 2019-04-11 DIAGNOSIS — I10 Essential (primary) hypertension: Secondary | ICD-10-CM

## 2019-04-11 LAB — CBC WITH DIFFERENTIAL/PLATELET
Abs Immature Granulocytes: 0.92 10*3/uL — ABNORMAL HIGH (ref 0.00–0.07)
Basophils Absolute: 0 10*3/uL (ref 0.0–0.1)
Basophils Relative: 0 %
Eosinophils Absolute: 0 10*3/uL (ref 0.0–0.5)
Eosinophils Relative: 0 %
HCT: 46.3 % (ref 39.0–52.0)
Hemoglobin: 15.3 g/dL (ref 13.0–17.0)
Immature Granulocytes: 7 %
Lymphocytes Relative: 4 %
Lymphs Abs: 0.6 10*3/uL — ABNORMAL LOW (ref 0.7–4.0)
MCH: 29.8 pg (ref 26.0–34.0)
MCHC: 33 g/dL (ref 30.0–36.0)
MCV: 90.3 fL (ref 80.0–100.0)
Monocytes Absolute: 0.4 10*3/uL (ref 0.1–1.0)
Monocytes Relative: 3 %
Neutro Abs: 11.9 10*3/uL — ABNORMAL HIGH (ref 1.7–7.7)
Neutrophils Relative %: 86 %
Platelets: 340 10*3/uL (ref 150–400)
RBC: 5.13 MIL/uL (ref 4.22–5.81)
RDW: 13.8 % (ref 11.5–15.5)
WBC: 13.9 10*3/uL — ABNORMAL HIGH (ref 4.0–10.5)
nRBC: 0 % (ref 0.0–0.2)

## 2019-04-11 LAB — D-DIMER, QUANTITATIVE: D-Dimer, Quant: >20 ug/mL-FEU — ABNORMAL HIGH (ref 0.00–0.50)

## 2019-04-11 LAB — GLUCOSE, CAPILLARY
Glucose-Capillary: 161 mg/dL — ABNORMAL HIGH (ref 70–99)
Glucose-Capillary: 212 mg/dL — ABNORMAL HIGH (ref 70–99)
Glucose-Capillary: 146 mg/dL — ABNORMAL HIGH (ref 70–99)
Glucose-Capillary: 241 mg/dL — ABNORMAL HIGH (ref 70–99)

## 2019-04-11 LAB — COMPREHENSIVE METABOLIC PANEL
ALT: 21 U/L (ref 0–44)
AST: 23 U/L (ref 15–41)
Albumin: 3.2 g/dL — ABNORMAL LOW (ref 3.5–5.0)
Alkaline Phosphatase: 88 U/L (ref 38–126)
Anion gap: 10 (ref 5–15)
BUN: 32 mg/dL — ABNORMAL HIGH (ref 8–23)
CO2: 24 mmol/L (ref 22–32)
Calcium: 8.6 mg/dL — ABNORMAL LOW (ref 8.9–10.3)
Chloride: 100 mmol/L (ref 98–111)
Creatinine, Ser: 0.69 mg/dL (ref 0.61–1.24)
GFR calc Af Amer: >60 mL/min (ref >60)
GFR calc non Af Amer: >60 mL/min (ref >60)
Glucose, Bld: 171 mg/dL — ABNORMAL HIGH (ref 70–99)
Potassium: 4.7 mmol/L (ref 3.5–5.1)
Sodium: 134 mmol/L — ABNORMAL LOW (ref 135–145)
Total Bilirubin: 1.3 mg/dL — ABNORMAL HIGH (ref 0.3–1.2)
Total Protein: 6.2 g/dL — ABNORMAL LOW (ref 6.5–8.1)

## 2019-04-11 LAB — C-REACTIVE PROTEIN: CRP: 2.7 mg/dL — ABNORMAL HIGH (ref <1.0)

## 2019-04-11 LAB — FERRITIN: Ferritin: 485 ng/mL — ABNORMAL HIGH (ref 24–336)

## 2019-04-11 MED ORDER — INSULIN GLARGINE 100 UNIT/ML ~~LOC~~ SOLN
12.0000 [IU] | Freq: Every day | SUBCUTANEOUS | Status: DC
  Administered 2019-04-11 – 2019-04-16 (×6): 12 [IU] via SUBCUTANEOUS
  Filled 2019-04-11 (×7): qty 0.12

## 2019-04-11 MED ORDER — DEXAMETHASONE SODIUM PHOSPHATE 4 MG/ML IJ SOLN
4.0000 mg | INTRAMUSCULAR | Status: AC
  Administered 2019-04-11 – 2019-04-14 (×4): 4 mg via INTRAVENOUS
  Filled 2019-04-11 (×4): qty 1

## 2019-04-11 MED ORDER — FUROSEMIDE 10 MG/ML IJ SOLN
20.0000 mg | Freq: Once | INTRAMUSCULAR | Status: AC
  Administered 2019-04-11: 16:00:00 20 mg via INTRAVENOUS
  Filled 2019-04-11: qty 2

## 2019-04-11 MED ORDER — INSULIN ASPART 100 UNIT/ML ~~LOC~~ SOLN
4.0000 [IU] | Freq: Three times a day (TID) | SUBCUTANEOUS | Status: DC
  Administered 2019-04-11 – 2019-04-27 (×43): 4 [IU] via SUBCUTANEOUS

## 2019-04-11 MED ORDER — PANTOPRAZOLE SODIUM 40 MG PO TBEC
40.0000 mg | DELAYED_RELEASE_TABLET | Freq: Every day | ORAL | Status: DC
  Administered 2019-04-11 – 2019-05-03 (×23): 40 mg via ORAL
  Filled 2019-04-11 (×23): qty 1

## 2019-04-11 NOTE — Plan of Care (Signed)
  Problem: Education: Goal: Knowledge of risk factors and measures for prevention of condition will improve Outcome: Progressing   Problem: Coping: Goal: Psychosocial and spiritual needs will be supported Outcome: Progressing   Problem: Respiratory: Goal: Will maintain a patent airway Outcome: Progressing Goal: Complications related to the disease process, condition or treatment will be avoided or minimized Outcome: Progressing   

## 2019-04-11 NOTE — Plan of Care (Signed)
Pt tolerated 7L HFNC for most of the night, still desats with movement. No changes noted during the shift. Pt aware to call if assistance is needed. Problem: Education: Goal: Knowledge of risk factors and measures for prevention of condition will improve 04/11/2019 0544 by Josepha Pigg, RN Outcome: Progressing 04/11/2019 0543 by Josepha Pigg, RN Outcome: Progressing   Problem: Coping: Goal: Psychosocial and spiritual needs will be supported 04/11/2019 0544 by Josepha Pigg, RN Outcome: Progressing 04/11/2019 0543 by Josepha Pigg, RN Outcome: Progressing   Problem: Respiratory: Goal: Will maintain a patent airway 04/11/2019 0544 by Debroah Loop L, RN Outcome: Progressing 04/11/2019 0543 by Josepha Pigg, RN Outcome: Progressing Goal: Complications related to the disease process, condition or treatment will be avoided or minimized 04/11/2019 0544 by Josepha Pigg, RN Outcome: Progressing 04/11/2019 0543 by Josepha Pigg, RN Outcome: Progressing

## 2019-04-11 NOTE — Progress Notes (Signed)
Inpatient Diabetes Program Recommendations  AACE/ADA: New Consensus Statement on Inpatient Glycemic Control (2015)  Target Ranges:  Prepandial:   less than 140 mg/dL      Peak postprandial:   less than 180 mg/dL (1-2 hours)      Critically ill patients:  140 - 180 mg/dL   Lab Results  Component Value Date   GLUCAP 161 (H) 04/11/2019   HGBA1C 7.1 (H) 04/06/2019    Review of Glycemic Control  Results for JYRON, STUEWE (MRN [id number]) as of 04/11/2019 08:45  Ref. Range 04/10/2019 07:11 04/10/2019 11:27 04/10/2019 16:41 04/10/2019 20:05 04/11/2019 07:23  Glucose-Capillary Latest Ref Range: 70 - 99 mg/dL 127 (H) 252 (H) 121 (H) 351 (H) 161 (H)   Diabetes history: DM 2 Outpatient Diabetes medications: Amaryl 2 mg Daily, Jardiance 25 mg Daily, Metformin 2000 mg Daily, Actos 45 mg Daily Current orders for Inpatient glycemic control:  Novolog 0-20 units tid + hs Amaryl 2 mg Daily Invokana 100 mg Daily Actos 45 mg Daily  Decadron 6 mg Q24 hours BUN/Creat: 32/0.69  Inpatient Diabetes Program Recommendations:    Glucose trends increase after meal intake. Consider Novolog 3 units tid meal coverage if pt is consuming at least 50% of meals.  Thanks,  Tama Headings RN, MSN, BC-ADM Inpatient Diabetes Coordinator Team Pager [phone] (8a-5p)

## 2019-04-11 NOTE — Progress Notes (Signed)
PROGRESS NOTE                                                                                                                                                                                                             Patient Demographics:    Dustin Diaz, is a 74 y.o. male, DOB - Nov 14, 1945, [id number]  Outpatient Primary MD for the patient is Lavone Orn, MD   Admit date - 04/05/2019   LOS - 6  Chief Complaint  Patient presents with  . Covid positive  . low O2       Brief Narrative: Patient is a 74 y.o. male with PMHx of DM-2, HTN, PMR, HLD,  who presented to the hospital on 1/14 with shortness of breath-was found to have acute hypoxic respiratory failure secondary to COVID-19 pneumonia.  Hospital course complicated by slow improvement in hypoxia and lower extremity DVT.  See below for further details.   Subjective:    Marqueze Mckissic today sitting at bedside chair-no major complaints.  Still on 7-8 L of oxygen.   Assessment  & Plan :   Acute Hypoxic Resp Failure due to Covid 19 Viral pneumonia: Slowly improving-still requiring 7 L of oxygen to maintain O2 saturations.  Completed remdesivir-continue steroids but taper.  We will give 1 dose of IV Lasix to keep in negative balance.  Fever: afebrile  O2 requirements:  SpO2: (!) 88 % O2 Flow Rate (L/min): 7 L/min FiO2 (%): 100 %   COVID-19 Labs: Recent Labs    04/09/19 0100 04/10/19 0520 04/11/19 0345  DDIMER 12.57* >20.00* >20.00*  FERRITIN 569* 494* 485*  CRP 7.6* 4.2* 2.7*    No results found for: BNP  Recent Labs  Lab 04/05/19 1609  PROCALCITON 0.64    No results found for: SARSCOV2NAA   COVID-19 Medications: Steroids: 1/14>> Remdesivir: 1/14>> 1/18 Actemra: 1/15 x 1, 1/16 x 1  Prone/Incentive Spirometry: encouraged  incentive spirometry use 3-4/hour.  DVT Prophylaxis  : Eliquis  Left lower extremity DVT: Continue Eliquis-CTA  chest on 1/18 without PE.  DM-2: CBGs remain uncontrolled-start Lantus 12 units nightly, add 4 units of NovoLog with meals.  Continue SSI and follow.  CBG (last 3)  Recent Labs    04/10/19 2005 04/11/19 0723 04/11/19 1133  GLUCAP 351* 161* 212*    HTN: BP controlled-continue Avapro and Norvasc  Dyslipidemia: Continue statin  History of PMR: Resume low-dose prednisone on discharge.  Right hilar lymphadenopathy: Likely reactive-repeat imaging in 3 to 6 months as outpatient.  Nutrition Problem: Nutrition Problem: Increased nutrient needs Etiology: catabolic [id number] PNA) Signs/Symptoms: estimated needs Interventions: Ensure Enlive (each supplement provides 350kcal and 20 grams of protein), Magic cup, Hormel Shake   Consults  :  None  Procedures  :  Non  ABG: No results found for: PHART, PCO2ART, PO2ART, HCO3, TCO2, ACIDBASEDEF, O2SAT  Vent Settings: N/A   Condition - Extremely Guarded  Family Communication  :  Spouse updated over the phone  Code Status :  Full Code  Diet :  Diet Order            Diet heart healthy/carb modified Room service appropriate? Yes; Fluid consistency: Thin  Diet effective now               Disposition Plan  :  Remain hospitalized-Home when hypoxia improves.  Barriers to discharge: Hypoxia requiring O2 supplementation  Antimicorbials  :    Anti-infectives (From admission, onward)   Start     Dose/Rate Route Frequency Ordered Stop   04/06/19 1000  remdesivir 100 mg in sodium chloride 0.9 % 100 mL IVPB  Status:  Discontinued     100 mg 200 mL/hr over 30 Minutes Intravenous Daily 04/05/19 1919 04/05/19 1919   04/06/19 1000  remdesivir 100 mg in sodium chloride 0.9 % 100 mL IVPB     100 mg 200 mL/hr over 30 Minutes Intravenous Daily 04/05/19 1919 04/09/19 0948   04/06/19 0400  azithromycin (ZITHROMAX) 500 mg in sodium chloride 0.9 % 250 mL IVPB  Status:  Discontinued     500 mg 250 mL/hr over 60 Minutes Intravenous Every  24 hours 04/06/19 0242 04/06/19 0725   04/06/19 0300  cefTRIAXone (ROCEPHIN) 1 g in sodium chloride 0.9 % 100 mL IVPB  Status:  Discontinued     1 g 200 mL/hr over 30 Minutes Intravenous Every 24 hours 04/06/19 0242 04/06/19 0725   04/05/19 2100  remdesivir 200 mg in sodium chloride 0.9% 250 mL IVPB     200 mg 580 mL/hr over 30 Minutes Intravenous Once 04/05/19 1919 04/05/19 2200   04/05/19 1918  remdesivir 200 mg in sodium chloride 0.9% 250 mL IVPB  Status:  Discontinued     200 mg 580 mL/hr over 30 Minutes Intravenous Once 04/05/19 1919 04/05/19 1919      Inpatient Medications  Scheduled Meds: . irbesartan  150 mg Oral Daily   And  . amLODipine  5 mg Oral Daily  . apixaban  10 mg Oral BID   Followed by  . [START ON 04/16/2019] apixaban  5 mg Oral BID  . vitamin C  500 mg Oral Daily  . aspirin EC  81 mg Oral Daily  . atorvastatin  10 mg Oral Daily  . canagliflozin  100 mg Oral QAC breakfast  . dexamethasone (DECADRON) injection  6 mg Intravenous Q24H  . glimepiride  2 mg Oral Q breakfast  . insulin aspart  0-20 Units Subcutaneous TID WC  . insulin aspart  0-5 Units Subcutaneous QHS  . Ipratropium-Albuterol  1 puff Inhalation Q6H  . latanoprost  1 drop Both Eyes QHS  . pioglitazone  45 mg Oral Daily  . zinc sulfate  220 mg Oral Daily   Continuous Infusions: PRN Meds:.acetaminophen, chlorpheniramine-HYDROcodone, fluticasone, guaiFENesin-dextromethorphan, lip balm, ondansetron **OR** ondansetron (ZOFRAN) IV   Time Spent in minutes  25  See all Orders from today for further details   Oren Binet M.D on 04/11/2019 at 1:44 PM  To page go to [email or web address] - use universal password  [facility] -  Office  [phone]    Objective:   Vitals:   04/10/19 2300 04/11/19 0310 04/11/19 0726 04/11/19 1008  BP:  111/76 (!) 146/69   Pulse:  74 64   Resp:  19 (!) 22   Temp:  97.8 F (36.6 C) 97.7 F (36.5 C)   TempSrc:  Oral Oral   SpO2: 97% 93% (!) 87% (!) 88%    Weight:      Height:        Wt Readings from Last 3 Encounters:  04/06/19 78 kg  07/24/12 93 kg     Intake/Output Summary (Last 24 hours) at 04/11/2019 1344 Last data filed at 04/11/2019 0543 Gross per 24 hour  Intake 380 ml  Output 1700 ml  Net -1320 ml     Physical Exam Gen Exam:Alert awake-not in any distress HEENT:atraumatic, normocephalic Chest: B/L clear to auscultation anteriorly CVS:S1S2 regular Abdomen:soft non tender, non distended Extremities:no edema Neurology: Non focal Skin: no rash   Data Review:    CBC Recent Labs  Lab 04/07/19 0050 04/08/19 0030 04/09/19 0100 04/10/19 0520 04/11/19 0345  WBC 14.2* 11.5* 9.3 12.2* 13.9*  HGB 13.0 13.9 13.7 14.4 15.3  HCT 37.6* 41.1 41.7 42.7 46.3  PLT 362 402* 397 344 340  MCV 88.9 89.0 89.9 90.1 90.3  MCH 30.7 30.1 29.5 30.4 29.8  MCHC 34.6 33.8 32.9 33.7 33.0  RDW 13.5 13.7 14.0 13.9 13.8  LYMPHSABS 0.6* 0.5* 0.5* 0.7 0.6*  MONOABS 0.6 0.3 0.4 0.5 0.4  EOSABS 0.0 0.0 0.0 0.0 0.0  BASOSABS 0.0 0.0 0.1 0.1 0.0    Chemistries  Recent Labs  Lab 04/07/19 0050 04/08/19 0030 04/09/19 0100 04/10/19 0520 04/11/19 0345  NA 137 138 136 136 134*  K 4.8 4.9 4.7 4.6 4.7  CL 105 106 104 102 100  CO2 23 21* 22 24 24   GLUCOSE 209* 163* 132* 149* 171*  BUN 39* 35* 31* 30* 32*  CREATININE 0.93 0.82 0.75 0.76 0.69  CALCIUM 8.2* 8.3* 8.3* 8.5* 8.6*  AST 25 26 33 24 23  ALT 22 22 26 23 21   ALKPHOS 83 67 70 85 88  BILITOT 0.7 0.5 1.0 0.9 1.3*   ------------------------------------------------------------------------------------------------------------------ No results for input(s): CHOL, HDL, LDLCALC, TRIG, CHOLHDL, LDLDIRECT in the last 72 hours.  Lab Results  Component Value Date   HGBA1C 7.1 (H) 04/06/2019   ------------------------------------------------------------------------------------------------------------------ No results for input(s): TSH, T4TOTAL, T3FREE, THYROIDAB in the last 72  hours.  Invalid input(s): FREET3 ------------------------------------------------------------------------------------------------------------------ Recent Labs    04/10/19 0520 04/11/19 0345  FERRITIN 494* 485*    Coagulation profile No results for input(s): INR, PROTIME in the last 168 hours.  Recent Labs    04/10/19 0520 04/11/19 0345  DDIMER >20.00* >20.00*    Cardiac Enzymes No results for input(s): CKMB, TROPONINI, MYOGLOBIN in the last 168 hours.  Invalid input(s): CK ------------------------------------------------------------------------------------------------------------------ No results found for: BNP  Micro Results Recent Results (from the past 240 hour(s))  Blood Culture (routine x 2)     Status: None   Collection Time: 04/05/19  4:08 PM   Specimen: BLOOD LEFT HAND  Result Value Ref Range Status   Specimen Description   Final    BLOOD LEFT HAND Performed at [facility] [address] [place], [place] [address]  Special Requests   Final    BOTTLES DRAWN AEROBIC AND ANAEROBIC Blood Culture adequate volume Performed at [facility] [address], [place], [place] [address]    Culture   Final    NO GROWTH 5 DAYS Performed at [facility], [place] [address], [place], [place] [address]    Report Status 04/10/2019 FINAL  Final  Blood Culture (routine x 2)     Status: None   Collection Time: 04/05/19  4:19 PM   Specimen: BLOOD LEFT HAND  Result Value Ref Range Status   Specimen Description   Final    BLOOD LEFT HAND Performed at [facility] [address], [place], [place] [address]    Special Requests   Final    BOTTLES DRAWN AEROBIC ONLY Blood Culture adequate volume Performed at [facility] [address] [place], [place] [address]    Culture   Final    NO GROWTH 5 DAYS Performed at [facility], [place] [address] [place], [place] [address]    Report  Status 04/10/2019 FINAL  Final    Radiology Reports CT ANGIO CHEST PE W OR WO CONTRAST  Result Date: 04/09/2019 CLINICAL DATA:  COVID pneumonia, hypoxia, shortness of breath and elevated D-dimer. EXAM: CT ANGIOGRAPHY CHEST WITH CONTRAST TECHNIQUE: Multidetector CT imaging of the chest was performed using the standard protocol during bolus administration of intravenous contrast. Multiplanar CT image reconstructions and MIPs were obtained to evaluate the vascular anatomy. CONTRAST:  79mL OMNIPAQUE IOHEXOL 350 MG/ML SOLN COMPARISON:  Chest x-ray on 04/05/2019 FINDINGS: Cardiovascular: The pulmonary arteries are well opacified. There is no evidence pulmonary embolism. Central pulmonary arteries are normal in caliber. The heart size is normal. The thoracic aorta is normal in caliber. Trace amount of pericardial fluid. Calcified plaque is noted of the coronary arteries primarily in the distribution of the LAD. Mediastinum/Nodes: Mildly prominent right hilar nodal tissue measuring up to 1.5 cm. No evidence of enlarged mediastinal or axillary lymph nodes. Lungs/Pleura: Severe airspace disease is seen throughout all lobes of both lungs in a ground-glass pattern. There is an associated very small right pleural effusion. No pneumothorax. Upper Abdomen: No acute abnormality. Musculoskeletal: No chest wall abnormality. No acute or significant osseous findings. Review of the MIP images confirms the above findings. IMPRESSION: 1. No evidence of pulmonary embolism. 2. Severe airspace disease throughout all lobes of both lungs in a ground-glass pattern. Findings are consistent with severe bilateral COVID-19 pneumonia. 3. Mildly prominent right hilar nodal tissue measuring up to 1.5 cm in short axis. This is likely reactive. 4. Coronary artery disease with calcified plaque in the distribution of the LAD. Electronically Signed   By: Aletta Edouard M.D.   On: 04/09/2019 08:58   DG Chest Port 1 View  Result Date:  04/05/2019 CLINICAL DATA:  COVID positive low oxygen level EXAM: PORTABLE CHEST 1 VIEW COMPARISON:  None. FINDINGS: Bilateral ground-glass opacities and consolidations. No pleural effusion. Normal heart size. Aortic atherosclerosis. No pneumothorax. IMPRESSION: Bilateral ground-glass opacities and consolidations consistent with bilateral pneumonia. Electronically Signed   By: Donavan Foil M.D.   On: 04/05/2019 16:47   VAS [place] LOWER EXTREMITY VENOUS (DVT)  Result Date: 04/09/2019  Lower Venous Study Indications: Covid positive with worsening positive d-Dimer and hypoxia.  Anticoagulation: Lovenox. Comparison Study: No priors. Performing Technologist: Oda Cogan RDMS, RVT  Examination Guidelines: A complete evaluation includes B-mode imaging, spectral Doppler, color Doppler, and power Doppler as needed of  all accessible portions of each vessel. Bilateral testing is considered an integral part of a complete examination. Limited examinations for reoccurring indications may be performed as noted.  +---------+---------------+---------+-----------+----------+--------------+ RIGHT    CompressibilityPhasicitySpontaneityPropertiesThrombus Aging +---------+---------------+---------+-----------+----------+--------------+ CFV      Full           Yes      Yes                                 +---------+---------------+---------+-----------+----------+--------------+ SFJ      Full                                                        +---------+---------------+---------+-----------+----------+--------------+ FV Prox  Full                                                        +---------+---------------+---------+-----------+----------+--------------+ FV Mid   Full                                                        +---------+---------------+---------+-----------+----------+--------------+ FV DistalFull                                                         +---------+---------------+---------+-----------+----------+--------------+ PFV      Full                                                        +---------+---------------+---------+-----------+----------+--------------+ POP      Full           Yes      Yes                                 +---------+---------------+---------+-----------+----------+--------------+ PTV      Full                                                        +---------+---------------+---------+-----------+----------+--------------+ PERO     Full                                                        +---------+---------------+---------+-----------+----------+--------------+   +---------+---------------+---------+-----------+----------+--------------+ LEFT     CompressibilityPhasicitySpontaneityPropertiesThrombus Aging +---------+---------------+---------+-----------+----------+--------------+ CFV      Full  Yes      Yes                                 +---------+---------------+---------+-----------+----------+--------------+ SFJ      Full                                                        +---------+---------------+---------+-----------+----------+--------------+ FV Prox  Full                                                        +---------+---------------+---------+-----------+----------+--------------+ FV Mid   Full                                                        +---------+---------------+---------+-----------+----------+--------------+ FV DistalFull                                                        +---------+---------------+---------+-----------+----------+--------------+ PFV      Full                                                        +---------+---------------+---------+-----------+----------+--------------+ POP      Full           Yes      Yes                                  +---------+---------------+---------+-----------+----------+--------------+ PTV      Full                                                        +---------+---------------+---------+-----------+----------+--------------+ PERO     Partial                                      Acute          +---------+---------------+---------+-----------+----------+--------------+ Soleal   Full                                         Acute          +---------+---------------+---------+-----------+----------+--------------+     Summary: Right: There is no evidence of deep vein thrombosis in the lower extremity. No cystic structure found in the popliteal  fossa. Left: Findings consistent with acute deep vein thrombosis involving the left peroneal veins, and left soleal veins. Short segment of localized thrombus in the peroneal and soleal veins in the mid segment.  *See table(s) above for measurements and observations. Electronically signed by Ruta Hinds MD on 04/09/2019 at 2:56:25 PM.    Final

## 2019-04-11 NOTE — Evaluation (Signed)
Physical Therapy Evaluation Patient Details Name: Dustin Diaz MRN: [id number] DOB: 1946/03/08 Today's Date: 04/11/2019   History of Present Illness  74 year old male admitted with Covid /Pneumonia. PMH includes DM, HTN, hyperlipidemia, polymyalgia rheumatica.  Clinical Impression  Very pleasant and cooperative male patient with low endurance, and easily desats currently. He was not using oxygen or any assistive device at home. He resides with a spouse and daughter in one level home, 5 outside steps with handrails, walk in shower with separate seat, and grab bars on commode with elevated seat. He was independent in all ADLs. Of note, his spouse and daughter both have COVID.     Follow Up Recommendations      Equipment Recommendations  None recommended by PT    Recommendations for Other Services       Precautions / Restrictions Precautions Precautions: Fall(Tends to desat very easily- monitor closely) Restrictions Weight Bearing Restrictions: No      Mobility  Bed Mobility Overal bed mobility: Independent                Transfers Overall transfer level: Independent Equipment used: None                Ambulation/Gait Ambulation/Gait assistance: Supervision(With oxygen and monitoring SPO2 sats closely) Gait Distance (Feet): 30 Feet   Gait Pattern/deviations: WFL(Within Functional Limits)   Gait velocity interpretation: 1.31 - 2.62 ft/sec, indicative of limited community Conservation officer, historic buildings Rankin (Stroke Patients Only)       Balance Overall balance assessment: Mild deficits observed, not formally tested                                           Pertinent Vitals/Pain      Home Living Family/patient expects to be discharged to:: Private residence Living Arrangements: Spouse/significant other Available Help at Discharge: Family Type of Home: House Home Access: Stairs to  enter Entrance Stairs-Rails: Right Entrance Stairs-Number of Steps: 5 Home Layout: One level Home Equipment: Shower seat;Grab bars - toilet Additional Comments: He was independent in all ADLs including driving. No AD for ambulation    Prior Function Level of Independence: Independent               Hand Dominance   Dominant Hand: Right    Extremity/Trunk Assessment   Upper Extremity Assessment Upper Extremity Assessment: Defer to OT evaluation    Lower Extremity Assessment Lower Extremity Assessment: Defer to PT evaluation;Generalized weakness    Cervical / Trunk Assessment Cervical / Trunk Assessment: Normal  Communication   Communication: No difficulties  Cognition Arousal/Alertness: Awake/alert Behavior During Therapy: WFL for tasks assessed/performed Overall Cognitive Status: Within Functional Limits for tasks assessed                                 General Comments: Very pleasant- but even with simple conversation, he tends to desat.      General Comments General comments (skin integrity, edema, etc.): Fair tone and turgor- no edema noted    Exercises General Exercises - Lower Extremity Ankle Circles/Pumps: AROM;Seated Quad Sets: AROM;Seated Short Arc Quad: AROM;Seated Long Arc Quad: AROM;Seated Straight Leg Raises: AROM;Seated Hip Flexion/Marching: AROM;Seated Other Exercises Other Exercises: Practiced IS  and Flutter valve. Able to achieve 1000 on IS for 8 15f 10.Kermit Balo return demo with FLUTTER valve. Reminded to use hourly with 8-10 breaths. Other Exercises: With LE/UE ex and use of theraband- printed sheet and light resistance theraband issued.   Assessment/Plan    PT Assessment    PT Problem List         PT Treatment Interventions      PT Goals (Current goals can be found in the Care Plan section)  Acute Rehab PT Goals Patient Stated Goal: Just want to be able to get home with the family PT Goal Formulation: With patient Time  For Goal Achievement: 04/25/19 Potential to Achieve Goals: Good    Frequency     Barriers to discharge        Co-evaluation               AM-PAC PT "6 Clicks" Mobility  Outcome Measure Help needed turning from your back to your side while in a flat bed without using bedrails?: None Help needed moving from lying on your back to sitting on the side of a flat bed without using bedrails?: None Help needed moving to and from a bed to a chair (including a wheelchair)?: None Help needed standing up from a chair using your arms (e.g., wheelchair or bedside chair)?: None Help needed to walk in hospital room?: A Little Help needed climbing 3-5 steps with a railing? : A Lot 6 Click Score: 21    End of Session   Activity Tolerance: Other (comment)(Distance good, but he did desat ot 82, and 78 when he sat down in the BS chair after ambulation. Took 8 minutes to recover to >90%) Patient left: in chair;with call bell/phone within reach Nurse Communication: Mobility status PT Visit Diagnosis: Muscle weakness (generalized) (M62.81)    Time: [id number] PT Time Calculation (min) (ACUTE ONLY): 45 min   Charges:   PT Evaluation $PT Eval High Complexity: 1 High PT Treatments $Gait Training: 8-22 mins $Therapeutic Exercise: 8-22 mins        Rollen Sox, PT # [phone] CGV cell   Casandra Doffing 04/11/2019, 10:19 AM

## 2019-04-12 LAB — CBC WITH DIFFERENTIAL/PLATELET
Abs Immature Granulocytes: 0.65 10*3/uL — ABNORMAL HIGH (ref 0.00–0.07)
Basophils Absolute: 0.1 10*3/uL (ref 0.0–0.1)
Basophils Relative: 1 %
Eosinophils Absolute: 0 10*3/uL (ref 0.0–0.5)
Eosinophils Relative: 0 %
HCT: 42.5 % (ref 39.0–52.0)
Hemoglobin: 14.3 g/dL (ref 13.0–17.0)
Immature Granulocytes: 5 %
Lymphocytes Relative: 3 %
Lymphs Abs: 0.4 10*3/uL — ABNORMAL LOW (ref 0.7–4.0)
MCH: 30.2 pg (ref 26.0–34.0)
MCHC: 33.6 g/dL (ref 30.0–36.0)
MCV: 89.7 fL (ref 80.0–100.0)
Monocytes Absolute: 0.5 10*3/uL (ref 0.1–1.0)
Monocytes Relative: 4 %
Neutro Abs: 11.2 10*3/uL — ABNORMAL HIGH (ref 1.7–7.7)
Neutrophils Relative %: 87 %
Platelets: 301 10*3/uL (ref 150–400)
RBC: 4.74 MIL/uL (ref 4.22–5.81)
RDW: 13.8 % (ref 11.5–15.5)
WBC: 12.8 10*3/uL — ABNORMAL HIGH (ref 4.0–10.5)
nRBC: 0 % (ref 0.0–0.2)

## 2019-04-12 LAB — COMPREHENSIVE METABOLIC PANEL
ALT: 20 U/L (ref 0–44)
AST: 21 U/L (ref 15–41)
Albumin: 2.9 g/dL — ABNORMAL LOW (ref 3.5–5.0)
Alkaline Phosphatase: 89 U/L (ref 38–126)
Anion gap: 10 (ref 5–15)
BUN: 33 mg/dL — ABNORMAL HIGH (ref 8–23)
CO2: 24 mmol/L (ref 22–32)
Calcium: 8.3 mg/dL — ABNORMAL LOW (ref 8.9–10.3)
Chloride: 101 mmol/L (ref 98–111)
Creatinine, Ser: 0.86 mg/dL (ref 0.61–1.24)
GFR calc Af Amer: >60 mL/min (ref >60)
GFR calc non Af Amer: >60 mL/min (ref >60)
Glucose, Bld: 205 mg/dL — ABNORMAL HIGH (ref 70–99)
Potassium: 4.3 mmol/L (ref 3.5–5.1)
Sodium: 135 mmol/L (ref 135–145)
Total Bilirubin: 0.8 mg/dL (ref 0.3–1.2)
Total Protein: 5.6 g/dL — ABNORMAL LOW (ref 6.5–8.1)

## 2019-04-12 LAB — GLUCOSE, CAPILLARY
Glucose-Capillary: 153 mg/dL — ABNORMAL HIGH (ref 70–99)
Glucose-Capillary: 208 mg/dL — ABNORMAL HIGH (ref 70–99)

## 2019-04-12 LAB — C-REACTIVE PROTEIN: CRP: 1.4 mg/dL — ABNORMAL HIGH (ref <1.0)

## 2019-04-12 LAB — D-DIMER, QUANTITATIVE: D-Dimer, Quant: 17.57 ug/mL-FEU — ABNORMAL HIGH (ref 0.00–0.50)

## 2019-04-12 LAB — FERRITIN: Ferritin: 421 ng/mL — ABNORMAL HIGH (ref 24–336)

## 2019-04-12 MED ORDER — FUROSEMIDE 10 MG/ML IJ SOLN
20.0000 mg | Freq: Once | INTRAMUSCULAR | Status: AC
  Administered 2019-04-12: 20 mg via INTRAVENOUS
  Filled 2019-04-12: qty 2

## 2019-04-12 MED ORDER — DIPHENHYDRAMINE HCL 50 MG/ML IJ SOLN: 50.0000 mg | Freq: Once | INTRAMUSCULAR | Status: DC | PRN

## 2019-04-12 MED ORDER — ALBUTEROL SULFATE HFA 108 (90 BASE) MCG/ACT IN AERS: 2.0000 | INHALATION_SPRAY | Freq: Once | RESPIRATORY_TRACT | Status: DC | PRN

## 2019-04-12 MED ORDER — EPINEPHRINE 0.3 MG/0.3ML IJ SOAJ: 0.3000 mg | Freq: Once | INTRAMUSCULAR | Status: DC | PRN

## 2019-04-12 MED ORDER — SODIUM CHLORIDE 0.9 % IV SOLN: 700.0000 mg | Freq: Once | INTRAVENOUS | Status: DC

## 2019-04-12 MED ORDER — FAMOTIDINE IN NACL 20-0.9 MG/50ML-% IV SOLN: 20.0000 mg | Freq: Once | INTRAVENOUS | Status: DC | PRN

## 2019-04-12 MED ORDER — SODIUM CHLORIDE 0.9 % IV SOLN: INTRAVENOUS | Status: DC | PRN

## 2019-04-12 MED ORDER — METHYLPREDNISOLONE SODIUM SUCC 125 MG IJ SOLR: 125.0000 mg | Freq: Once | INTRAMUSCULAR | Status: DC | PRN

## 2019-04-12 NOTE — Progress Notes (Signed)
Initial Nutrition Assessment  DOCUMENTATION CODES:   Not applicable  INTERVENTION:   Pt receiving Hormel Shake daily with Breakfast which provides 520 kcals and 22 g of protein and Magic cup BID with lunch and dinner, each supplement provides 290 kcal and 9 grams of protein, automatically on meal trays to optimize nutritional intake.   Encourage PO intake    NUTRITION DIAGNOSIS:   Increased nutrient needs related to catabolic [id number] PNA) as evidenced by estimated needs. Ongoing.   GOAL:   Patient will meet greater than or equal to 90% of their needs Progressing.   MONITOR:   PO intake, Supplement acceptance  REASON FOR ASSESSMENT:   Malnutrition Screening Tool    ASSESSMENT:   Pt with PMH of DM, HTN, HLD, and polymyalgia rheumatica who is admitted with SOB x 1 week when dx with COVID-19.   Per RN pt is eating and drinking well.  Pt remains on 7-8 L O2 via HFNC with O2 sats in the high 80s. PT following and reports poor endurance.   Meal Completion: 50-75% of meals.   Medications reviewed and include: vitamin C, decadron, SSI with meals/bedtime, 4 units novolog with meals, 12 units lantus, actos, zinc  Remdesivir  Labs reviewed: CBG's: [phone] Lab Results  Component Value Date   HGBA1C 7.1 (H) 04/06/2019     NUTRITION - FOCUSED PHYSICAL EXAM:  Deferred   Diet Order:   Diet Order            Diet heart healthy/carb modified Room service appropriate? Yes; Fluid consistency: Thin  Diet effective now              EDUCATION NEEDS:   Not appropriate for education at this time  Skin:  Skin Assessment: Reviewed RN Assessment  Last BM:  1/17  Height:   Ht Readings from Last 1 Encounters:  04/06/19 5\' 10"  (1.778 m)    Weight:   Wt Readings from Last 1 Encounters:  04/06/19 78 kg    Ideal Body Weight:  75.4 kg  BMI:  Body mass index is 24.67 kg/m.  Estimated Nutritional Needs:   Kcal:  1950-2300  Protein:  115-132  grams  Fluid:  >1.9 L/day  Maylon Peppers RD, LDN, CNSC [phone] Pager [phone] After Hours Pager

## 2019-04-12 NOTE — Plan of Care (Signed)
  Problem: Education: Goal: Knowledge of risk factors and measures for prevention of condition will improve Outcome: Progressing   Problem: Coping: Goal: Psychosocial and spiritual needs will be supported Outcome: Progressing   Problem: Respiratory: Goal: Will maintain a patent airway Outcome: Progressing Goal: Complications related to the disease process, condition or treatment will be avoided or minimized Outcome: Progressing   

## 2019-04-12 NOTE — Progress Notes (Signed)
PROGRESS NOTE                                                                                                                                                                                                             Patient Demographics:    Dustin Diaz, is a 74 y.o. male, DOB - 1945-06-04, [id number]  Outpatient Primary MD for the patient is Lavone Orn, MD   Admit date - 04/05/2019   LOS - 7  Chief Complaint  Patient presents with  . Covid positive  . low O2       Brief Narrative: Patient is a 74 y.o. male with PMHx of DM-2, HTN, PMR, HLD,  who presented to the hospital on 1/14 with shortness of breath-was found to have acute hypoxic respiratory failure secondary to COVID-19 pneumonia.  Hospital course complicated by slow improvement in hypoxia and lower extremity DVT.  See below for further details.   Subjective:    Dustin Diaz was stable at rest-he was sitting at the bedside chair.  At rest he requires around 4-5 L of oxygen, but with minimal ambulation-requires around 15 L of high flow oxygen.   Assessment  & Plan :   Acute Hypoxic Resp Failure due to Covid 19 Viral pneumonia: Slowly improving-Down to 4-5 L of oxygen at rest-but with minimal ambulation requires approximately 15 L of oxygen.  He does feel short of breath with just moving around in the room.  Continue tapering steroids-has finished a course of remdesivir.    Fever: afebrile  O2 requirements:  SpO2: 92 % O2 Flow Rate (L/min): 5 L/min FiO2 (%): 100 %   COVID-19 Labs: Recent Labs    04/10/19 0520 04/11/19 0345 04/12/19 0136  DDIMER >20.00* >20.00* 17.57*  FERRITIN 494* 485* 421*  CRP 4.2* 2.7* 1.4*    No results found for: BNP  Recent Labs  Lab 04/05/19 1609  PROCALCITON 0.64    No results found for: SARSCOV2NAA   COVID-19 Medications: Steroids: 1/14>> Remdesivir: 1/14>> 1/18 Actemra: 1/15 x 1, 1/16 x  1  Prone/Incentive Spirometry: encouraged  incentive spirometry use 3-4/hour.  DVT Prophylaxis  : Eliquis  Left lower extremity DVT: Continue Eliquis-CTA chest on 1/18 without PE.  DM-2: CBGs better controlled-continue Lantus 12 units nightly, 4 units of NovoLog with meals and SSI.  Follow and optimize.  CBG (last 3)  Recent Labs    04/11/19 1611 04/11/19 2011 04/12/19 0712  GLUCAP 146* 241* 153*    HTN: BP controlled-continue Avapro and Norvasc  Dyslipidemia: Continue statin  History of PMR: Resume low-dose prednisone on discharge.  Right hilar lymphadenopathy: Likely reactive-repeat imaging in 3 to 6 months as outpatient.  Nutrition Problem: Nutrition Problem: Increased nutrient needs Etiology: catabolic [id number] PNA) Signs/Symptoms: estimated needs Interventions: Ensure Enlive (each supplement provides 350kcal and 20 grams of protein), Magic cup, Hormel Shake   Consults  :  None  Procedures  :  Non  ABG: No results found for: PHART, PCO2ART, PO2ART, HCO3, TCO2, ACIDBASEDEF, O2SAT  Vent Settings: N/A   Condition - Extremely Guarded  Family Communication  :  Spouse updated over the phone 1/21  Code Status :  Full Code  Diet :  Diet Order            Diet heart healthy/carb modified Room service appropriate? Yes; Fluid consistency: Thin  Diet effective now               Disposition Plan  :  Remain hospitalized-Home when hypoxia improves.  Barriers to discharge: Hypoxia requiring O2 supplementation  Antimicorbials  :    Anti-infectives (From admission, onward)   Start     Dose/Rate Route Frequency Ordered Stop   04/06/19 1000  remdesivir 100 mg in sodium chloride 0.9 % 100 mL IVPB  Status:  Discontinued     100 mg 200 mL/hr over 30 Minutes Intravenous Daily 04/05/19 1919 04/05/19 1919   04/06/19 1000  remdesivir 100 mg in sodium chloride 0.9 % 100 mL IVPB     100 mg 200 mL/hr over 30 Minutes Intravenous Daily 04/05/19 1919 04/09/19 0948    04/06/19 0400  azithromycin (ZITHROMAX) 500 mg in sodium chloride 0.9 % 250 mL IVPB  Status:  Discontinued     500 mg 250 mL/hr over 60 Minutes Intravenous Every 24 hours 04/06/19 0242 04/06/19 0725   04/06/19 0300  cefTRIAXone (ROCEPHIN) 1 g in sodium chloride 0.9 % 100 mL IVPB  Status:  Discontinued     1 g 200 mL/hr over 30 Minutes Intravenous Every 24 hours 04/06/19 0242 04/06/19 0725   04/05/19 2100  remdesivir 200 mg in sodium chloride 0.9% 250 mL IVPB     200 mg 580 mL/hr over 30 Minutes Intravenous Once 04/05/19 1919 04/05/19 2200   04/05/19 1918  remdesivir 200 mg in sodium chloride 0.9% 250 mL IVPB  Status:  Discontinued     200 mg 580 mL/hr over 30 Minutes Intravenous Once 04/05/19 1919 04/05/19 1919      Inpatient Medications  Scheduled Meds: . irbesartan  150 mg Oral Daily   And  . amLODipine  5 mg Oral Daily  . apixaban  10 mg Oral BID   Followed by  . [START ON 04/16/2019] apixaban  5 mg Oral BID  . vitamin C  500 mg Oral Daily  . atorvastatin  10 mg Oral Daily  . dexamethasone (DECADRON) injection  4 mg Intravenous Q24H  . insulin aspart  0-20 Units Subcutaneous TID WC  . insulin aspart  0-5 Units Subcutaneous QHS  . insulin aspart  4 Units Subcutaneous TID WC  . insulin glargine  12 Units Subcutaneous QHS  . Ipratropium-Albuterol  1 puff Inhalation Q6H  . latanoprost  1 drop Both Eyes QHS  . pantoprazole  40 mg Oral Daily  . pioglitazone  45 mg Oral Daily  . zinc  sulfate  220 mg Oral Daily   Continuous Infusions: PRN Meds:.acetaminophen, chlorpheniramine-HYDROcodone, fluticasone, guaiFENesin-dextromethorphan, lip balm, ondansetron **OR** ondansetron (ZOFRAN) IV   Time Spent in minutes  25  See all Orders from today for further details   Oren Binet M.D on 04/12/2019 at 2:34 PM  To page go to [email or web address] - use universal password  [facility] -  Office  [phone]    Objective:   Vitals:   04/11/19 1613 04/11/19 1935 04/11/19 2344  04/12/19 0437  BP: (!) 103/47 (!) 100/49 (!) 115/52 116/65  Pulse: 65 74 72 62  Resp: 19 20 19 20   Temp: 97.6 F (36.4 C) 98.9 F (37.2 C) 97.7 F (36.5 C) 98.3 F (36.8 C)  TempSrc: Oral Oral Oral Oral  SpO2: 93% 95% 94% 92%  Weight:      Height:        Wt Readings from Last 3 Encounters:  04/06/19 78 kg  07/24/12 93 kg     Intake/Output Summary (Last 24 hours) at 04/12/2019 1434 Last data filed at 04/12/2019 0606 Gross per 24 hour  Intake 720 ml  Output 2950 ml  Net -2230 ml     Physical Exam Gen Exam:Alert awake-not in any distress HEENT:atraumatic, normocephalic Chest: B/L clear to auscultation anteriorly CVS:S1S2 regular Abdomen:soft non tender, non distended Extremities:no edema Neurology: Non focal Skin: no rash   Data Review:    CBC Recent Labs  Lab 04/08/19 0030 04/09/19 0100 04/10/19 0520 04/11/19 0345 04/12/19 0136  WBC 11.5* 9.3 12.2* 13.9* 12.8*  HGB 13.9 13.7 14.4 15.3 14.3  HCT 41.1 41.7 42.7 46.3 42.5  PLT 402* 397 344 340 301  MCV 89.0 89.9 90.1 90.3 89.7  MCH 30.1 29.5 30.4 29.8 30.2  MCHC 33.8 32.9 33.7 33.0 33.6  RDW 13.7 14.0 13.9 13.8 13.8  LYMPHSABS 0.5* 0.5* 0.7 0.6* 0.4*  MONOABS 0.3 0.4 0.5 0.4 0.5  EOSABS 0.0 0.0 0.0 0.0 0.0  BASOSABS 0.0 0.1 0.1 0.0 0.1    Chemistries  Recent Labs  Lab 04/08/19 0030 04/09/19 0100 04/10/19 0520 04/11/19 0345 04/12/19 0136  NA 138 136 136 134* 135  K 4.9 4.7 4.6 4.7 4.3  CL 106 104 102 100 101  CO2 21* 22 24 24 24   GLUCOSE 163* 132* 149* 171* 205*  BUN 35* 31* 30* 32* 33*  CREATININE 0.82 0.75 0.76 0.69 0.86  CALCIUM 8.3* 8.3* 8.5* 8.6* 8.3*  AST 26 33 24 23 21   ALT 22 26 23 21 20   ALKPHOS 67 70 85 88 89  BILITOT 0.5 1.0 0.9 1.3* 0.8   ------------------------------------------------------------------------------------------------------------------ No results for input(s): CHOL, HDL, LDLCALC, TRIG, CHOLHDL, LDLDIRECT in the last 72 hours.  Lab Results  Component Value  Date   HGBA1C 7.1 (H) 04/06/2019   ------------------------------------------------------------------------------------------------------------------ No results for input(s): TSH, T4TOTAL, T3FREE, THYROIDAB in the last 72 hours.  Invalid input(s): FREET3 ------------------------------------------------------------------------------------------------------------------ Recent Labs    04/11/19 0345 04/12/19 0136  FERRITIN 485* 421*    Coagulation profile No results for input(s): INR, PROTIME in the last 168 hours.  Recent Labs    04/11/19 0345 04/12/19 0136  DDIMER >20.00* 17.57*    Cardiac Enzymes No results for input(s): CKMB, TROPONINI, MYOGLOBIN in the last 168 hours.  Invalid input(s): CK ------------------------------------------------------------------------------------------------------------------ No results found for: BNP  Micro Results Recent Results (from the past 240 hour(s))  Blood Culture (routine x 2)     Status: None   Collection Time: 04/05/19  4:08 PM   Specimen: BLOOD  LEFT HAND  Result Value Ref Range Status   Specimen Description   Final    BLOOD LEFT HAND Performed at [facility] [address], [place], [place] [address]    Special Requests   Final    BOTTLES DRAWN AEROBIC AND ANAEROBIC Blood Culture adequate volume Performed at [facility] [address] [place], [place] [address]    Culture   Final    NO GROWTH 5 DAYS Performed at [facility], [place] [address], [place], [place] [address]    Report Status 04/10/2019 FINAL  Final  Blood Culture (routine x 2)     Status: None   Collection Time: 04/05/19  4:19 PM   Specimen: BLOOD LEFT HAND  Result Value Ref Range Status   Specimen Description   Final    BLOOD LEFT HAND Performed at [facility] [address] [place], [place] [address]    Special Requests   Final    BOTTLES DRAWN AEROBIC ONLY Blood Culture adequate  volume Performed at [facility] [address], [place], [place] [address]    Culture   Final    NO GROWTH 5 DAYS Performed at [facility], [place] [address], [place], [place] [address]    Report Status 04/10/2019 FINAL  Final    Radiology Reports CT ANGIO CHEST PE W OR WO CONTRAST  Result Date: 04/09/2019 CLINICAL DATA:  COVID pneumonia, hypoxia, shortness of breath and elevated D-dimer. EXAM: CT ANGIOGRAPHY CHEST WITH CONTRAST TECHNIQUE: Multidetector CT imaging of the chest was performed using the standard protocol during bolus administration of intravenous contrast. Multiplanar CT image reconstructions and MIPs were obtained to evaluate the vascular anatomy. CONTRAST:  58mL OMNIPAQUE IOHEXOL 350 MG/ML SOLN COMPARISON:  Chest x-ray on 04/05/2019 FINDINGS: Cardiovascular: The pulmonary arteries are well opacified. There is no evidence pulmonary embolism. Central pulmonary arteries are normal in caliber. The heart size is normal. The thoracic aorta is normal in caliber. Trace amount of pericardial fluid. Calcified plaque is noted of the coronary arteries primarily in the distribution of the LAD. Mediastinum/Nodes: Mildly prominent right hilar nodal tissue measuring up to 1.5 cm. No evidence of enlarged mediastinal or axillary lymph nodes. Lungs/Pleura: Severe airspace disease is seen throughout all lobes of both lungs in a ground-glass pattern. There is an associated very small right pleural effusion. No pneumothorax. Upper Abdomen: No acute abnormality. Musculoskeletal: No chest wall abnormality. No acute or significant osseous findings. Review of the MIP images confirms the above findings. IMPRESSION: 1. No evidence of pulmonary embolism. 2. Severe airspace disease throughout all lobes of both lungs in a ground-glass pattern. Findings are consistent with severe bilateral COVID-19 pneumonia. 3. Mildly prominent right hilar nodal tissue measuring up to 1.5 cm in short axis.  This is likely reactive. 4. Coronary artery disease with calcified plaque in the distribution of the LAD. Electronically Signed   By: Aletta Edouard M.D.   On: 04/09/2019 08:58   DG Chest Port 1 View  Result Date: 04/05/2019 CLINICAL DATA:  COVID positive low oxygen level EXAM: PORTABLE CHEST 1 VIEW COMPARISON:  None. FINDINGS: Bilateral ground-glass opacities and consolidations. No pleural effusion. Normal heart size. Aortic atherosclerosis. No pneumothorax. IMPRESSION: Bilateral ground-glass opacities and consolidations consistent with bilateral pneumonia. Electronically Signed   By: Donavan Foil M.D.   On: 04/05/2019 16:47   VAS [place] LOWER EXTREMITY VENOUS (DVT)  Result Date: 04/09/2019  Lower Venous Study Indications: Covid positive with  worsening positive d-Dimer and hypoxia.  Anticoagulation: Lovenox. Comparison Study: No priors. Performing Technologist: Oda Cogan RDMS, RVT  Examination Guidelines: A complete evaluation includes B-mode imaging, spectral Doppler, color Doppler, and power Doppler as needed of all accessible portions of each vessel. Bilateral testing is considered an integral part of a complete examination. Limited examinations for reoccurring indications may be performed as noted.  +---------+---------------+---------+-----------+----------+--------------+ RIGHT    CompressibilityPhasicitySpontaneityPropertiesThrombus Aging +---------+---------------+---------+-----------+----------+--------------+ CFV      Full           Yes      Yes                                 +---------+---------------+---------+-----------+----------+--------------+ SFJ      Full                                                        +---------+---------------+---------+-----------+----------+--------------+ FV Prox  Full                                                        +---------+---------------+---------+-----------+----------+--------------+ FV Mid   Full                                                         +---------+---------------+---------+-----------+----------+--------------+ FV DistalFull                                                        +---------+---------------+---------+-----------+----------+--------------+ PFV      Full                                                        +---------+---------------+---------+-----------+----------+--------------+ POP      Full           Yes      Yes                                 +---------+---------------+---------+-----------+----------+--------------+ PTV      Full                                                        +---------+---------------+---------+-----------+----------+--------------+ PERO     Full                                                        +---------+---------------+---------+-----------+----------+--------------+   +---------+---------------+---------+-----------+----------+--------------+  LEFT     CompressibilityPhasicitySpontaneityPropertiesThrombus Aging +---------+---------------+---------+-----------+----------+--------------+ CFV      Full           Yes      Yes                                 +---------+---------------+---------+-----------+----------+--------------+ SFJ      Full                                                        +---------+---------------+---------+-----------+----------+--------------+ FV Prox  Full                                                        +---------+---------------+---------+-----------+----------+--------------+ FV Mid   Full                                                        +---------+---------------+---------+-----------+----------+--------------+ FV DistalFull                                                        +---------+---------------+---------+-----------+----------+--------------+ PFV      Full                                                         +---------+---------------+---------+-----------+----------+--------------+ POP      Full           Yes      Yes                                 +---------+---------------+---------+-----------+----------+--------------+ PTV      Full                                                        +---------+---------------+---------+-----------+----------+--------------+ PERO     Partial                                      Acute          +---------+---------------+---------+-----------+----------+--------------+ Soleal   Full                                         Acute          +---------+---------------+---------+-----------+----------+--------------+  Summary: Right: There is no evidence of deep vein thrombosis in the lower extremity. No cystic structure found in the popliteal fossa. Left: Findings consistent with acute deep vein thrombosis involving the left peroneal veins, and left soleal veins. Short segment of localized thrombus in the peroneal and soleal veins in the mid segment.  *See table(s) above for measurements and observations. Electronically signed by Ruta Hinds MD on 04/09/2019 at 2:56:25 PM.    Final

## 2019-04-12 NOTE — Progress Notes (Signed)
Occupational Therapy Evaluation Patient Details Name: Dustin Diaz MRN: [id number] DOB: 07/10/1945 Today's Date: 04/12/2019    History of Present Illness 74 year old male admitted with Covid /Pneumonia. PMH includes DM, HTN, hyperlipidemia, polymyalgia rheumatica.   Clinical Impression   PTA, pt independent without use of an AD. Pt able to amublate @ 20 ft on 15L with SpO2 desat to 75 with increased WOB. Takes several minutes to return to high 80s. Pt states he feel a "little dizzy " today. BP sitting 95/47; standing 90/55 - nsg made aware. Pt agreed to prone at end of session.  Will follow acutely to facilitate safe DC home.     Follow Up Recommendations  Supervision - Intermittent;No OT follow up    Equipment Recommendations  None recommended by OT    Recommendations for Other Services       Precautions / Restrictions Precautions Precautions: Fall Precaution Comments: monitor O2 Sats      Mobility Bed Mobility Overal bed mobility: Independent                Transfers Overall transfer level: Needs assistance   Transfers: Sit to/from Stand Sit to Stand: Min guard         General transfer comment: low BP and min c/o feeling dizzy    Balance Overall balance assessment: Needs assistance   Sitting balance-Leahy Scale: Good       Standing balance-Leahy Scale: Good                             ADL either performed or assessed with clinical judgement   ADL Overall ADL's : Needs assistance/impaired     Grooming: Set up;Sitting   Upper Body Bathing: Set up;Sitting   Lower Body Bathing: Min guard;Sit to/from stand   Upper Body Dressing : Set up;Sitting   Lower Body Dressing: Min guard;Sit to/from stand   Toilet Transfer: Min guard;Ambulation   Toileting- Clothing Manipulation and Hygiene: Set up       Functional mobility during ADLs: Min guard General ADL Comments: limitted by dyspnea and SpO2 into the 70s     Vision          Perception     Praxis      Pertinent Vitals/Pain Pain Assessment: No/denies pain     Hand Dominance Right   Extremity/Trunk Assessment Upper Extremity Assessment Upper Extremity Assessment: Generalized weakness   Lower Extremity Assessment Lower Extremity Assessment: Defer to PT evaluation   Cervical / Trunk Assessment Cervical / Trunk Assessment: Normal   Communication Communication Communication: No difficulties   Cognition Arousal/Alertness: Awake/alert Behavior During Therapy: WFL for tasks assessed/performed Overall Cognitive Status: Within Functional Limits for tasks assessed                                     General Comments       Exercises Other Exercises Other Exercises: pursed lip breathing Other Exercises: encouraged IS and Flutter   Shoulder Instructions      Home Living Family/patient expects to be discharged to:: Private residence Living Arrangements: Spouse/significant other Available Help at Discharge: Family Type of Home: House Home Access: Stairs to enter Technical brewer of Steps: 5 Entrance Stairs-Rails: Right Home Layout: One level     Bathroom Shower/Tub: Occupational psychologist: Handicapped height Bathroom Accessibility: Yes How Accessible: Accessible via walker Home  Equipment: Shower seat;Grab bars - toilet   Additional Comments: He was independent in all ADLs including driving. No AD for ambulation      Prior Functioning/Environment Level of Independence: Independent                 OT Problem List: Decreased activity tolerance;Decreased strength;Decreased knowledge of use of DME or AE;Cardiopulmonary status limiting activity      OT Treatment/Interventions: Self-care/ADL training;Therapeutic exercise;Neuromuscular education;Energy conservation;DME and/or AE instruction;Therapeutic activities;Patient/family education    OT Goals(Current goals can be found in the care plan section)  Acute Rehab OT Goals Patient Stated Goal: Just want to be able to get home with the family OT Goal Formulation: With patient Time For Goal Achievement: 04/26/19 Potential to Achieve Goals: Good  OT Frequency: Min 3X/week   Barriers to D/C:            Co-evaluation              AM-PAC OT "6 Clicks" Daily Activity     Outcome Measure Help from another person eating meals?: None Help from another person taking care of personal grooming?: A Little Help from another person toileting, which includes using toliet, bedpan, or urinal?: A Little Help from another person bathing (including washing, rinsing, drying)?: A Little Help from another person to put on and taking off regular upper body clothing?: A Little Help from another person to put on and taking off regular lower body clothing?: A Little 6 Click Score: 19   End of Session Nurse Communication: Mobility status;Other (comment)(SpO2 and BP)  Activity Tolerance: Patient tolerated treatment well Patient left: in bed;with call bell/phone within reach  OT Visit Diagnosis: Unsteadiness on feet (R26.81);Muscle weakness (generalized) (M62.81)                Time: [id number] OT Time Calculation (min): 41 min Charges:  OT General Charges $OT Visit: 1 Visit OT Evaluation $OT Eval Moderate Complexity: 1 Mod OT Treatments $Self Care/Home Management : 23-37 mins  Maurie Boettcher, OT/L   Acute OT Clinical Specialist Acute Rehabilitation Services Pager [phone] Office [phone]   [facility] 04/12/2019, 5:03 PM

## 2019-04-12 NOTE — Plan of Care (Signed)
Pt tolerated 5L O2 well. Lowest O2 saturation was 88-87%. Denies having shortness of breath during that time. Proning encouraged. Denies having needs now. Report to be given to day shift nurse Problem: Education: Goal: Knowledge of risk factors and measures for prevention of condition will improve Outcome: Progressing   Problem: Coping: Goal: Psychosocial and spiritual needs will be supported Outcome: Progressing   Problem: Respiratory: Goal: Will maintain a patent airway Outcome: Progressing Goal: Complications related to the disease process, condition or treatment will be avoided or minimized Outcome: Progressing

## 2019-04-13 LAB — COMPREHENSIVE METABOLIC PANEL
ALT: 20 U/L (ref 0–44)
AST: 26 U/L (ref 15–41)
Albumin: 3 g/dL — ABNORMAL LOW (ref 3.5–5.0)
Alkaline Phosphatase: 90 U/L (ref 38–126)
Anion gap: 11 (ref 5–15)
BUN: 34 mg/dL — ABNORMAL HIGH (ref 8–23)
CO2: 23 mmol/L (ref 22–32)
Calcium: 8.2 mg/dL — ABNORMAL LOW (ref 8.9–10.3)
Chloride: 98 mmol/L (ref 98–111)
Creatinine, Ser: 0.79 mg/dL (ref 0.61–1.24)
GFR calc Af Amer: >60 mL/min (ref >60)
GFR calc non Af Amer: >60 mL/min (ref >60)
Glucose, Bld: 162 mg/dL — ABNORMAL HIGH (ref 70–99)
Potassium: 4.4 mmol/L (ref 3.5–5.1)
Sodium: 132 mmol/L — ABNORMAL LOW (ref 135–145)
Total Bilirubin: 1 mg/dL (ref 0.3–1.2)
Total Protein: 5.5 g/dL — ABNORMAL LOW (ref 6.5–8.1)

## 2019-04-13 LAB — CBC WITH DIFFERENTIAL/PLATELET
Abs Immature Granulocytes: 0.45 10*3/uL — ABNORMAL HIGH (ref 0.00–0.07)
Basophils Absolute: 0.1 10*3/uL (ref 0.0–0.1)
Basophils Relative: 1 %
Eosinophils Absolute: 0.1 10*3/uL (ref 0.0–0.5)
Eosinophils Relative: 0 %
HCT: 42.7 % (ref 39.0–52.0)
Hemoglobin: 14.4 g/dL (ref 13.0–17.0)
Immature Granulocytes: 3 %
Lymphocytes Relative: 3 %
Lymphs Abs: 0.4 10*3/uL — ABNORMAL LOW (ref 0.7–4.0)
MCH: 30.3 pg (ref 26.0–34.0)
MCHC: 33.7 g/dL (ref 30.0–36.0)
MCV: 89.7 fL (ref 80.0–100.0)
Monocytes Absolute: 0.3 10*3/uL (ref 0.1–1.0)
Monocytes Relative: 2 %
Neutro Abs: 13.1 10*3/uL — ABNORMAL HIGH (ref 1.7–7.7)
Neutrophils Relative %: 91 %
Platelets: 274 10*3/uL (ref 150–400)
RBC: 4.76 MIL/uL (ref 4.22–5.81)
RDW: 13.9 % (ref 11.5–15.5)
WBC: 14.4 10*3/uL — ABNORMAL HIGH (ref 4.0–10.5)
nRBC: 0 % (ref 0.0–0.2)

## 2019-04-13 LAB — C-REACTIVE PROTEIN: CRP: 0.9 mg/dL (ref <1.0)

## 2019-04-13 LAB — GLUCOSE, CAPILLARY
Glucose-Capillary: 192 mg/dL — ABNORMAL HIGH (ref 70–99)
Glucose-Capillary: 125 mg/dL — ABNORMAL HIGH (ref 70–99)
Glucose-Capillary: 197 mg/dL — ABNORMAL HIGH (ref 70–99)
Glucose-Capillary: 211 mg/dL — ABNORMAL HIGH (ref 70–99)

## 2019-04-13 LAB — D-DIMER, QUANTITATIVE: D-Dimer, Quant: 18.56 ug/mL-FEU — ABNORMAL HIGH (ref 0.00–0.50)

## 2019-04-13 MED ORDER — SODIUM CHLORIDE 0.9% FLUSH
10.0000 mL | Freq: Two times a day (BID) | INTRAVENOUS | Status: DC
  Administered 2019-04-13: 40 mL
  Administered 2019-04-14: 10 mL
  Administered 2019-04-14: 40 mL
  Administered 2019-04-15 – 2019-04-20 (×10): 10 mL

## 2019-04-13 MED ORDER — SODIUM CHLORIDE 0.9% FLUSH
10.0000 mL | INTRAVENOUS | Status: DC | PRN
  Administered 2019-04-13: 40 mL
  Administered 2019-04-27 – 2019-04-28 (×2): 10 mL

## 2019-04-13 NOTE — Progress Notes (Addendum)
PROGRESS NOTE                                                                                                                                                                                                             Patient Demographics:    Dustin Diaz, is a 74 y.o. male, DOB - December 16, 1945, [id number]  Outpatient Primary MD for the patient is Lavone Orn, MD   Admit date - 04/05/2019   LOS - 8  Chief Complaint  Patient presents with  . Covid positive  . low O2       Brief Narrative: Patient is a 74 y.o. male with PMHx of DM-2, HTN, PMR, HLD,  who presented to the hospital on 1/14 with shortness of breath-was found to have acute hypoxic respiratory failure secondary to COVID-19 pneumonia.  Hospital course complicated by slow improvement in hypoxia and lower extremity DVT.  See below for further details.   Subjective:    Dustin Diaz lying comfortably in bed-seems frustrated that he still is requiring significant amount of oxygen with minimal movement.  At rest he is now down to just 4 L of oxygen.   Assessment  & Plan :   Acute Hypoxic Resp Failure due to Covid 19 Viral pneumonia: Overall trend is towards improvement-Per nursing staff-requires 10-15 L of oxygen with minimal movement.  At rest he is now down to 4 L.  Continue tapering steroids-CRP is now normalized.  Has finished a course of remdesivir.  Hold Lasix today-as he was slightly dizzy when he stood up yesterday-recheck BNP and procalcitonin with a.m. labs tomorrow.  Fever: afebrile  O2 requirements:  SpO2: 93 % O2 Flow Rate (L/min): 4 L/min FiO2 (%): 100 %   COVID-19 Labs: Recent Labs    04/11/19 0345 04/12/19 0136 04/13/19 0053  DDIMER >20.00* 17.57* 18.56*  FERRITIN 485* 421*  --   CRP 2.7* 1.4* 0.9    No results found for: BNP  No results for input(s): PROCALCITON in the last 168 hours.  No results found for: SARSCOV2NAA    COVID-19 Medications: Steroids: 1/14>> Remdesivir: 1/14>> 1/18 Actemra: 1/15 x 1, 1/16 x 1  Prone/Incentive Spirometry: encouraged  incentive spirometry use 3-4/hour.  DVT Prophylaxis  : Eliquis  Left lower extremity DVT: Continue Eliquis-CTA chest on 1/18 without PE.  DM-2: CBGs better controlled-continue Lantus 12 units  nightly, 4 units of NovoLog with meals and SSI.  Follow and optimize.   CBG (last 3)  Recent Labs    04/12/19 1059 04/12/19 1620 04/13/19 0806  GLUCAP 208* 192* 125*    HTN: BP controlled-continue Avapro and Norvasc  Dyslipidemia: Continue statin  History of PMR: Resume low-dose prednisone on discharge.  Right hilar lymphadenopathy: Likely reactive-repeat imaging in 3 to 6 months as outpatient.  Nutrition Problem: Nutrition Problem: Increased nutrient needs Etiology: catabolic [id number] PNA) Signs/Symptoms: estimated needs Interventions: Ensure Enlive (each supplement provides 350kcal and 20 grams of protein), Magic cup, Hormel Shake   Consults  :  None  Procedures  :  Non  ABG: No results found for: PHART, PCO2ART, PO2ART, HCO3, TCO2, ACIDBASEDEF, O2SAT  Vent Settings: N/A   Condition - Extremely Guarded  Family Communication  :  Spouse updated over the phone 1/22  Code Status :  Full Code  Diet :  Diet Order            Diet heart healthy/carb modified Room service appropriate? Yes; Fluid consistency: Thin  Diet effective now               Disposition Plan  :  Remain hospitalized-Home when hypoxia improves.  Barriers to discharge: Hypoxia requiring O2 supplementation  Antimicorbials  :    Anti-infectives (From admission, onward)   Start     Dose/Rate Route Frequency Ordered Stop   04/06/19 1000  remdesivir 100 mg in sodium chloride 0.9 % 100 mL IVPB  Status:  Discontinued     100 mg 200 mL/hr over 30 Minutes Intravenous Daily 04/05/19 1919 04/05/19 1919   04/06/19 1000  remdesivir 100 mg in sodium chloride 0.9 %  100 mL IVPB     100 mg 200 mL/hr over 30 Minutes Intravenous Daily 04/05/19 1919 04/09/19 0948   04/06/19 0400  azithromycin (ZITHROMAX) 500 mg in sodium chloride 0.9 % 250 mL IVPB  Status:  Discontinued     500 mg 250 mL/hr over 60 Minutes Intravenous Every 24 hours 04/06/19 0242 04/06/19 0725   04/06/19 0300  cefTRIAXone (ROCEPHIN) 1 g in sodium chloride 0.9 % 100 mL IVPB  Status:  Discontinued     1 g 200 mL/hr over 30 Minutes Intravenous Every 24 hours 04/06/19 0242 04/06/19 0725   04/05/19 2100  remdesivir 200 mg in sodium chloride 0.9% 250 mL IVPB     200 mg 580 mL/hr over 30 Minutes Intravenous Once 04/05/19 1919 04/05/19 2200   04/05/19 1918  remdesivir 200 mg in sodium chloride 0.9% 250 mL IVPB  Status:  Discontinued     200 mg 580 mL/hr over 30 Minutes Intravenous Once 04/05/19 1919 04/05/19 1919      Inpatient Medications  Scheduled Meds: . irbesartan  150 mg Oral Daily   And  . amLODipine  5 mg Oral Daily  . apixaban  10 mg Oral BID   Followed by  . [START ON 04/16/2019] apixaban  5 mg Oral BID  . vitamin C  500 mg Oral Daily  . atorvastatin  10 mg Oral Daily  . dexamethasone (DECADRON) injection  4 mg Intravenous Q24H  . insulin aspart  0-20 Units Subcutaneous TID WC  . insulin aspart  0-5 Units Subcutaneous QHS  . insulin aspart  4 Units Subcutaneous TID WC  . insulin glargine  12 Units Subcutaneous QHS  . Ipratropium-Albuterol  1 puff Inhalation Q6H  . latanoprost  1 drop Both Eyes QHS  . pantoprazole  40 mg Oral Daily  . pioglitazone  45 mg Oral Daily  . zinc sulfate  220 mg Oral Daily   Continuous Infusions: PRN Meds:.acetaminophen, chlorpheniramine-HYDROcodone, fluticasone, guaiFENesin-dextromethorphan, lip balm, ondansetron **OR** ondansetron (ZOFRAN) IV   Time Spent in minutes  25  See all Orders from today for further details   Oren Binet M.D on 04/13/2019 at 10:42 AM  To page go to [email or web address] - use universal password  [facility]  -  Office  [phone]    Objective:   Vitals:   04/12/19 1615 04/12/19 1929 04/13/19 0400 04/13/19 1016  BP: 130/69 117/62    Pulse: 77     Resp: 20     Temp: 98 F (36.7 C) 97.6 F (36.4 C) 98.4 F (36.9 C)   TempSrc: Oral Oral Oral   SpO2: 96%   93%  Weight:      Height:        Wt Readings from Last 3 Encounters:  04/06/19 78 kg  07/24/12 93 kg     Intake/Output Summary (Last 24 hours) at 04/13/2019 1042 Last data filed at 04/13/2019 0455 Gross per 24 hour  Intake --  Output 1770 ml  Net -1770 ml     Physical Exam Gen Exam:Alert awake-not in any distress HEENT:atraumatic, normocephalic Chest: B/L clear to auscultation anteriorly CVS:S1S2 regular Abdomen:soft non tender, non distended Extremities:no edema Neurology: Non focal Skin: no rash   Data Review:    CBC Recent Labs  Lab 04/09/19 0100 04/10/19 0520 04/11/19 0345 04/12/19 0136 04/13/19 0053  WBC 9.3 12.2* 13.9* 12.8* 14.4*  HGB 13.7 14.4 15.3 14.3 14.4  HCT 41.7 42.7 46.3 42.5 42.7  PLT 397 344 340 301 274  MCV 89.9 90.1 90.3 89.7 89.7  MCH 29.5 30.4 29.8 30.2 30.3  MCHC 32.9 33.7 33.0 33.6 33.7  RDW 14.0 13.9 13.8 13.8 13.9  LYMPHSABS 0.5* 0.7 0.6* 0.4* 0.4*  MONOABS 0.4 0.5 0.4 0.5 0.3  EOSABS 0.0 0.0 0.0 0.0 0.1  BASOSABS 0.1 0.1 0.0 0.1 0.1    Chemistries  Recent Labs  Lab 04/09/19 0100 04/10/19 0520 04/11/19 0345 04/12/19 0136 04/13/19 0053  NA 136 136 134* 135 132*  K 4.7 4.6 4.7 4.3 4.4  CL 104 102 100 101 98  CO2 22 24 24 24 23   GLUCOSE 132* 149* 171* 205* 162*  BUN 31* 30* 32* 33* 34*  CREATININE 0.75 0.76 0.69 0.86 0.79  CALCIUM 8.3* 8.5* 8.6* 8.3* 8.2*  AST 33 24 23 21 26   ALT 26 23 21 20 20   ALKPHOS 70 85 88 89 90  BILITOT 1.0 0.9 1.3* 0.8 1.0   ------------------------------------------------------------------------------------------------------------------ No results for input(s): CHOL, HDL, LDLCALC, Dustin, CHOLHDL, LDLDIRECT in the last 72 hours.  Lab  Results  Component Value Date   HGBA1C 7.1 (H) 04/06/2019   ------------------------------------------------------------------------------------------------------------------ No results for input(s): TSH, T4TOTAL, T3FREE, THYROIDAB in the last 72 hours.  Invalid input(s): FREET3 ------------------------------------------------------------------------------------------------------------------ Recent Labs    04/11/19 0345 04/12/19 0136  FERRITIN 485* 421*    Coagulation profile No results for input(s): INR, PROTIME in the last 168 hours.  Recent Labs    04/12/19 0136 04/13/19 0053  DDIMER 17.57* 18.56*    Cardiac Enzymes No results for input(s): CKMB, TROPONINI, MYOGLOBIN in the last 168 hours.  Invalid input(s): CK ------------------------------------------------------------------------------------------------------------------ No results found for: BNP  Micro Results Recent Results (from the past 240 hour(s))  Blood Culture (routine x 2)     Status: None   Collection Time:  04/05/19  4:08 PM   Specimen: BLOOD LEFT HAND  Result Value Ref Range Status   Specimen Description   Final    BLOOD LEFT HAND Performed at [facility] [address], [place], [place] [address]    Special Requests   Final    BOTTLES DRAWN AEROBIC AND ANAEROBIC Blood Culture adequate volume Performed at [facility] [address] [place], [place] [address]    Culture   Final    NO GROWTH 5 DAYS Performed at [facility], [place] [address], [place], [place] [address]    Report Status 04/10/2019 FINAL  Final  Blood Culture (routine x 2)     Status: None   Collection Time: 04/05/19  4:19 PM   Specimen: BLOOD LEFT HAND  Result Value Ref Range Status   Specimen Description   Final    BLOOD LEFT HAND Performed at [facility] [address] [place], [place] [address]    Special Requests   Final    BOTTLES DRAWN AEROBIC ONLY  Blood Culture adequate volume Performed at [facility] [address] [place], [place] [address]    Culture   Final    NO GROWTH 5 DAYS Performed at [facility], [place] [address], [place], [place] [address]    Report Status 04/10/2019 FINAL  Final    Radiology Reports CT ANGIO CHEST PE W OR WO CONTRAST  Result Date: 04/09/2019 CLINICAL DATA:  COVID pneumonia, hypoxia, shortness of breath and elevated D-dimer. EXAM: CT ANGIOGRAPHY CHEST WITH CONTRAST TECHNIQUE: Multidetector CT imaging of the chest was performed using the standard protocol during bolus administration of intravenous contrast. Multiplanar CT image reconstructions and MIPs were obtained to evaluate the vascular anatomy. CONTRAST:  54mL OMNIPAQUE IOHEXOL 350 MG/ML SOLN COMPARISON:  Chest x-ray on 04/05/2019 FINDINGS: Cardiovascular: The pulmonary arteries are well opacified. There is no evidence pulmonary embolism. Central pulmonary arteries are normal in caliber. The heart size is normal. The thoracic aorta is normal in caliber. Trace amount of pericardial fluid. Calcified plaque is noted of the coronary arteries primarily in the distribution of the LAD. Mediastinum/Nodes: Mildly prominent right hilar Diaz tissue measuring up to 1.5 cm. No evidence of enlarged mediastinal or axillary lymph nodes. Lungs/Pleura: Severe airspace disease is seen throughout all lobes of both lungs in a ground-glass pattern. There is an associated very small right pleural effusion. No pneumothorax. Upper Abdomen: No acute abnormality. Musculoskeletal: No chest wall abnormality. No acute or significant osseous findings. Review of the MIP images confirms the above findings. IMPRESSION: 1. No evidence of pulmonary embolism. 2. Severe airspace disease throughout all lobes of both lungs in a ground-glass pattern. Findings are consistent with severe bilateral COVID-19 pneumonia. 3. Mildly prominent right hilar Diaz tissue measuring up to  1.5 cm in short axis. This is likely reactive. 4. Coronary artery disease with calcified plaque in the distribution of the LAD. Electronically Signed   By: Aletta Edouard M.D.   On: 04/09/2019 08:58   DG Chest Port 1 View  Result Date: 04/05/2019 CLINICAL DATA:  COVID positive low oxygen level EXAM: PORTABLE CHEST 1 VIEW COMPARISON:  None. FINDINGS: Bilateral ground-glass opacities and consolidations. No pleural effusion. Normal heart size. Aortic atherosclerosis. No pneumothorax. IMPRESSION: Bilateral ground-glass opacities and consolidations consistent with bilateral pneumonia. Electronically Signed   By: Donavan Foil M.D.   On: 04/05/2019 16:47   VAS [place] LOWER EXTREMITY VENOUS (DVT)  Result Date: 04/09/2019  Lower Venous Study Indications: Covid positive with worsening positive d-Dimer and hypoxia.  Anticoagulation: Lovenox. Comparison Study: No priors. Performing Technologist: Oda Cogan RDMS, RVT  Examination Guidelines: A complete evaluation includes B-mode imaging, spectral Doppler, color Doppler, and power Doppler as needed of all accessible portions of each vessel. Bilateral testing is considered an integral part of a complete examination. Limited examinations for reoccurring indications may be performed as noted.  +---------+---------------+---------+-----------+----------+--------------+ RIGHT    CompressibilityPhasicitySpontaneityPropertiesThrombus Aging +---------+---------------+---------+-----------+----------+--------------+ CFV      Full           Yes      Yes                                 +---------+---------------+---------+-----------+----------+--------------+ SFJ      Full                                                        +---------+---------------+---------+-----------+----------+--------------+ FV Prox  Full                                                        +---------+---------------+---------+-----------+----------+--------------+ FV Mid    Full                                                        +---------+---------------+---------+-----------+----------+--------------+ FV DistalFull                                                        +---------+---------------+---------+-----------+----------+--------------+ PFV      Full                                                        +---------+---------------+---------+-----------+----------+--------------+ POP      Full           Yes      Yes                                 +---------+---------------+---------+-----------+----------+--------------+ PTV      Full                                                        +---------+---------------+---------+-----------+----------+--------------+ PERO     Full                                                        +---------+---------------+---------+-----------+----------+--------------+   +---------+---------------+---------+-----------+----------+--------------+  LEFT     CompressibilityPhasicitySpontaneityPropertiesThrombus Aging +---------+---------------+---------+-----------+----------+--------------+ CFV      Full           Yes      Yes                                 +---------+---------------+---------+-----------+----------+--------------+ SFJ      Full                                                        +---------+---------------+---------+-----------+----------+--------------+ FV Prox  Full                                                        +---------+---------------+---------+-----------+----------+--------------+ FV Mid   Full                                                        +---------+---------------+---------+-----------+----------+--------------+ FV DistalFull                                                        +---------+---------------+---------+-----------+----------+--------------+ PFV      Full                                                         +---------+---------------+---------+-----------+----------+--------------+ POP      Full           Yes      Yes                                 +---------+---------------+---------+-----------+----------+--------------+ PTV      Full                                                        +---------+---------------+---------+-----------+----------+--------------+ PERO     Partial                                      Acute          +---------+---------------+---------+-----------+----------+--------------+ Soleal   Full                                         Acute          +---------+---------------+---------+-----------+----------+--------------+  Summary: Right: There is no evidence of deep vein thrombosis in the lower extremity. No cystic structure found in the popliteal fossa. Left: Findings consistent with acute deep vein thrombosis involving the left peroneal veins, and left soleal veins. Short segment of localized thrombus in the peroneal and soleal veins in the mid segment.  *See table(s) above for measurements and observations. Electronically signed by Ruta Hinds MD on 04/09/2019 at 2:56:25 PM.    Final

## 2019-04-14 LAB — PROCALCITONIN: Procalcitonin: <0.1 ng/mL

## 2019-04-14 LAB — CBC WITH DIFFERENTIAL/PLATELET
Abs Immature Granulocytes: 0.39 10*3/uL — ABNORMAL HIGH (ref 0.00–0.07)
Basophils Absolute: 0 10*3/uL (ref 0.0–0.1)
Basophils Relative: 0 %
Eosinophils Absolute: 0 10*3/uL (ref 0.0–0.5)
Eosinophils Relative: 0 %
HCT: 42.2 % (ref 39.0–52.0)
Hemoglobin: 14.1 g/dL (ref 13.0–17.0)
Immature Granulocytes: 3 %
Lymphocytes Relative: 4 %
Lymphs Abs: 0.5 10*3/uL — ABNORMAL LOW (ref 0.7–4.0)
MCH: 30 pg (ref 26.0–34.0)
MCHC: 33.4 g/dL (ref 30.0–36.0)
MCV: 89.8 fL (ref 80.0–100.0)
Monocytes Absolute: 0.4 10*3/uL (ref 0.1–1.0)
Monocytes Relative: 3 %
Neutro Abs: 11.8 10*3/uL — ABNORMAL HIGH (ref 1.7–7.7)
Neutrophils Relative %: 90 %
Platelets: 261 10*3/uL (ref 150–400)
RBC: 4.7 MIL/uL (ref 4.22–5.81)
RDW: 13.9 % (ref 11.5–15.5)
WBC: 13.2 10*3/uL — ABNORMAL HIGH (ref 4.0–10.5)
nRBC: 0 % (ref 0.0–0.2)

## 2019-04-14 LAB — GLUCOSE, CAPILLARY
Glucose-Capillary: 121 mg/dL — ABNORMAL HIGH (ref 70–99)
Glucose-Capillary: 102 mg/dL — ABNORMAL HIGH (ref 70–99)
Glucose-Capillary: 179 mg/dL — ABNORMAL HIGH (ref 70–99)
Glucose-Capillary: 194 mg/dL — ABNORMAL HIGH (ref 70–99)
Glucose-Capillary: 190 mg/dL — ABNORMAL HIGH (ref 70–99)

## 2019-04-14 LAB — COMPREHENSIVE METABOLIC PANEL
ALT: 19 U/L (ref 0–44)
AST: 19 U/L (ref 15–41)
Albumin: 3 g/dL — ABNORMAL LOW (ref 3.5–5.0)
Alkaline Phosphatase: 95 U/L (ref 38–126)
Anion gap: 9 (ref 5–15)
BUN: 40 mg/dL — ABNORMAL HIGH (ref 8–23)
CO2: 26 mmol/L (ref 22–32)
Calcium: 8.3 mg/dL — ABNORMAL LOW (ref 8.9–10.3)
Chloride: 96 mmol/L — ABNORMAL LOW (ref 98–111)
Creatinine, Ser: 0.89 mg/dL (ref 0.61–1.24)
GFR calc Af Amer: >60 mL/min (ref >60)
GFR calc non Af Amer: >60 mL/min (ref >60)
Glucose, Bld: 145 mg/dL — ABNORMAL HIGH (ref 70–99)
Potassium: 4.4 mmol/L (ref 3.5–5.1)
Sodium: 131 mmol/L — ABNORMAL LOW (ref 135–145)
Total Bilirubin: 1.1 mg/dL (ref 0.3–1.2)
Total Protein: 5.5 g/dL — ABNORMAL LOW (ref 6.5–8.1)

## 2019-04-14 LAB — D-DIMER, QUANTITATIVE: D-Dimer, Quant: 18.99 ug/mL-FEU — ABNORMAL HIGH (ref 0.00–0.50)

## 2019-04-14 LAB — C-REACTIVE PROTEIN: CRP: 0.6 mg/dL (ref <1.0)

## 2019-04-14 LAB — BRAIN NATRIURETIC PEPTIDE: B Natriuretic Peptide: 48.6 pg/mL (ref 0.0–100.0)

## 2019-04-14 NOTE — Progress Notes (Signed)
PROGRESS NOTE                                                                                                                                                                                                             Patient Demographics:    Dustin Diaz, is a 74 y.o. male, DOB - [birth year], [id number]  Outpatient Primary MD for the patient is Lavone Orn, MD   Admit date - 04/05/2019   LOS - 9  Chief Complaint  Patient presents with  . Covid positive  . low O2       Brief Narrative: Patient is a 74 y.o. male with PMHx of DM-2, HTN, PMR, HLD,  who presented to the hospital on 1/14 with shortness of breath-was found to have acute hypoxic respiratory failure secondary to COVID-19 pneumonia.  Hospital course complicated by slow improvement in hypoxia and lower extremity DVT.  See below for further details.   Subjective:    Dustin Diaz was lying comfortably in bed-he is frustrated about still being in the hospital.  He was titrated down to just 2 L of oxygen.   Assessment  & Plan :   Acute Hypoxic Resp Failure due to Covid 19 Viral pneumonia: Slowly improving-down to just 2 L of oxygen at rest-but still requires 15 L of oxygen with minimal ambulation.  Continue supportive care-needs to be mobilized.  BNP and procalcitonin within normal limits.  Fever: afebrile  O2 requirements:  SpO2: 93 % O2 Flow Rate (L/min): 2 L/min(desats to low 80's w/ activity) FiO2 (%): 100 %   COVID-19 Labs: Recent Labs    04/12/19 0136 04/13/19 0053 04/14/19 0440  DDIMER 17.57* 18.56* 18.99*  FERRITIN 421*  --   --   CRP 1.4* 0.9 0.6       Component Value Date/Time   BNP 48.6 04/14/2019 0440    Recent Labs  Lab 04/14/19 0440  PROCALCITON <0.10    No results found for: SARSCOV2NAA   COVID-19 Medications: Steroids: 1/14>> Remdesivir: 1/14>> 1/18 Actemra: 1/15 x 1, 1/16 x 1  Prone/Incentive Spirometry:  encouraged  incentive spirometry use 3-4/hour.  DVT Prophylaxis  : Eliquis  Left lower extremity DVT: Continue Eliquis-CTA chest on 1/18 without PE.  DM-2: CBGs better controlled-continue Lantus 12 units nightly, 4 units of NovoLog with meals and SSI.  Follow and optimize.  CBG (last 3)  Recent Labs    04/14/19 0720 04/14/19 0834 04/14/19 1206  GLUCAP 121* 102* 179*    HTN: BP controlled-continue Avapro and Norvasc  Dyslipidemia: Continue statin  History of PMR: Resume low-dose prednisone on discharge.  Right hilar lymphadenopathy: Likely reactive-repeat imaging in 3 to 6 months as outpatient.  Nutrition Problem: Nutrition Problem: Increased nutrient needs Etiology: catabolic [id number] PNA) Signs/Symptoms: estimated needs Interventions: Ensure Enlive (each supplement provides 350kcal and 20 grams of protein), Magic cup, Hormel Shake   Consults  :  None  Procedures  :  Non  ABG: No results found for: PHART, PCO2ART, PO2ART, HCO3, TCO2, ACIDBASEDEF, O2SAT  Vent Settings: N/A   Condition - Extremely Guarded  Family Communication  :  Spouse updated over the phone 1/23  Code Status :  Full Code  Diet :  Diet Order            Diet heart healthy/carb modified Room service appropriate? Yes; Fluid consistency: Thin  Diet effective now               Disposition Plan  :  Remain hospitalized-Home when hypoxia improves.  Barriers to discharge: Hypoxia requiring O2 supplementation  Antimicorbials  :    Anti-infectives (From admission, onward)   Start     Dose/Rate Route Frequency Ordered Stop   04/06/19 1000  remdesivir 100 mg in sodium chloride 0.9 % 100 mL IVPB  Status:  Discontinued     100 mg 200 mL/hr over 30 Minutes Intravenous Daily 04/05/19 1919 04/05/19 1919   04/06/19 1000  remdesivir 100 mg in sodium chloride 0.9 % 100 mL IVPB     100 mg 200 mL/hr over 30 Minutes Intravenous Daily 04/05/19 1919 04/09/19 0948   04/06/19 0400  azithromycin  (ZITHROMAX) 500 mg in sodium chloride 0.9 % 250 mL IVPB  Status:  Discontinued     500 mg 250 mL/hr over 60 Minutes Intravenous Every 24 hours 04/06/19 0242 04/06/19 0725   04/06/19 0300  cefTRIAXone (ROCEPHIN) 1 g in sodium chloride 0.9 % 100 mL IVPB  Status:  Discontinued     1 g 200 mL/hr over 30 Minutes Intravenous Every 24 hours 04/06/19 0242 04/06/19 0725   04/05/19 2100  remdesivir 200 mg in sodium chloride 0.9% 250 mL IVPB     200 mg 580 mL/hr over 30 Minutes Intravenous Once 04/05/19 1919 04/05/19 2200   04/05/19 1918  remdesivir 200 mg in sodium chloride 0.9% 250 mL IVPB  Status:  Discontinued     200 mg 580 mL/hr over 30 Minutes Intravenous Once 04/05/19 1919 04/05/19 1919      Inpatient Medications  Scheduled Meds: . irbesartan  150 mg Oral Daily   And  . amLODipine  5 mg Oral Daily  . apixaban  10 mg Oral BID   Followed by  . [START ON 04/16/2019] apixaban  5 mg Oral BID  . vitamin C  500 mg Oral Daily  . atorvastatin  10 mg Oral Daily  . dexamethasone (DECADRON) injection  4 mg Intravenous Q24H  . insulin aspart  0-20 Units Subcutaneous TID WC  . insulin aspart  0-5 Units Subcutaneous QHS  . insulin aspart  4 Units Subcutaneous TID WC  . insulin glargine  12 Units Subcutaneous QHS  . Ipratropium-Albuterol  1 puff Inhalation Q6H  . latanoprost  1 drop Both Eyes QHS  . pantoprazole  40 mg Oral Daily  . pioglitazone  45 mg Oral Daily  . sodium  chloride flush  10-40 mL Intracatheter Q12H  . zinc sulfate  220 mg Oral Daily   Continuous Infusions: PRN Meds:.acetaminophen, chlorpheniramine-HYDROcodone, fluticasone, guaiFENesin-dextromethorphan, lip balm, ondansetron **OR** ondansetron (ZOFRAN) IV, sodium chloride flush   Time Spent in minutes  25  See all Orders from today for further details   Oren Binet M.D on 04/14/2019 at 3:23 PM  To page go to [email or web address] - use universal password  [facility] -  Office  [phone]    Objective:    Vitals:   04/13/19 1840 04/13/19 2131 04/14/19 0910 04/14/19 1200  BP:   (!) 108/54 109/62  Pulse:   74 75  Resp:   18 18  Temp:  97.9 F (36.6 C) 97.6 F (36.4 C)   TempSrc:  Oral Oral   SpO2: 93%  98% 93%  Weight:      Height:        Wt Readings from Last 3 Encounters:  04/06/19 78 kg  07/24/12 93 kg     Intake/Output Summary (Last 24 hours) at 04/14/2019 1523 Last data filed at 04/14/2019 [id number] Gross per 24 hour  Intake 620 ml  Output 850 ml  Net -230 ml    Physical Exam Gen Exam:Alert awake-not in any distress HEENT:atraumatic, normocephalic Chest: B/L clear to auscultation anteriorly CVS:S1S2 regular Abdomen:soft non tender, non distended Extremities:no edema Neurology: Non focal Skin: no rash   Data Review:    CBC Recent Labs  Lab 04/10/19 0520 04/11/19 0345 04/12/19 0136 04/13/19 0053 04/14/19 0440  WBC 12.2* 13.9* 12.8* 14.4* 13.2*  HGB 14.4 15.3 14.3 14.4 14.1  HCT 42.7 46.3 42.5 42.7 42.2  PLT 344 340 301 274 261  MCV 90.1 90.3 89.7 89.7 89.8  MCH 30.4 29.8 30.2 30.3 30.0  MCHC 33.7 33.0 33.6 33.7 33.4  RDW 13.9 13.8 13.8 13.9 13.9  LYMPHSABS 0.7 0.6* 0.4* 0.4* 0.5*  MONOABS 0.5 0.4 0.5 0.3 0.4  EOSABS 0.0 0.0 0.0 0.1 0.0  BASOSABS 0.1 0.0 0.1 0.1 0.0    Chemistries  Recent Labs  Lab 04/10/19 0520 04/11/19 0345 04/12/19 0136 04/13/19 0053 04/14/19 0440  NA 136 134* 135 132* 131*  K 4.6 4.7 4.3 4.4 4.4  CL 102 100 101 98 96*  CO2 24 24 24 23 26   GLUCOSE 149* 171* 205* 162* 145*  BUN 30* 32* 33* 34* 40*  CREATININE 0.76 0.69 0.86 0.79 0.89  CALCIUM 8.5* 8.6* 8.3* 8.2* 8.3*  AST 24 23 21 26 19   ALT 23 21 20 20 19   ALKPHOS 85 88 89 90 95  BILITOT 0.9 1.3* 0.8 1.0 1.1   ------------------------------------------------------------------------------------------------------------------ No results for input(s): CHOL, HDL, LDLCALC, TRIG, CHOLHDL, LDLDIRECT in the last 72 hours.  Lab Results  Component Value Date   HGBA1C 7.1  (H) 04/06/2019   ------------------------------------------------------------------------------------------------------------------ No results for input(s): TSH, T4TOTAL, T3FREE, THYROIDAB in the last 72 hours.  Invalid input(s): FREET3 ------------------------------------------------------------------------------------------------------------------ Recent Labs    04/12/19 0136  FERRITIN 421*    Coagulation profile No results for input(s): INR, PROTIME in the last 168 hours.  Recent Labs    04/13/19 0053 04/14/19 0440  DDIMER 18.56* 18.99*    Cardiac Enzymes No results for input(s): CKMB, TROPONINI, MYOGLOBIN in the last 168 hours.  Invalid input(s): CK ------------------------------------------------------------------------------------------------------------------    Component Value Date/Time   BNP 48.6 04/14/2019 0440    Micro Results Recent Results (from the past 240 hour(s))  Blood Culture (routine x 2)     Status: None  Collection Time: 04/05/19  4:08 PM   Specimen: BLOOD LEFT HAND  Result Value Ref Range Status   Specimen Description   Final    BLOOD LEFT HAND Performed at [facility] [address], [place], [place] [address]    Special Requests   Final    BOTTLES DRAWN AEROBIC AND ANAEROBIC Blood Culture adequate volume Performed at [facility] [address] [place], [place] [address]    Culture   Final    NO GROWTH 5 DAYS Performed at [facility], [place] [address] [place], [place] [address]    Report Status 04/10/2019 FINAL  Final  Blood Culture (routine x 2)     Status: None   Collection Time: 04/05/19  4:19 PM   Specimen: BLOOD LEFT HAND  Result Value Ref Range Status   Specimen Description   Final    BLOOD LEFT HAND Performed at [facility] [address] [place], [place] [address]    Special Requests   Final    BOTTLES DRAWN AEROBIC ONLY Blood Culture adequate  volume Performed at [facility] [address] [place], [place] [address]    Culture   Final    NO GROWTH 5 DAYS Performed at [facility], [place] [address] [place], [place] [address]    Report Status 04/10/2019 FINAL  Final    Radiology Reports CT ANGIO CHEST PE W OR WO CONTRAST  Result Date: 04/09/2019 CLINICAL DATA:  COVID pneumonia, hypoxia, shortness of breath and elevated D-dimer. EXAM: CT ANGIOGRAPHY CHEST WITH CONTRAST TECHNIQUE: Multidetector CT imaging of the chest was performed using the standard protocol during bolus administration of intravenous contrast. Multiplanar CT image reconstructions and MIPs were obtained to evaluate the vascular anatomy. CONTRAST:  20mL OMNIPAQUE IOHEXOL 350 MG/ML SOLN COMPARISON:  Chest x-ray on 04/05/2019 FINDINGS: Cardiovascular: The pulmonary arteries are well opacified. There is no evidence pulmonary embolism. Central pulmonary arteries are normal in caliber. The heart size is normal. The thoracic aorta is normal in caliber. Trace amount of pericardial fluid. Calcified plaque is noted of the coronary arteries primarily in the distribution of the LAD. Mediastinum/Nodes: Mildly prominent right hilar nodal tissue measuring up to 1.5 cm. No evidence of enlarged mediastinal or axillary lymph nodes. Lungs/Pleura: Severe airspace disease is seen throughout all lobes of both lungs in a ground-glass pattern. There is an associated very small right pleural effusion. No pneumothorax. Upper Abdomen: No acute abnormality. Musculoskeletal: No chest wall abnormality. No acute or significant osseous findings. Review of the MIP images confirms the above findings. IMPRESSION: 1. No evidence of pulmonary embolism. 2. Severe airspace disease throughout all lobes of both lungs in a ground-glass pattern. Findings are consistent with severe bilateral COVID-19 pneumonia. 3. Mildly prominent right hilar nodal tissue measuring up to 1.5 cm in short axis.  This is likely reactive. 4. Coronary artery disease with calcified plaque in the distribution of the LAD. Electronically Signed   By: Aletta Edouard M.D.   On: 04/09/2019 08:58   DG Chest Port 1 View  Result Date: 04/05/2019 CLINICAL DATA:  COVID positive low oxygen level EXAM: PORTABLE CHEST 1 VIEW COMPARISON:  None. FINDINGS: Bilateral ground-glass opacities and consolidations. No pleural effusion. Normal heart size. Aortic atherosclerosis. No pneumothorax. IMPRESSION: Bilateral ground-glass opacities and consolidations consistent with bilateral pneumonia. Electronically Signed   By: Donavan Foil M.D.   On: 04/05/2019 16:47   VAS [place] LOWER EXTREMITY VENOUS (DVT)  Result  Date: 04/09/2019  Lower Venous Study Indications: Covid positive with worsening positive d-Dimer and hypoxia.  Anticoagulation: Lovenox. Comparison Study: No priors. Performing Technologist: Oda Cogan RDMS, RVT  Examination Guidelines: A complete evaluation includes B-mode imaging, spectral Doppler, color Doppler, and power Doppler as needed of all accessible portions of each vessel. Bilateral testing is considered an integral part of a complete examination. Limited examinations for reoccurring indications may be performed as noted.  +---------+---------------+---------+-----------+----------+--------------+ RIGHT    CompressibilityPhasicitySpontaneityPropertiesThrombus Aging +---------+---------------+---------+-----------+----------+--------------+ CFV      Full           Yes      Yes                                 +---------+---------------+---------+-----------+----------+--------------+ SFJ      Full                                                        +---------+---------------+---------+-----------+----------+--------------+ FV Prox  Full                                                        +---------+---------------+---------+-----------+----------+--------------+ FV Mid   Full                                                         +---------+---------------+---------+-----------+----------+--------------+ FV DistalFull                                                        +---------+---------------+---------+-----------+----------+--------------+ PFV      Full                                                        +---------+---------------+---------+-----------+----------+--------------+ POP      Full           Yes      Yes                                 +---------+---------------+---------+-----------+----------+--------------+ PTV      Full                                                        +---------+---------------+---------+-----------+----------+--------------+ PERO     Full                                                        +---------+---------------+---------+-----------+----------+--------------+   +---------+---------------+---------+-----------+----------+--------------+  LEFT     CompressibilityPhasicitySpontaneityPropertiesThrombus Aging +---------+---------------+---------+-----------+----------+--------------+ CFV      Full           Yes      Yes                                 +---------+---------------+---------+-----------+----------+--------------+ SFJ      Full                                                        +---------+---------------+---------+-----------+----------+--------------+ FV Prox  Full                                                        +---------+---------------+---------+-----------+----------+--------------+ FV Mid   Full                                                        +---------+---------------+---------+-----------+----------+--------------+ FV DistalFull                                                        +---------+---------------+---------+-----------+----------+--------------+ PFV      Full                                                         +---------+---------------+---------+-----------+----------+--------------+ POP      Full           Yes      Yes                                 +---------+---------------+---------+-----------+----------+--------------+ PTV      Full                                                        +---------+---------------+---------+-----------+----------+--------------+ PERO     Partial                                      Acute          +---------+---------------+---------+-----------+----------+--------------+ Soleal   Full                                         Acute          +---------+---------------+---------+-----------+----------+--------------+  Summary: Right: There is no evidence of deep vein thrombosis in the lower extremity. No cystic structure found in the popliteal fossa. Left: Findings consistent with acute deep vein thrombosis involving the left peroneal veins, and left soleal veins. Short segment of localized thrombus in the peroneal and soleal veins in the mid segment.  *See table(s) above for measurements and observations. Electronically signed by Ruta Hinds MD on 04/09/2019 at 2:56:25 PM.    Final

## 2019-04-14 NOTE — Plan of Care (Signed)
  Problem: Education: Goal: Knowledge of risk factors and measures for prevention of condition will improve Outcome: Progressing   Problem: Coping: Goal: Psychosocial and spiritual needs will be supported Outcome: Progressing   Problem: Respiratory: Goal: Will maintain a patent airway Outcome: Progressing Goal: Complications related to the disease process, condition or treatment will be avoided or minimized Outcome: Progressing   

## 2019-04-14 NOTE — Progress Notes (Signed)
Physical Therapy Treatment Patient Details Name: Dustin Diaz MRN: [id number] DOB: 30-Mar-1945 Today's Date: 04/14/2019    History of Present Illness 74 year old male admitted with Covid /Pneumonia. PMH includes DM, HTN, hyperlipidemia, polymyalgia rheumatica.    PT Comments    Attempted amb in room but pt became very light headed after a few steps and we had to return to bed. Will continue to work toward incr activity tolerance.    Follow Up Recommendations  Home health PT;Supervision - Intermittent     Equipment Recommendations  Other (comment)(To be determined)    Recommendations for Other Services       Precautions / Restrictions Precautions Precautions: Fall;Other (comment) Precaution Comments: monitor O2 Sats and check orthostatics    Mobility  Bed Mobility Overal bed mobility: Independent                Transfers Overall transfer level: Needs assistance Equipment used: None Transfers: Sit to/from Stand Sit to Stand: Min guard         General transfer comment: Min guard for safety coming to stand and min assist to sit due to lightheadedness.   Ambulation/Gait Ambulation/Gait assistance: Min guard;Min assist;Mod assist Gait Distance (Feet): 6 Feet Assistive device: None;1 person hand held assist;Rolling walker (2 wheeled) Gait Pattern/deviations: Step-through pattern;Decreased step length - right;Decreased step length - left;Staggering left;Staggering right Gait velocity: decr Gait velocity interpretation: <1.31 ft/sec, indicative of household ambulator General Gait Details: Initially min guard for safety and lines but then pt staggering and required mod assist to correct. Stepped backwards to bed with walker to sit.    Stairs             Wheelchair Mobility    Modified Rankin (Stroke Patients Only)       Balance Overall balance assessment: Needs assistance Sitting-balance support: No upper extremity supported;Feet  supported Sitting balance-Leahy Scale: Good     Standing balance support: No upper extremity supported;Single extremity supported Standing balance-Leahy Scale: Poor Standing balance comment: assist when became lightheaded to regain static balance                            Cognition Arousal/Alertness: Awake/alert Behavior During Therapy: WFL for tasks assessed/performed Overall Cognitive Status: Within Functional Limits for tasks assessed                                        Exercises      General Comments General comments (skin integrity, edema, etc.): At rest on 4L of O2 with SpO2>90%. Incr to 6L to attempt to amb but pt became too lightheaded to take more than a few steps. SpO2 86% at that time.       Pertinent Vitals/Pain Pain Assessment: No/denies pain    Home Living                      Prior Function            PT Goals (current goals can now be found in the care plan section) Acute Rehab PT Goals Patient Stated Goal: Just want to be able to get home with the family Progress towards PT goals: Not progressing toward goals - comment    Frequency    Min 3X/week      PT Plan Discharge plan needs to be updated  Co-evaluation              AM-PAC PT "6 Clicks" Mobility   Outcome Measure  Help needed turning from your back to your side while in a flat bed without using bedrails?: None Help needed moving from lying on your back to sitting on the side of a flat bed without using bedrails?: None Help needed moving to and from a bed to a chair (including a wheelchair)?: A Little Help needed standing up from a chair using your arms (e.g., wheelchair or bedside chair)?: A Little Help needed to walk in hospital room?: A Lot Help needed climbing 3-5 steps with a railing? : Total 6 Click Score: 17    End of Session Equipment Utilized During Treatment: Oxygen Activity Tolerance: Treatment limited secondary to medical  complications (Comment)(lightheaded- likely orthostatic) Patient left: in bed;with call bell/phone within reach Nurse Communication: Mobility status;Other (comment)(lightheadedness) PT Visit Diagnosis: Unsteadiness on feet (R26.81);Other abnormalities of gait and mobility (R26.89);Muscle weakness (generalized) (M62.81)     Time: 1415-1430 PT Time Calculation (min) (ACUTE ONLY): 15 min  Charges:  $Gait Training: 8-22 mins                     [place] Pager [phone] Office [place] 04/14/2019, 4:36 PM

## 2019-04-15 LAB — CBC
HCT: 48.1 % (ref 39.0–52.0)
Hemoglobin: 16.3 g/dL (ref 13.0–17.0)
MCH: 30.6 pg (ref 26.0–34.0)
MCHC: 33.9 g/dL (ref 30.0–36.0)
MCV: 90.4 fL (ref 80.0–100.0)
Platelets: 279 10*3/uL (ref 150–400)
RBC: 5.32 MIL/uL (ref 4.22–5.81)
RDW: 14.2 % (ref 11.5–15.5)
WBC: 18.1 10*3/uL — ABNORMAL HIGH (ref 4.0–10.5)
nRBC: 0 % (ref 0.0–0.2)

## 2019-04-15 LAB — COMPREHENSIVE METABOLIC PANEL
ALT: 16 U/L (ref 0–44)
AST: 21 U/L (ref 15–41)
Albumin: 3.3 g/dL — ABNORMAL LOW (ref 3.5–5.0)
Alkaline Phosphatase: 99 U/L (ref 38–126)
Anion gap: 12 (ref 5–15)
BUN: 33 mg/dL — ABNORMAL HIGH (ref 8–23)
CO2: 24 mmol/L (ref 22–32)
Calcium: 8.6 mg/dL — ABNORMAL LOW (ref 8.9–10.3)
Chloride: 97 mmol/L — ABNORMAL LOW (ref 98–111)
Creatinine, Ser: 1 mg/dL (ref 0.61–1.24)
GFR calc Af Amer: >60 mL/min (ref >60)
GFR calc non Af Amer: >60 mL/min (ref >60)
Glucose, Bld: 181 mg/dL — ABNORMAL HIGH (ref 70–99)
Potassium: 5 mmol/L (ref 3.5–5.1)
Sodium: 133 mmol/L — ABNORMAL LOW (ref 135–145)
Total Bilirubin: 1.5 mg/dL — ABNORMAL HIGH (ref 0.3–1.2)
Total Protein: 6.4 g/dL — ABNORMAL LOW (ref 6.5–8.1)

## 2019-04-15 LAB — FERRITIN: Ferritin: 498 ng/mL — ABNORMAL HIGH (ref 24–336)

## 2019-04-15 LAB — GLUCOSE, CAPILLARY
Glucose-Capillary: 142 mg/dL — ABNORMAL HIGH (ref 70–99)
Glucose-Capillary: 156 mg/dL — ABNORMAL HIGH (ref 70–99)
Glucose-Capillary: 215 mg/dL — ABNORMAL HIGH (ref 70–99)
Glucose-Capillary: 181 mg/dL — ABNORMAL HIGH (ref 70–99)

## 2019-04-15 LAB — D-DIMER, QUANTITATIVE: D-Dimer, Quant: 16.52 ug/mL-FEU — ABNORMAL HIGH (ref 0.00–0.50)

## 2019-04-15 LAB — C-REACTIVE PROTEIN: CRP: 0.8 mg/dL (ref <1.0)

## 2019-04-15 NOTE — Progress Notes (Addendum)
PROGRESS NOTE                                                                                                                                                                                                             Patient Demographics:    Dustin Diaz, is a 74 y.o. male, DOB - 09/15/1945, [id number]  Outpatient Primary MD for the patient is Lavone Orn, MD   Admit date - 04/05/2019   LOS - 10  Chief Complaint  Patient presents with  . Covid positive  . low O2       Brief Narrative: Patient is a 74 y.o. male with PMHx of DM-2, HTN, PMR, HLD,  who presented to the hospital on 1/14 with shortness of breath-was found to have acute hypoxic respiratory failure secondary to COVID-19 pneumonia.  Hospital course complicated by slow improvement in hypoxia and lower extremity DVT.  See below for further details.   Subjective:   No major events overnight-required 5 L last night but on 2 L this morning.  No new complaints.   Assessment  & Plan :   Acute Hypoxic Resp Failure due to Covid 19 Viral pneumonia: Slow improvement continues-on 2 L-but requires 10-15 L with ambulation.  Slight worsening of leukocytosis-probably from steroids-we will repeat CBC/procalcitonin/chest x-ray tomorrow.  Fever: afebrile  O2 requirements:  SpO2: 90 %(80-85% while eating) O2 Flow Rate (L/min): 5 L/min FiO2 (%): 100 %   COVID-19 Labs: Recent Labs    04/13/19 0053 04/14/19 0440 04/15/19 1255  DDIMER 18.56* 18.99* 16.52*  FERRITIN  --   --  498*  CRP 0.9 0.6 0.8       Component Value Date/Time   BNP 48.6 04/14/2019 0440    Recent Labs  Lab 04/14/19 0440  PROCALCITON <0.10    No results found for: SARSCOV2NAA   COVID-19 Medications: Steroids: 1/14>>1/23 Remdesivir: 1/14>> 1/18 Actemra: 1/15 x 1, 1/16 x 1  Prone/Incentive Spirometry: encouraged  incentive spirometry use 3-4/hour.  DVT Prophylaxis  :  Eliquis  Left lower extremity DVT: Continue Eliquis-CTA chest on 1/18 without PE.  DM-2: CBGs better controlled-continue Lantus 12 units nightly, 4 units of NovoLog with meals and SSI.  Follow and optimize.   CBG (last 3)  Recent Labs    04/14/19 2034 04/15/19 0743 04/15/19 1131  GLUCAP 190* 142* 156*  HTN: BP controlled-continue Avapro and Norvasc  Dyslipidemia: Continue statin  History of PMR: Resume low-dose prednisone on discharge.  Right hilar lymphadenopathy: Likely reactive-repeat imaging in 3 to 6 months as outpatient.  Nutrition Problem: Nutrition Problem: Increased nutrient needs Etiology: catabolic [id number] PNA) Signs/Symptoms: estimated needs Interventions: Ensure Enlive (each supplement provides 350kcal and 20 grams of protein), Magic cup, Hormel Shake   Consults  :  None  Procedures  :  Non  ABG: No results found for: PHART, PCO2ART, PO2ART, HCO3, TCO2, ACIDBASEDEF, O2SAT  Vent Settings: N/A   Condition - Extremely Guarded  Family Communication  :  Spouse updated over the phone 1/24  Code Status :  Full Code  Diet :  Diet Order            Diet heart healthy/carb modified Room service appropriate? Yes; Fluid consistency: Thin  Diet effective now               Disposition Plan  :  Remain hospitalized-Home when hypoxia improves.  Barriers to discharge: Hypoxia requiring O2 supplementation  Antimicorbials  :    Anti-infectives (From admission, onward)   Start     Dose/Rate Route Frequency Ordered Stop   04/06/19 1000  remdesivir 100 mg in sodium chloride 0.9 % 100 mL IVPB  Status:  Discontinued     100 mg 200 mL/hr over 30 Minutes Intravenous Daily 04/05/19 1919 04/05/19 1919   04/06/19 1000  remdesivir 100 mg in sodium chloride 0.9 % 100 mL IVPB     100 mg 200 mL/hr over 30 Minutes Intravenous Daily 04/05/19 1919 04/09/19 0948   04/06/19 0400  azithromycin (ZITHROMAX) 500 mg in sodium chloride 0.9 % 250 mL IVPB  Status:   Discontinued     500 mg 250 mL/hr over 60 Minutes Intravenous Every 24 hours 04/06/19 0242 04/06/19 0725   04/06/19 0300  cefTRIAXone (ROCEPHIN) 1 g in sodium chloride 0.9 % 100 mL IVPB  Status:  Discontinued     1 g 200 mL/hr over 30 Minutes Intravenous Every 24 hours 04/06/19 0242 04/06/19 0725   04/05/19 2100  remdesivir 200 mg in sodium chloride 0.9% 250 mL IVPB     200 mg 580 mL/hr over 30 Minutes Intravenous Once 04/05/19 1919 04/05/19 2200   04/05/19 1918  remdesivir 200 mg in sodium chloride 0.9% 250 mL IVPB  Status:  Discontinued     200 mg 580 mL/hr over 30 Minutes Intravenous Once 04/05/19 1919 04/05/19 1919      Inpatient Medications  Scheduled Meds: . irbesartan  150 mg Oral Daily   And  . amLODipine  5 mg Oral Daily  . apixaban  10 mg Oral BID   Followed by  . [START ON 04/16/2019] apixaban  5 mg Oral BID  . vitamin C  500 mg Oral Daily  . atorvastatin  10 mg Oral Daily  . insulin aspart  0-20 Units Subcutaneous TID WC  . insulin aspart  0-5 Units Subcutaneous QHS  . insulin aspart  4 Units Subcutaneous TID WC  . insulin glargine  12 Units Subcutaneous QHS  . Ipratropium-Albuterol  1 puff Inhalation Q6H  . latanoprost  1 drop Both Eyes QHS  . pantoprazole  40 mg Oral Daily  . pioglitazone  45 mg Oral Daily  . sodium chloride flush  10-40 mL Intracatheter Q12H  . zinc sulfate  220 mg Oral Daily   Continuous Infusions: PRN Meds:.acetaminophen, chlorpheniramine-HYDROcodone, fluticasone, guaiFENesin-dextromethorphan, lip balm, ondansetron **OR** ondansetron (ZOFRAN) IV, sodium  chloride flush   Time Spent in minutes  25  See all Orders from today for further details   Oren Binet M.D on 04/15/2019 at 3:28 PM  To page go to [email or web address] - use universal password  [facility] -  Office  [phone]    Objective:   Vitals:   04/14/19 2035 04/15/19 0844 04/15/19 1204 04/15/19 1234  BP:  110/66 102/61   Pulse:  76 74 71  Resp:  (!) 21 19 20    Temp: 97.9 F (36.6 C) 98.1 F (36.7 C) 97.7 F (36.5 C) 97.8 F (36.6 C)  TempSrc: Oral Oral Oral Axillary  SpO2:  91% 92% 90%  Weight:      Height:        Wt Readings from Last 3 Encounters:  04/06/19 78 kg  07/24/12 93 kg     Intake/Output Summary (Last 24 hours) at 04/15/2019 1528 Last data filed at 04/15/2019 1237 Gross per 24 hour  Intake 860 ml  Output 1700 ml  Net -840 ml    Physical Exam Gen Exam:Alert awake-not in any distress HEENT:atraumatic, normocephalic Chest: B/L clear to auscultation anteriorly CVS:S1S2 regular Abdomen:soft non tender, non distended Extremities:no edema Neurology: Non focal Skin: no rash   Data Review:    CBC Recent Labs  Lab 04/10/19 0520 04/10/19 0520 04/11/19 0345 04/12/19 0136 04/13/19 0053 04/14/19 0440 04/15/19 1255  WBC 12.2*   < > 13.9* 12.8* 14.4* 13.2* 18.1*  HGB 14.4   < > 15.3 14.3 14.4 14.1 16.3  HCT 42.7   < > 46.3 42.5 42.7 42.2 48.1  PLT 344   < > 340 301 274 261 279  MCV 90.1   < > 90.3 89.7 89.7 89.8 90.4  MCH 30.4   < > 29.8 30.2 30.3 30.0 30.6  MCHC 33.7   < > 33.0 33.6 33.7 33.4 33.9  RDW 13.9   < > 13.8 13.8 13.9 13.9 14.2  LYMPHSABS 0.7  --  0.6* 0.4* 0.4* 0.5*  --   MONOABS 0.5  --  0.4 0.5 0.3 0.4  --   EOSABS 0.0  --  0.0 0.0 0.1 0.0  --   BASOSABS 0.1  --  0.0 0.1 0.1 0.0  --    < > = values in this interval not displayed.    Chemistries  Recent Labs  Lab 04/11/19 0345 04/12/19 0136 04/13/19 0053 04/14/19 0440 04/15/19 1255  NA 134* 135 132* 131* 133*  K 4.7 4.3 4.4 4.4 5.0  CL 100 101 98 96* 97*  CO2 24 24 23 26 24   GLUCOSE 171* 205* 162* 145* 181*  BUN 32* 33* 34* 40* 33*  CREATININE 0.69 0.86 0.79 0.89 1.00  CALCIUM 8.6* 8.3* 8.2* 8.3* 8.6*  AST 23 21 26 19 21   ALT 21 20 20 19 16   ALKPHOS 88 89 90 95 99  BILITOT 1.3* 0.8 1.0 1.1 1.5*   ------------------------------------------------------------------------------------------------------------------ No results for  input(s): CHOL, HDL, LDLCALC, TRIG, CHOLHDL, LDLDIRECT in the last 72 hours.  Lab Results  Component Value Date   HGBA1C 7.1 (H) 04/06/2019   ------------------------------------------------------------------------------------------------------------------ No results for input(s): TSH, T4TOTAL, T3FREE, THYROIDAB in the last 72 hours.  Invalid input(s): FREET3 ------------------------------------------------------------------------------------------------------------------ Recent Labs    04/15/19 1255  FERRITIN 498*    Coagulation profile No results for input(s): INR, PROTIME in the last 168 hours.  Recent Labs    04/14/19 0440 04/15/19 1255  DDIMER 18.99* 16.52*    Cardiac Enzymes No results for  input(s): CKMB, TROPONINI, MYOGLOBIN in the last 168 hours.  Invalid input(s): CK ------------------------------------------------------------------------------------------------------------------    Component Value Date/Time   BNP 48.6 04/14/2019 0440    Micro Results Recent Results (from the past 240 hour(s))  Blood Culture (routine x 2)     Status: None   Collection Time: 04/05/19  4:08 PM   Specimen: BLOOD LEFT HAND  Result Value Ref Range Status   Specimen Description   Final    BLOOD LEFT HAND Performed at [facility], [place] [address] [place], [place] [address]    Special Requests   Final    BOTTLES DRAWN AEROBIC AND ANAEROBIC Blood Culture adequate volume Performed at [place] [address] [place], [place] [address]    Culture   Final    NO GROWTH 5 DAYS Performed at [facility], [place] [address], [place], [place] [address]    Report Status 04/10/2019 FINAL  Final  Blood Culture (routine x 2)     Status: None   Collection Time: 04/05/19  4:19 PM   Specimen: BLOOD LEFT HAND  Result Value Ref Range Status   Specimen Description   Final    BLOOD LEFT HAND Performed at [facility]  [address] [place], [place] [address]    Special Requests   Final    BOTTLES DRAWN AEROBIC ONLY Blood Culture adequate volume Performed at [place] [place] [address] [place], [place] [address]    Culture   Final    NO GROWTH 5 DAYS Performed at [facility], [place] [address], [place],  [address]    Report Status 04/10/2019 FINAL  Final    Radiology Reports CT ANGIO CHEST PE W OR WO CONTRAST  Result Date: 04/09/2019 CLINICAL DATA:  COVID pneumonia, hypoxia, shortness of breath and elevated D-dimer. EXAM: CT ANGIOGRAPHY CHEST WITH CONTRAST TECHNIQUE: Multidetector CT imaging of the chest was performed using the standard protocol during bolus administration of intravenous contrast. Multiplanar CT image reconstructions and MIPs were obtained to evaluate the vascular anatomy. CONTRAST:  82mL OMNIPAQUE IOHEXOL 350 MG/ML SOLN COMPARISON:  Chest x-ray on 04/05/2019 FINDINGS: Cardiovascular: The pulmonary arteries are well opacified. There is no evidence pulmonary embolism. Central pulmonary arteries are normal in caliber. The heart size is normal. The thoracic aorta is normal in caliber. Trace amount of pericardial fluid. Calcified plaque is noted of the coronary arteries primarily in the distribution of the LAD. Mediastinum/Nodes: Mildly prominent right hilar nodal tissue measuring up to 1.5 cm. No evidence of enlarged mediastinal or axillary lymph nodes. Lungs/Pleura: Severe airspace disease is seen throughout all lobes of both lungs in a ground-glass pattern. There is an associated very small right pleural effusion. No pneumothorax. Upper Abdomen: No acute abnormality. Musculoskeletal: No chest wall abnormality. No acute or significant osseous findings. Review of the MIP images confirms the above findings. IMPRESSION: 1. No evidence of pulmonary embolism. 2. Severe airspace disease throughout all lobes of both lungs in a ground-glass pattern. Findings are consistent with  severe bilateral COVID-19 pneumonia. 3. Mildly prominent right hilar nodal tissue measuring up to 1.5 cm in short axis. This is likely reactive. 4. Coronary artery disease with calcified plaque in the distribution of the LAD. Electronically Signed   By: Aletta Edouard M.D.   On: 04/09/2019 08:58   DG Chest Port 1 View  Result Date: 04/05/2019 CLINICAL DATA:  COVID positive low oxygen level EXAM: PORTABLE CHEST 1 VIEW COMPARISON:  None. FINDINGS: Bilateral ground-glass opacities and consolidations. No pleural effusion. Normal heart size. Aortic atherosclerosis. No pneumothorax. IMPRESSION: Bilateral ground-glass opacities and consolidations consistent with bilateral pneumonia. Electronically Signed   By: Donavan Foil M.D.   On: 04/05/2019 16:47   VAS [place] LOWER EXTREMITY VENOUS (DVT)  Result Date: 04/09/2019  Lower Venous Study Indications: Covid positive with worsening positive d-Dimer and hypoxia.  Anticoagulation: Lovenox. Comparison Study: No priors. Performing Technologist: Oda Cogan RDMS, RVT  Examination Guidelines: A complete evaluation includes B-mode imaging, spectral Doppler, color Doppler, and power Doppler as needed of all accessible portions of each vessel. Bilateral testing is considered an integral part of a complete examination. Limited examinations for reoccurring indications may be performed as noted.  +---------+---------------+---------+-----------+----------+--------------+ RIGHT    CompressibilityPhasicitySpontaneityPropertiesThrombus Aging +---------+---------------+---------+-----------+----------+--------------+ CFV      Full           Yes      Yes                                 +---------+---------------+---------+-----------+----------+--------------+ SFJ      Full                                                        +---------+---------------+---------+-----------+----------+--------------+ FV Prox  Full                                                         +---------+---------------+---------+-----------+----------+--------------+ FV Mid   Full                                                        +---------+---------------+---------+-----------+----------+--------------+ FV DistalFull                                                        +---------+---------------+---------+-----------+----------+--------------+ PFV      Full                                                        +---------+---------------+---------+-----------+----------+--------------+ POP      Full           Yes      Yes                                 +---------+---------------+---------+-----------+----------+--------------+ PTV      Full                                                        +---------+---------------+---------+-----------+----------+--------------+  PERO     Full                                                        +---------+---------------+---------+-----------+----------+--------------+   +---------+---------------+---------+-----------+----------+--------------+ LEFT     CompressibilityPhasicitySpontaneityPropertiesThrombus Aging +---------+---------------+---------+-----------+----------+--------------+ CFV      Full           Yes      Yes                                 +---------+---------------+---------+-----------+----------+--------------+ SFJ      Full                                                        +---------+---------------+---------+-----------+----------+--------------+ FV Prox  Full                                                        +---------+---------------+---------+-----------+----------+--------------+ FV Mid   Full                                                        +---------+---------------+---------+-----------+----------+--------------+ FV DistalFull                                                         +---------+---------------+---------+-----------+----------+--------------+ PFV      Full                                                        +---------+---------------+---------+-----------+----------+--------------+ POP      Full           Yes      Yes                                 +---------+---------------+---------+-----------+----------+--------------+ PTV      Full                                                        +---------+---------------+---------+-----------+----------+--------------+ PERO     Partial                                      Acute          +---------+---------------+---------+-----------+----------+--------------+  Soleal   Full                                         Acute          +---------+---------------+---------+-----------+----------+--------------+     Summary: Right: There is no evidence of deep vein thrombosis in the lower extremity. No cystic structure found in the popliteal fossa. Left: Findings consistent with acute deep vein thrombosis involving the left peroneal veins, and left soleal veins. Short segment of localized thrombus in the peroneal and soleal veins in the mid segment.  *See table(s) above for measurements and observations. Electronically signed by Ruta Hinds MD on 04/09/2019 at 2:56:25 PM.    Final

## 2019-04-15 NOTE — Progress Notes (Signed)
Notified family of progress all questions answered and this nurse's number shared for further communication.  

## 2019-04-15 NOTE — Progress Notes (Signed)
Pt remains on 4-7L at rest However, still desats significantly w/ any activity, eating etc. (requiring 13-15L) & 10-15 min to recover No c/o pain Great appetite & good fluid intake    Updated wife yesterday & then spoke w/ son & daughter inlaw over facetime with him today ( & new GRANDBABY)  **Encouraged & educated to [employer] frequently Iv decadron Zinc/vit c PT/OT **IS/FV  ACHS  x2 new raised lesions on neck. Slightly pink/skin color, "blister like", no drainage & no itching. MD GHimire is aware and said he was going to come assess.

## 2019-04-16 ENCOUNTER — Inpatient Hospital Stay (HOSPITAL_COMMUNITY): Payer: Medicare HMO

## 2019-04-16 LAB — COMPREHENSIVE METABOLIC PANEL
ALT: 18 U/L (ref 0–44)
AST: 20 U/L (ref 15–41)
Albumin: 3.1 g/dL — ABNORMAL LOW (ref 3.5–5.0)
Alkaline Phosphatase: 85 U/L (ref 38–126)
Anion gap: 9 (ref 5–15)
BUN: 36 mg/dL — ABNORMAL HIGH (ref 8–23)
CO2: 28 mmol/L (ref 22–32)
Calcium: 8.6 mg/dL — ABNORMAL LOW (ref 8.9–10.3)
Chloride: 100 mmol/L (ref 98–111)
Creatinine, Ser: 0.94 mg/dL (ref 0.61–1.24)
GFR calc Af Amer: >60 mL/min (ref >60)
GFR calc non Af Amer: >60 mL/min (ref >60)
Glucose, Bld: 98 mg/dL (ref 70–99)
Potassium: 5.1 mmol/L (ref 3.5–5.1)
Sodium: 137 mmol/L (ref 135–145)
Total Bilirubin: 1.7 mg/dL — ABNORMAL HIGH (ref 0.3–1.2)
Total Protein: 5.8 g/dL — ABNORMAL LOW (ref 6.5–8.1)

## 2019-04-16 LAB — C-REACTIVE PROTEIN: CRP: 0.6 mg/dL (ref <1.0)

## 2019-04-16 LAB — CBC
HCT: 46.4 % (ref 39.0–52.0)
Hemoglobin: 15.4 g/dL (ref 13.0–17.0)
MCH: 30.3 pg (ref 26.0–34.0)
MCHC: 33.2 g/dL (ref 30.0–36.0)
MCV: 91.3 fL (ref 80.0–100.0)
Platelets: 239 10*3/uL (ref 150–400)
RBC: 5.08 MIL/uL (ref 4.22–5.81)
RDW: 14 % (ref 11.5–15.5)
WBC: 14.2 10*3/uL — ABNORMAL HIGH (ref 4.0–10.5)
nRBC: 0 % (ref 0.0–0.2)

## 2019-04-16 LAB — PROCALCITONIN: Procalcitonin: <0.1 ng/mL

## 2019-04-16 LAB — GLUCOSE, CAPILLARY
Glucose-Capillary: 105 mg/dL — ABNORMAL HIGH (ref 70–99)
Glucose-Capillary: 161 mg/dL — ABNORMAL HIGH (ref 70–99)
Glucose-Capillary: 267 mg/dL — ABNORMAL HIGH (ref 70–99)
Glucose-Capillary: 77 mg/dL (ref 70–99)

## 2019-04-16 LAB — D-DIMER, QUANTITATIVE: D-Dimer, Quant: 13.41 ug/mL-FEU — ABNORMAL HIGH (ref 0.00–0.50)

## 2019-04-16 LAB — FERRITIN: Ferritin: 418 ng/mL — ABNORMAL HIGH (ref 24–336)

## 2019-04-16 MED ORDER — PREDNISONE 10 MG PO TABS
10.0000 mg | ORAL_TABLET | Freq: Every day | ORAL | Status: DC
  Administered 2019-04-16 – 2019-04-18 (×3): 10 mg via ORAL
  Filled 2019-04-16 (×3): qty 1

## 2019-04-16 NOTE — Progress Notes (Signed)
PROGRESS NOTE                                                                                                                                                                                                             Patient Demographics:    Dustin Diaz, is a 74 y.o. male, DOB - 03-11-1946, [id number]  Outpatient Primary MD for the patient is Lavone Orn, MD   Admit date - 04/05/2019   LOS - 69  Chief Complaint  Patient presents with  . Covid positive  . low O2       Brief Narrative: Patient is a 74 y.o. male with PMHx of DM-2, HTN, PMR, HLD,  who presented to the hospital on 1/14 with shortness of breath-was found to have acute hypoxic respiratory failure secondary to COVID-19 pneumonia.  Hospital course complicated by slow improvement in hypoxia and lower extremity DVT.  See below for further details.   Subjective:    Frustrated due to waxing and waning clinical course-has developed a blister on his left lower lip-and 2 papular/blistering skin lesions on his neck-one of them has ulcerated in the center.  O2 requirements fluctuating-requiring anywhere from 5 to 8 L at rest-and 15 L with ambulation.   Assessment  & Plan :   Acute Hypoxic Resp Failure due to Covid 19 Viral pneumonia: Hypoxia continues to wax and wane-still very hypoxic with minimal ambulation.  Chest x-ray continues to show expected evolution of infiltrates-procalcitonin negative.  CRP is normalized.  Suspect needs watchful observation-continue to monitor for complications/progress   Fever: afebrile  O2 requirements:  SpO2: 93 % O2 Flow Rate (L/min): 15 L/min FiO2 (%): 100 %   COVID-19 Labs: Recent Labs    04/14/19 0440 04/15/19 1255 04/16/19 0755  DDIMER 18.99* 16.52* 13.41*  FERRITIN  --  498* 418*  CRP 0.6 0.8 0.6       Component Value Date/Time   BNP 48.6 04/14/2019 0440    Recent Labs  Lab 04/14/19 0440 04/16/19 0755    PROCALCITON <0.10 <0.10    No results found for: SARSCOV2NAA   COVID-19 Medications: Decadron: 1/14>>1/23 Remdesivir: 1/14>> 1/18 Actemra: 1/15 x 1, 1/16 x 1  Prone/Incentive Spirometry: encouraged  incentive spirometry use 3-4/hour.  DVT Prophylaxis  : Eliquis  Left lower extremity DVT: Continue Eliquis-CTA chest on 1/18 without PE.  DM-2:  CBGs better controlled-continue Lantus 12 units nightly, 4 units of NovoLog with meals and SSI.  Follow and optimize.   CBG (last 3)  Recent Labs    04/15/19 1708 04/15/19 2116 04/16/19 0724  GLUCAP 215* 181* 105*    HTN: BP controlled-continue Avapro and Norvasc  Dyslipidemia: Continue statin  History of PMR: Completed Decadron-we will restart prednisone-but will start at 10 mg dosage and slowly get him down to his usual regimen.  Right hilar lymphadenopathy: Likely reactive-repeat imaging in 3 to 6 months as outpatient.  Nodular skin lesion in his right neck area: We will ask several of my partners to take a look-not sure what the exact etiology is.  I will watch closely for now-if worsens-probably will require biopsy.  Nutrition Problem: Nutrition Problem: Increased nutrient needs Etiology: catabolic [id number] PNA) Signs/Symptoms: estimated needs Interventions: Ensure Enlive (each supplement provides 350kcal and 20 grams of protein), Magic cup, Hormel Shake   Consults  :  None  Procedures  :  Non  ABG: No results found for: PHART, PCO2ART, PO2ART, HCO3, TCO2, ACIDBASEDEF, O2SAT  Vent Settings: N/A   Condition - Extremely Guarded  Family Communication  :  Spouse updated over the phone 1/25  Code Status :  Full Code  Diet :  Diet Order            Diet heart healthy/carb modified Room service appropriate? Yes; Fluid consistency: Thin  Diet effective now               Disposition Plan  :  Remain hospitalized-Home when hypoxia improves.  Barriers to discharge: Hypoxia requiring O2  supplementation  Antimicorbials  :    Anti-infectives (From admission, onward)   Start     Dose/Rate Route Frequency Ordered Stop   04/06/19 1000  remdesivir 100 mg in sodium chloride 0.9 % 100 mL IVPB  Status:  Discontinued     100 mg 200 mL/hr over 30 Minutes Intravenous Daily 04/05/19 1919 04/05/19 1919   04/06/19 1000  remdesivir 100 mg in sodium chloride 0.9 % 100 mL IVPB     100 mg 200 mL/hr over 30 Minutes Intravenous Daily 04/05/19 1919 04/09/19 0948   04/06/19 0400  azithromycin (ZITHROMAX) 500 mg in sodium chloride 0.9 % 250 mL IVPB  Status:  Discontinued     500 mg 250 mL/hr over 60 Minutes Intravenous Every 24 hours 04/06/19 0242 04/06/19 0725   04/06/19 0300  cefTRIAXone (ROCEPHIN) 1 g in sodium chloride 0.9 % 100 mL IVPB  Status:  Discontinued     1 g 200 mL/hr over 30 Minutes Intravenous Every 24 hours 04/06/19 0242 04/06/19 0725   04/05/19 2100  remdesivir 200 mg in sodium chloride 0.9% 250 mL IVPB     200 mg 580 mL/hr over 30 Minutes Intravenous Once 04/05/19 1919 04/05/19 2200   04/05/19 1918  remdesivir 200 mg in sodium chloride 0.9% 250 mL IVPB  Status:  Discontinued     200 mg 580 mL/hr over 30 Minutes Intravenous Once 04/05/19 1919 04/05/19 1919      Inpatient Medications  Scheduled Meds: . irbesartan  150 mg Oral Daily   And  . amLODipine  5 mg Oral Daily  . apixaban  5 mg Oral BID  . vitamin C  500 mg Oral Daily  . atorvastatin  10 mg Oral Daily  . insulin aspart  0-20 Units Subcutaneous TID WC  . insulin aspart  0-5 Units Subcutaneous QHS  . insulin aspart  4 Units  Subcutaneous TID WC  . insulin glargine  12 Units Subcutaneous QHS  . Ipratropium-Albuterol  1 puff Inhalation Q6H  . latanoprost  1 drop Both Eyes QHS  . pantoprazole  40 mg Oral Daily  . pioglitazone  45 mg Oral Daily  . sodium chloride flush  10-40 mL Intracatheter Q12H  . zinc sulfate  220 mg Oral Daily   Continuous Infusions: PRN Meds:.acetaminophen,  chlorpheniramine-HYDROcodone, fluticasone, guaiFENesin-dextromethorphan, lip balm, ondansetron **OR** ondansetron (ZOFRAN) IV, sodium chloride flush   Time Spent in minutes  25  See all Orders from today for further details   Oren Binet M.D on 04/16/2019 at 3:12 PM  To page go to [email or web address] - use universal password  [facility] -  Office  [phone]    Objective:   Vitals:   04/16/19 0722 04/16/19 0723 04/16/19 0726 04/16/19 1200  BP:   113/60 94/64  Pulse: (!) 56 (!) 58 (!) 58   Resp:   18 20  Temp:   (!) 97.5 F (36.4 C)   TempSrc:   Oral Oral  SpO2:    93%  Weight:      Height:        Wt Readings from Last 3 Encounters:  04/06/19 78 kg  07/24/12 93 kg     Intake/Output Summary (Last 24 hours) at 04/16/2019 1512 Last data filed at 04/16/2019 1406 Gross per 24 hour  Intake 480 ml  Output 1650 ml  Net -1170 ml    Physical Exam Gen Exam:Alert awake-not in any distress HEENT:atraumatic, normocephalic Chest: B/L clear to auscultation anteriorly CVS:S1S2 regular Abdomen:soft non tender, non distended Extremities:no edema Neurology: Non focal Skin: no rash   Data Review:    CBC Recent Labs  Lab 04/10/19 0520 04/10/19 0520 04/11/19 0345 04/11/19 0345 04/12/19 0136 04/13/19 0053 04/14/19 0440 04/15/19 1255 04/16/19 0755  WBC 12.2*   < > 13.9*   < > 12.8* 14.4* 13.2* 18.1* 14.2*  HGB 14.4   < > 15.3   < > 14.3 14.4 14.1 16.3 15.4  HCT 42.7   < > 46.3   < > 42.5 42.7 42.2 48.1 46.4  PLT 344   < > 340   < > 301 274 261 279 239  MCV 90.1   < > 90.3   < > 89.7 89.7 89.8 90.4 91.3  MCH 30.4   < > 29.8   < > 30.2 30.3 30.0 30.6 30.3  MCHC 33.7   < > 33.0   < > 33.6 33.7 33.4 33.9 33.2  RDW 13.9   < > 13.8   < > 13.8 13.9 13.9 14.2 14.0  LYMPHSABS 0.7  --  0.6*  --  0.4* 0.4* 0.5*  --   --   MONOABS 0.5  --  0.4  --  0.5 0.3 0.4  --   --   EOSABS 0.0  --  0.0  --  0.0 0.1 0.0  --   --   BASOSABS 0.1  --  0.0  --  0.1 0.1 0.0  --   --    <  > = values in this interval not displayed.    Chemistries  Recent Labs  Lab 04/12/19 0136 04/13/19 0053 04/14/19 0440 04/15/19 1255 04/16/19 0755  NA 135 132* 131* 133* 137  K 4.3 4.4 4.4 5.0 5.1  CL 101 98 96* 97* 100  CO2 24 23 26 24 28   GLUCOSE 205* 162* 145* 181* 98  BUN 33* 34* 40* 33*  36*  CREATININE 0.86 0.79 0.89 1.00 0.94  CALCIUM 8.3* 8.2* 8.3* 8.6* 8.6*  AST 21 26 19 21 20   ALT 20 20 19 16 18   ALKPHOS 89 90 95 99 85  BILITOT 0.8 1.0 1.1 1.5* 1.7*   ------------------------------------------------------------------------------------------------------------------ No results for input(s): CHOL, HDL, LDLCALC, TRIG, CHOLHDL, LDLDIRECT in the last 72 hours.  Lab Results  Component Value Date   HGBA1C 7.1 (H) 04/06/2019   ------------------------------------------------------------------------------------------------------------------ No results for input(s): TSH, T4TOTAL, T3FREE, THYROIDAB in the last 72 hours.  Invalid input(s): FREET3 ------------------------------------------------------------------------------------------------------------------ Recent Labs    04/15/19 1255 04/16/19 0755  FERRITIN 498* 418*    Coagulation profile No results for input(s): INR, PROTIME in the last 168 hours.  Recent Labs    04/15/19 1255 04/16/19 0755  DDIMER 16.52* 13.41*    Cardiac Enzymes No results for input(s): CKMB, TROPONINI, MYOGLOBIN in the last 168 hours.  Invalid input(s): CK ------------------------------------------------------------------------------------------------------------------    Component Value Date/Time   BNP 48.6 04/14/2019 0440    Micro Results No results found for this or any previous visit (from the past 240 hour(s)).  Radiology Reports CT ANGIO CHEST PE W OR WO CONTRAST  Result Date: 04/09/2019 CLINICAL DATA:  COVID pneumonia, hypoxia, shortness of breath and elevated D-dimer. EXAM: CT ANGIOGRAPHY CHEST WITH CONTRAST TECHNIQUE:  Multidetector CT imaging of the chest was performed using the standard protocol during bolus administration of intravenous contrast. Multiplanar CT image reconstructions and MIPs were obtained to evaluate the vascular anatomy. CONTRAST:  27mL OMNIPAQUE IOHEXOL 350 MG/ML SOLN COMPARISON:  Chest x-ray on 04/05/2019 FINDINGS: Cardiovascular: The pulmonary arteries are well opacified. There is no evidence pulmonary embolism. Central pulmonary arteries are normal in caliber. The heart size is normal. The thoracic aorta is normal in caliber. Trace amount of pericardial fluid. Calcified plaque is noted of the coronary arteries primarily in the distribution of the LAD. Mediastinum/Nodes: Mildly prominent right hilar nodal tissue measuring up to 1.5 cm. No evidence of enlarged mediastinal or axillary lymph nodes. Lungs/Pleura: Severe airspace disease is seen throughout all lobes of both lungs in a ground-glass pattern. There is an associated very small right pleural effusion. No pneumothorax. Upper Abdomen: No acute abnormality. Musculoskeletal: No chest wall abnormality. No acute or significant osseous findings. Review of the MIP images confirms the above findings. IMPRESSION: 1. No evidence of pulmonary embolism. 2. Severe airspace disease throughout all lobes of both lungs in a ground-glass pattern. Findings are consistent with severe bilateral COVID-19 pneumonia. 3. Mildly prominent right hilar nodal tissue measuring up to 1.5 cm in short axis. This is likely reactive. 4. Coronary artery disease with calcified plaque in the distribution of the LAD. Electronically Signed   By: Aletta Edouard M.D.   On: 04/09/2019 08:58   DG Chest Port 1 View  Result Date: 04/16/2019 CLINICAL DATA:  74 year old male with a history of COVID pneumonia EXAM: PORTABLE CHEST 1 VIEW COMPARISON:  CT 04/09/2019, plain film 04/05/2019 FINDINGS: Cardiomediastinal silhouette unchanged in size and contour. Similar appearance of low lung volumes  and mixed airspace and interstitial opacities of the bilateral lungs. No pneumothorax or pleural effusion. IMPRESSION: Unchanged appearance of the lungs with mixed interstitial and airspace disease bilateral compatible with multifocal pneumonia Electronically Signed   By: Corrie Mckusick D.O.   On: 04/16/2019 07:48   DG Chest Port 1 View  Result Date: 04/05/2019 CLINICAL DATA:  COVID positive low oxygen level EXAM: PORTABLE CHEST 1 VIEW COMPARISON:  None. FINDINGS: Bilateral ground-glass opacities and consolidations.  No pleural effusion. Normal heart size. Aortic atherosclerosis. No pneumothorax. IMPRESSION: Bilateral ground-glass opacities and consolidations consistent with bilateral pneumonia. Electronically Signed   By: Donavan Foil M.D.   On: 04/05/2019 16:47   VAS [place] LOWER EXTREMITY VENOUS (DVT)  Result Date: 04/09/2019  Lower Venous Study Indications: Covid positive with worsening positive d-Dimer and hypoxia.  Anticoagulation: Lovenox. Comparison Study: No priors. Performing Technologist: Oda Cogan RDMS, RVT  Examination Guidelines: A complete evaluation includes B-mode imaging, spectral Doppler, color Doppler, and power Doppler as needed of all accessible portions of each vessel. Bilateral testing is considered an integral part of a complete examination. Limited examinations for reoccurring indications may be performed as noted.  +---------+---------------+---------+-----------+----------+--------------+ RIGHT    CompressibilityPhasicitySpontaneityPropertiesThrombus Aging +---------+---------------+---------+-----------+----------+--------------+ CFV      Full           Yes      Yes                                 +---------+---------------+---------+-----------+----------+--------------+ SFJ      Full                                                        +---------+---------------+---------+-----------+----------+--------------+ FV Prox  Full                                                         +---------+---------------+---------+-----------+----------+--------------+ FV Mid   Full                                                        +---------+---------------+---------+-----------+----------+--------------+ FV DistalFull                                                        +---------+---------------+---------+-----------+----------+--------------+ PFV      Full                                                        +---------+---------------+---------+-----------+----------+--------------+ POP      Full           Yes      Yes                                 +---------+---------------+---------+-----------+----------+--------------+ PTV      Full                                                        +---------+---------------+---------+-----------+----------+--------------+  PERO     Full                                                        +---------+---------------+---------+-----------+----------+--------------+   +---------+---------------+---------+-----------+----------+--------------+ LEFT     CompressibilityPhasicitySpontaneityPropertiesThrombus Aging +---------+---------------+---------+-----------+----------+--------------+ CFV      Full           Yes      Yes                                 +---------+---------------+---------+-----------+----------+--------------+ SFJ      Full                                                        +---------+---------------+---------+-----------+----------+--------------+ FV Prox  Full                                                        +---------+---------------+---------+-----------+----------+--------------+ FV Mid   Full                                                        +---------+---------------+---------+-----------+----------+--------------+ FV DistalFull                                                         +---------+---------------+---------+-----------+----------+--------------+ PFV      Full                                                        +---------+---------------+---------+-----------+----------+--------------+ POP      Full           Yes      Yes                                 +---------+---------------+---------+-----------+----------+--------------+ PTV      Full                                                        +---------+---------------+---------+-----------+----------+--------------+ PERO     Partial                                      Acute          +---------+---------------+---------+-----------+----------+--------------+  Soleal   Full                                         Acute          +---------+---------------+---------+-----------+----------+--------------+     Summary: Right: There is no evidence of deep vein thrombosis in the lower extremity. No cystic structure found in the popliteal fossa. Left: Findings consistent with acute deep vein thrombosis involving the left peroneal veins, and left soleal veins. Short segment of localized thrombus in the peroneal and soleal veins in the mid segment.  *See table(s) above for measurements and observations. Electronically signed by Ruta Hinds MD on 04/09/2019 at 2:56:25 PM.    Final

## 2019-04-16 NOTE — Progress Notes (Signed)
Physical Therapy Treatment Patient Details Name: Dustin Diaz MRN: [id number] DOB: [birth year] Today's Date: 04/16/2019    History of Present Illness 74 year old male admitted with Covid /Pneumonia. PMH includes DM, HTN, hyperlipidemia, polymyalgia rheumatica.    PT Comments    The patient received on 8 L HFNC with SPO2 dropping from 92%  to 77% with minimal activity.   Requires several rest breaks for mbililzing to recliner. BP pre 122/68, sitting 94/60. Patient reports mildly lightheaded but tolerated moving to recliner and standing a second time. Patient is limited due to hypoxia with activity. RN aware and may increase O2 as needed. Continue PT for progressive activity and monitor of VS.  Follow Up Recommendations  Home health PT;Supervision - Intermittent     Equipment Recommendations    TBA   Recommendations for Other Services       Precautions / Restrictions Precautions Precautions: Fall;Other (comment) Precaution Comments: monitor O2 Sats and check orthostatics    Mobility  Bed Mobility Overal bed mobility: Independent                Transfers Overall transfer level: Needs assistance Equipment used: Rolling walker (2 wheeled) Transfers: Sit to/from Stand Sit to Stand: Min guard         General transfer comment: Min guard for safety coming to stand and min assist to sit due to lightheadedness.   Ambulation/Gait Ambulation/Gait assistance: Min assist Gait Distance (Feet): 6 Feet Assistive device: Rolling walker (2 wheeled) Gait Pattern/deviations: Step-through pattern;Decreased step length - right;Decreased step length - left;Staggering left;Staggering right     General Gait Details: Initially min guard for safety and lines but then pt staggering and required min  assist to correct. only to recliner. Stood again to change underware.   Stairs             Wheelchair Mobility    Modified Rankin (Stroke Patients Only)       Balance  Overall balance assessment: Needs assistance   Sitting balance-Leahy Scale: Good     Standing balance support: No upper extremity supported;Single extremity supported Standing balance-Leahy Scale: Poor Standing balance comment: steady assist while pulling up briefs                            Cognition Arousal/Alertness: Awake/alert Behavior During Therapy: Restless                                   General Comments: patient expresses concern that he he is SOB for minimal movement.      Exercises      General Comments        Pertinent Vitals/Pain Pain Assessment: No/denies pain    Home Living                      Prior Function            PT Goals (current goals can now be found in the care plan section) Progress towards PT goals: Progressing toward goals(very slowly due to respiratory)    Frequency    Min 3X/week      PT Plan Current plan remains appropriate    Co-evaluation              AM-PAC PT "6 Clicks" Mobility   Outcome Measure  Help needed turning from your back to your side while  in a flat bed without using bedrails?: None Help needed moving from lying on your back to sitting on the side of a flat bed without using bedrails?: None Help needed moving to and from a bed to a chair (including a wheelchair)?: A Little Help needed standing up from a chair using your arms (e.g., wheelchair or bedside chair)?: A Little Help needed to walk in hospital room?: A Lot Help needed climbing 3-5 steps with a railing? : Total 6 Click Score: 17    End of Session Equipment Utilized During Treatment: Oxygen Activity Tolerance: Treatment limited secondary to medical complications (Comment) Patient left: in chair;with call bell/phone within reach Nurse Communication: Mobility status;Other (comment) PT Visit Diagnosis: Unsteadiness on feet (R26.81);Other abnormalities of gait and mobility (R26.89);Muscle weakness  (generalized) (M62.81)     Time: [id number] PT Time Calculation (min) (ACUTE ONLY): 26 min  Charges:  $Therapeutic Activity: 23-37 mins                     Tresa Endo PT Acute Rehabilitation Services Pager [phone] Office [phone]    Claretha Cooper 04/16/2019, 1:27 PM

## 2019-04-17 LAB — COMPREHENSIVE METABOLIC PANEL
ALT: 18 U/L (ref 0–44)
AST: 23 U/L (ref 15–41)
Albumin: 3.1 g/dL — ABNORMAL LOW (ref 3.5–5.0)
Alkaline Phosphatase: 96 U/L (ref 38–126)
Anion gap: 10 (ref 5–15)
BUN: 32 mg/dL — ABNORMAL HIGH (ref 8–23)
CO2: 26 mmol/L (ref 22–32)
Calcium: 8.5 mg/dL — ABNORMAL LOW (ref 8.9–10.3)
Chloride: 99 mmol/L (ref 98–111)
Creatinine, Ser: 0.78 mg/dL (ref 0.61–1.24)
GFR calc Af Amer: >60 mL/min (ref >60)
GFR calc non Af Amer: >60 mL/min (ref >60)
Glucose, Bld: 117 mg/dL — ABNORMAL HIGH (ref 70–99)
Potassium: 4.9 mmol/L (ref 3.5–5.1)
Sodium: 135 mmol/L (ref 135–145)
Total Bilirubin: 0.8 mg/dL (ref 0.3–1.2)
Total Protein: 5.9 g/dL — ABNORMAL LOW (ref 6.5–8.1)

## 2019-04-17 LAB — CBC
HCT: 45.1 % (ref 39.0–52.0)
Hemoglobin: 14.7 g/dL (ref 13.0–17.0)
MCH: 29.9 pg (ref 26.0–34.0)
MCHC: 32.6 g/dL (ref 30.0–36.0)
MCV: 91.7 fL (ref 80.0–100.0)
Platelets: 228 10*3/uL (ref 150–400)
RBC: 4.92 MIL/uL (ref 4.22–5.81)
RDW: 14.2 % (ref 11.5–15.5)
WBC: 17.1 10*3/uL — ABNORMAL HIGH (ref 4.0–10.5)
nRBC: 0 % (ref 0.0–0.2)

## 2019-04-17 LAB — FERRITIN: Ferritin: 417 ng/mL — ABNORMAL HIGH (ref 24–336)

## 2019-04-17 LAB — GLUCOSE, CAPILLARY
Glucose-Capillary: 90 mg/dL (ref 70–99)
Glucose-Capillary: 224 mg/dL — ABNORMAL HIGH (ref 70–99)
Glucose-Capillary: 282 mg/dL — ABNORMAL HIGH (ref 70–99)
Glucose-Capillary: 185 mg/dL — ABNORMAL HIGH (ref 70–99)

## 2019-04-17 LAB — BRAIN NATRIURETIC PEPTIDE: B Natriuretic Peptide: 58.7 pg/mL (ref 0.0–100.0)

## 2019-04-17 LAB — C-REACTIVE PROTEIN: CRP: 0.6 mg/dL (ref <1.0)

## 2019-04-17 LAB — D-DIMER, QUANTITATIVE: D-Dimer, Quant: 10.86 ug/mL-FEU — ABNORMAL HIGH (ref 0.00–0.50)

## 2019-04-17 LAB — MRSA PCR SCREENING: MRSA by PCR: NEGATIVE

## 2019-04-17 MED ORDER — SODIUM CHLORIDE 0.9 % IV SOLN
2.0000 g | Freq: Three times a day (TID) | INTRAVENOUS | Status: DC
  Administered 2019-04-17 – 2019-04-18 (×4): 2 g via INTRAVENOUS
  Filled 2019-04-17 (×4): qty 2

## 2019-04-17 MED ORDER — INSULIN GLARGINE 100 UNIT/ML ~~LOC~~ SOLN
6.0000 [IU] | Freq: Every day | SUBCUTANEOUS | Status: DC
  Administered 2019-04-17 – 2019-04-26 (×10): 6 [IU] via SUBCUTANEOUS
  Filled 2019-04-17 (×11): qty 0.06

## 2019-04-17 MED ORDER — INSULIN ASPART 100 UNIT/ML ~~LOC~~ SOLN
0.0000 [IU] | Freq: Three times a day (TID) | SUBCUTANEOUS | Status: DC
  Administered 2019-04-17: 8 [IU] via SUBCUTANEOUS
  Administered 2019-04-18: 3 [IU] via SUBCUTANEOUS
  Administered 2019-04-18: 8 [IU] via SUBCUTANEOUS
  Administered 2019-04-18: 5 [IU] via SUBCUTANEOUS
  Administered 2019-04-19: 8 [IU] via SUBCUTANEOUS
  Administered 2019-04-19 – 2019-04-20 (×2): 5 [IU] via SUBCUTANEOUS
  Administered 2019-04-20: 3 [IU] via SUBCUTANEOUS
  Administered 2019-04-20 – 2019-04-21 (×2): 2 [IU] via SUBCUTANEOUS
  Administered 2019-04-21 (×2): 3 [IU] via SUBCUTANEOUS
  Administered 2019-04-22: 08:00:00 2 [IU] via SUBCUTANEOUS
  Administered 2019-04-22: 5 [IU] via SUBCUTANEOUS
  Administered 2019-04-22 – 2019-04-23 (×2): 2 [IU] via SUBCUTANEOUS
  Administered 2019-04-23 – 2019-04-24 (×2): 3 [IU] via SUBCUTANEOUS
  Administered 2019-04-24: 5 [IU] via SUBCUTANEOUS
  Administered 2019-04-24: 3 [IU] via SUBCUTANEOUS
  Administered 2019-04-25 (×2): 2 [IU] via SUBCUTANEOUS
  Administered 2019-04-26: 9 [IU] via SUBCUTANEOUS
  Administered 2019-04-26: 2 [IU] via SUBCUTANEOUS
  Administered 2019-04-26: 5 [IU] via SUBCUTANEOUS
  Administered 2019-04-27 (×2): 2 [IU] via SUBCUTANEOUS
  Administered 2019-04-27: 11 [IU] via SUBCUTANEOUS
  Administered 2019-04-28: 2 [IU] via SUBCUTANEOUS
  Administered 2019-04-28: 8 [IU] via SUBCUTANEOUS
  Administered 2019-04-29: 2 [IU] via SUBCUTANEOUS
  Administered 2019-04-29: 5 [IU] via SUBCUTANEOUS
  Administered 2019-04-29: 12:00:00 3 [IU] via SUBCUTANEOUS
  Administered 2019-04-30: 2 [IU] via SUBCUTANEOUS
  Administered 2019-04-30: 5 [IU] via SUBCUTANEOUS
  Administered 2019-05-01: 3 [IU] via SUBCUTANEOUS
  Administered 2019-05-01: 2 [IU] via SUBCUTANEOUS
  Administered 2019-05-01: 11 [IU] via SUBCUTANEOUS
  Administered 2019-05-02: 5 [IU] via SUBCUTANEOUS
  Administered 2019-05-02: 3 [IU] via SUBCUTANEOUS
  Administered 2019-05-02 – 2019-05-03 (×3): 2 [IU] via SUBCUTANEOUS

## 2019-04-17 MED ORDER — VANCOMYCIN HCL IN DEXTROSE 1-5 GM/200ML-% IV SOLN
1000.0000 mg | Freq: Two times a day (BID) | INTRAVENOUS | Status: DC
  Administered 2019-04-18: 1000 mg via INTRAVENOUS
  Filled 2019-04-17 (×2): qty 200

## 2019-04-17 MED ORDER — VANCOMYCIN HCL 1500 MG/300ML IV SOLN
1500.0000 mg | Freq: Once | INTRAVENOUS | Status: AC
  Administered 2019-04-17: 1500 mg via INTRAVENOUS
  Filled 2019-04-17: qty 300

## 2019-04-17 MED ORDER — ALPRAZOLAM 0.25 MG PO TABS
0.2500 mg | ORAL_TABLET | Freq: Two times a day (BID) | ORAL | Status: DC | PRN
  Administered 2019-04-19 – 2019-04-29 (×4): 0.25 mg via ORAL
  Filled 2019-04-17 (×4): qty 1

## 2019-04-17 NOTE — Progress Notes (Signed)
Pharmacy Antibiotic Note  Dustin Diaz is a 74 y.o. male admitted on 04/05/2019 with COVID-19 pneumonia.  Pharmacy has been consulted for vancomycin and cefepime dosing for possible HAP given worsening leukocytosis and hypoxia.  Plan:  Vancomycin 1500 IV x 1, then 1g IV q12h for estimated AUC 469 (goal 400-550)  Consider checking levels at steady state if needed  Cefepime 2g IV q8h  Follow up renal function & cultures  Height: 5\' 10"  (177.8 cm) Weight: 171 lb 15.3 oz (78 kg) IBW/kg (Calculated) : 73  Temp (24hrs), Avg:97.8 F (36.6 C), Min:97.2 F (36.2 C), Max:98.7 F (37.1 C)  Recent Labs  Lab 04/13/19 0053 04/14/19 0440 04/15/19 1255 04/16/19 0755 04/17/19 0155  WBC 14.4* 13.2* 18.1* 14.2* 17.1*  CREATININE 0.79 0.89 1.00 0.94 0.78    Estimated Creatinine Clearance: 86.2 mL/min (by C-G formula based on SCr of 0.78 mg/dL).    Allergies  Allergen Reactions  . Codeine     Antimicrobials this admission: 1/26 Vancomycin >> 1/26 Cefepime >>  Dose adjustments this admission:  Microbiology results: 1/14 BCx: NGTD 1/26 MRSA PCR:   Thank you for allowing pharmacy to be a part of this patient's care.  Peggyann Juba, PharmD, BCPS Pharmacy: [phone] 04/17/2019 2:01 PM

## 2019-04-17 NOTE — Progress Notes (Signed)
@  1930 patient has increased anxiety and SOB. Pt stated that he feels smothered and can't catch his breath even with O2 in the 90%. I put on a NRB for comfort  and the patient relaxed. The patient stated he felt as if he had an anxiety attach. Patient is now (0440) on 7L HFNC with O2 in the high 90%. Spoke with wife and she feels his anxiety has increase since admission.

## 2019-04-17 NOTE — Progress Notes (Signed)
Inpatient Diabetes Program Recommendations  AACE/ADA: New Consensus Statement on Inpatient Glycemic Control   Target Ranges:  Prepandial:   less than 140 mg/dL      Peak postprandial:   less than 180 mg/dL (1-2 hours)      Critically ill patients:  140 - 180 mg/dL   Results for Dustin Diaz, Dustin Diaz (MRN [id number]) as of 04/17/2019 12:31  Ref. Range 04/16/2019 07:24 04/16/2019 12:07 04/16/2019 15:18 04/16/2019 19:49 04/17/2019 07:59  Glucose-Capillary Latest Ref Range: 70 - 99 mg/dL 105 (H) 161 (H) 267 (H) 77 90   Review of Glycemic Control  Diabetes history: DM2 Outpatient Diabetes medications: Amarl 2 mg daily, Jardiance 25 mg daily, Metformin 2000 mg daily, Actos 45 mg daily Current orders for Inpatient glycemic control: Lantus 12 units QHS, Novolog 4 units TID with meals, Novolog 0-20 units TID with meals, Novolog 0-5 units QHS, Actos 45 mg daily; Prednisone 10 mg daily  Inpatient Diabetes Program Recommendations:   Insulin - Basal: Fasting glucose 90 mg/dl today. Please consider decreasing Lantus to 10 units QHS.  Oral Agents: If patient has any issues with hypoglycemia, please consider discontinuing Actos while inpatient.  Thanks, Barnie Alderman, RN, MSN, CDE Diabetes Coordinator Inpatient Diabetes Program [phone] (Team Pager from 8am to 5pm)

## 2019-04-17 NOTE — Progress Notes (Signed)
Physical Therapy Treatment Patient Details Name: Dustin Diaz MRN: [id number] DOB: 1945/03/31 Today's Date: 04/17/2019    History of Present Illness 74 year old male admitted with Covid /Pneumonia. PMH includes DM, HTN, hyperlipidemia, polymyalgia rheumatica.    PT Comments    Pt apprehensive about mobility this am, states that he had some episode last night and was not use what it was but took him a long time to recover from. This am Pt agreeable to sitting in recliner for some time. Pt was able to get to edge of bed on his own and sat supported and unsupported x approx 10 mins, was able to complete flutter valve use and also incentive spirometer use while sitting. While sitting edge of bed sats remained in mid-high 80s although pt was c/o not being able to breathe. Pt transferred to recliner with stand by assist and line management help, once in recliner sats dropped to min 78% Pt took approx 4mns to recover to high 80s again with pursed lip breathing. Pt was on HFNC 7L/min throughout session.     Follow Up Recommendations  Home health PT;Supervision - Intermittent     Equipment Recommendations  None recommended by PT    Recommendations for Other Services       Precautions / Restrictions Precautions Precautions: Fall;Other (comment)(desats w/ min activity) Precaution Comments: monitor O2 Sats and check orthostatics Restrictions Weight Bearing Restrictions: No    Mobility  Bed Mobility Overal bed mobility: Independent                Transfers Overall transfer level: Needs assistance Equipment used: None Transfers: Sit to/from Stand;Stand Pivot Transfers Sit to Stand: Supervision Stand pivot transfers: Supervision          Ambulation/Gait                 Stairs             Wheelchair Mobility    Modified Rankin (Stroke Patients Only)       Balance   Sitting-balance support: Feet supported Sitting balance-Leahy Scale: Good      Standing balance support: During functional activity Standing balance-Leahy Scale: Fair                              Cognition Arousal/Alertness: Awake/alert Behavior During Therapy: WFL for tasks assessed/performed Overall Cognitive Status: Within Functional Limits for tasks assessed                                        Exercises Other Exercises Other Exercises: IS use able to pull max 526ml Other Exercises: flutter valve use    General Comments        Pertinent Vitals/Pain Pain Assessment: No/denies pain    Home Living                      Prior Function            PT Goals (current goals can now be found in the care plan section) Acute Rehab PT Goals Patient Stated Goal: eager to get home to family PT Goal Formulation: With patient Time For Goal Achievement: 04/25/19 Potential to Achieve Goals: Good Progress towards PT goals: Progressing toward goals    Frequency    Min 3X/week      PT Plan Current plan remains  appropriate    Co-evaluation              AM-PAC PT "6 Clicks" Mobility   Outcome Measure  Help needed turning from your back to your side while in a flat bed without using bedrails?: None Help needed moving from lying on your back to sitting on the side of a flat bed without using bedrails?: None Help needed moving to and from a bed to a chair (including a wheelchair)?: A Little Help needed standing up from a chair using your arms (e.g., wheelchair or bedside chair)?: A Little Help needed to walk in hospital room?: A Lot Help needed climbing 3-5 steps with a railing? : Total 6 Click Score: 17    End of Session Equipment Utilized During Treatment: Oxygen Activity Tolerance: Treatment limited secondary to medical complications (Comment);Patient limited by fatigue;Patient limited by lethargy Patient left: in chair;with call bell/phone within reach Nurse Communication: Mobility status PT Visit  Diagnosis: Unsteadiness on feet (R26.81);Other abnormalities of gait and mobility (R26.89);Muscle weakness (generalized) (M62.81)     Time: [id number] PT Time Calculation (min) (ACUTE ONLY): 24 min  Charges:  $Therapeutic Activity: 23-37 mins                     Horald Chestnut, PT    Delford Field 04/17/2019, 10:29 AM

## 2019-04-17 NOTE — Progress Notes (Signed)
PROGRESS NOTE                                                                                                                                                                                                             Patient Demographics:    Dustin Diaz, is a 74 y.o. male, DOB - [birth year], [id number]  Outpatient Primary MD for the patient is Lavone Orn, MD   Admit date - 04/05/2019   LOS - 12  Chief Complaint  Patient presents with  . Covid positive  . low O2       Brief Narrative: Patient is a 74 y.o. male with PMHx of DM-2, HTN, PMR, HLD,  who presented to the hospital on 1/14 with shortness of breath-was found to have acute hypoxic respiratory failure secondary to COVID-19 pneumonia.  Hospital course complicated by slow improvement in hypoxia and lower extremity DVT.  See below for further details.   Subjective:   Doing well at rest-on 3 L of oxygen.  Somewhat calmer today-claims he had a spell of anxiety yesterday evening.  Neck lesions are stable.   Assessment  & Plan :   Acute Hypoxic Resp Failure due to Covid 19 Viral pneumonia: Has had waxing and waning hypoxemia over the past few days-he is down to 3 L at rest but still requires 10-15 L with ambulation-he continues to be very symptomatic with minimal exertion.Although his procalcitonin is negative-he has worsening leukocytosis-suspect that given lingering hypoxemia-it may be appropriate to cover him with empiric antibiotics for a few days.  Start empiric vancomycin/cefepime-check MRSA PCR.    Fever: afebrile  O2 requirements:  SpO2: 90 % O2 Flow Rate (L/min): 3 L/min FiO2 (%): 100 %   COVID-19 Labs: Recent Labs    04/15/19 1255 04/16/19 0755 04/17/19 0155  DDIMER 16.52* 13.41* 10.86*  FERRITIN 498* 418* 417*  CRP 0.8 0.6 0.6       Component Value Date/Time   BNP 58.7 04/17/2019 0155    Recent Labs  Lab 04/14/19 0440  04/16/19 0755  PROCALCITON <0.10 <0.10    No results found for: SARSCOV2NAA   COVID-19 Medications: Decadron: 1/14>>1/23 Remdesivir: 1/14>> 1/18 Actemra: 1/15 x 1, 1/16 x 1  Prone/Incentive Spirometry: encouraged  incentive spirometry use 3-4/hour.  DVT Prophylaxis  : Eliquis  Left lower extremity DVT: Continue Eliquis-CTA chest on 1/18  without PE.  DM-2: CBG stable but had very poor appetite yesterday evening-has had borderline hypoglycemia last night and this morning-decrease Lantus to 6 units.  Follow closely-May need to cut back on NovoLog insulin as well.  CBG (last 3)  Recent Labs    04/16/19 1949 04/17/19 0759 04/17/19 1119  GLUCAP 77 90 224*    HTN: BP controlled-gets dizzy when he stands up-hold all antihypertensives for now.  Dyslipidemia: Continue statin  History of PMR: On prednisone-with plans to slowly titrate back down to his usual regimen.  Right hilar lymphadenopathy: Likely reactive-repeat imaging in 3 to 6 months as outpatient.  Nodular skin lesion in his right neck area: Etiology uncertain-plans are to monitor-if worsens-we will require a biopsy at some point.   Nutrition Problem: Nutrition Problem: Increased nutrient needs Etiology: catabolic [id number] PNA) Signs/Symptoms: estimated needs Interventions: Ensure Enlive (each supplement provides 350kcal and 20 grams of protein), Magic cup, Hormel Shake   Consults  :  None  Procedures  :  Non  ABG: No results found for: PHART, PCO2ART, PO2ART, HCO3, TCO2, ACIDBASEDEF, O2SAT  Vent Settings: N/A   Condition - Extremely Guarded  Family Communication  :  Spouse updated over the phone 1/26  Code Status :  Full Code  Diet :  Diet Order            Diet heart healthy/carb modified Room service appropriate? Yes; Fluid consistency: Thin  Diet effective now               Disposition Plan  :  Remain hospitalized-Home when hypoxia improves.  Barriers to discharge: Hypoxia requiring  O2 supplementation  Antimicorbials  :    Anti-infectives (From admission, onward)   Start     Dose/Rate Route Frequency Ordered Stop   04/06/19 1000  remdesivir 100 mg in sodium chloride 0.9 % 100 mL IVPB  Status:  Discontinued     100 mg 200 mL/hr over 30 Minutes Intravenous Daily 04/05/19 1919 04/05/19 1919   04/06/19 1000  remdesivir 100 mg in sodium chloride 0.9 % 100 mL IVPB     100 mg 200 mL/hr over 30 Minutes Intravenous Daily 04/05/19 1919 04/09/19 0948   04/06/19 0400  azithromycin (ZITHROMAX) 500 mg in sodium chloride 0.9 % 250 mL IVPB  Status:  Discontinued     500 mg 250 mL/hr over 60 Minutes Intravenous Every 24 hours 04/06/19 0242 04/06/19 0725   04/06/19 0300  cefTRIAXone (ROCEPHIN) 1 g in sodium chloride 0.9 % 100 mL IVPB  Status:  Discontinued     1 g 200 mL/hr over 30 Minutes Intravenous Every 24 hours 04/06/19 0242 04/06/19 0725   04/05/19 2100  remdesivir 200 mg in sodium chloride 0.9% 250 mL IVPB     200 mg 580 mL/hr over 30 Minutes Intravenous Once 04/05/19 1919 04/05/19 2200   04/05/19 1918  remdesivir 200 mg in sodium chloride 0.9% 250 mL IVPB  Status:  Discontinued     200 mg 580 mL/hr over 30 Minutes Intravenous Once 04/05/19 1919 04/05/19 1919      Inpatient Medications  Scheduled Meds: . irbesartan  150 mg Oral Daily   And  . amLODipine  5 mg Oral Daily  . apixaban  5 mg Oral BID  . vitamin C  500 mg Oral Daily  . atorvastatin  10 mg Oral Daily  . insulin aspart  0-20 Units Subcutaneous TID WC  . insulin aspart  0-5 Units Subcutaneous QHS  . insulin aspart  4  Units Subcutaneous TID WC  . insulin glargine  12 Units Subcutaneous QHS  . Ipratropium-Albuterol  1 puff Inhalation Q6H  . latanoprost  1 drop Both Eyes QHS  . pantoprazole  40 mg Oral Daily  . pioglitazone  45 mg Oral Daily  . predniSONE  10 mg Oral Daily  . sodium chloride flush  10-40 mL Intracatheter Q12H  . zinc sulfate  220 mg Oral Daily   Continuous Infusions: PRN  Meds:.acetaminophen, chlorpheniramine-HYDROcodone, fluticasone, guaiFENesin-dextromethorphan, lip balm, ondansetron **OR** ondansetron (ZOFRAN) IV, sodium chloride flush   Time Spent in minutes  25  See all Orders from today for further details   Oren Binet M.D on 04/17/2019 at 1:42 PM  To page go to [email or web address] - use universal password  [facility] -  Office  [phone]    Objective:   Vitals:   04/16/19 2346 04/17/19 0416 04/17/19 0755 04/17/19 1117  BP: 113/61 122/65 129/66 (!) 111/53  Pulse: 62 70 69 79  Resp: 20 20 18 18   Temp: 97.8 F (36.6 C) 98.7 F (37.1 C) (!) 97.2 F (36.2 C) 98 F (36.7 C)  TempSrc: Oral Oral Oral Axillary  SpO2: 100% 98% 95% 90%  Weight:      Height:        Wt Readings from Last 3 Encounters:  04/06/19 78 kg  07/24/12 93 kg     Intake/Output Summary (Last 24 hours) at 04/17/2019 1342 Last data filed at 04/17/2019 1000 Gross per 24 hour  Intake 240 ml  Output 1350 ml  Net -1110 ml    Physical Exam Gen Exam:Alert awake-not in any distress HEENT:atraumatic, normocephalic Chest: B/L clear to auscultation anteriorly CVS:S1S2 regular Abdomen:soft non tender, non distended Extremities:no edema Neurology: Non focal Skin: Continues to have 2 nodular areas in the right neck-both are now ulcerated.  Somewhat tender.  But not very different from yesterday.  Has a large cold sore on the left lower lip.   Data Review:    CBC Recent Labs  Lab 04/11/19 0345 04/11/19 0345 04/12/19 0136 04/12/19 0136 04/13/19 0053 04/14/19 0440 04/15/19 1255 04/16/19 0755 04/17/19 0155  WBC 13.9*   < > 12.8*   < > 14.4* 13.2* 18.1* 14.2* 17.1*  HGB 15.3   < > 14.3   < > 14.4 14.1 16.3 15.4 14.7  HCT 46.3   < > 42.5   < > 42.7 42.2 48.1 46.4 45.1  PLT 340   < > 301   < > 274 261 279 239 228  MCV 90.3   < > 89.7   < > 89.7 89.8 90.4 91.3 91.7  MCH 29.8   < > 30.2   < > 30.3 30.0 30.6 30.3 29.9  MCHC 33.0   < > 33.6   < > 33.7 33.4  33.9 33.2 32.6  RDW 13.8   < > 13.8   < > 13.9 13.9 14.2 14.0 14.2  LYMPHSABS 0.6*  --  0.4*  --  0.4* 0.5*  --   --   --   MONOABS 0.4  --  0.5  --  0.3 0.4  --   --   --   EOSABS 0.0  --  0.0  --  0.1 0.0  --   --   --   BASOSABS 0.0  --  0.1  --  0.1 0.0  --   --   --    < > = values in this interval not displayed.    Chemistries  Recent Labs  Lab 04/13/19 0053 04/14/19 0440 04/15/19 1255 04/16/19 0755 04/17/19 0155  NA 132* 131* 133* 137 135  K 4.4 4.4 5.0 5.1 4.9  CL 98 96* 97* 100 99  CO2 23 26 24 28 26   GLUCOSE 162* 145* 181* 98 117*  BUN 34* 40* 33* 36* 32*  CREATININE 0.79 0.89 1.00 0.94 0.78  CALCIUM 8.2* 8.3* 8.6* 8.6* 8.5*  AST 26 19 21 20 23   ALT 20 19 16 18 18   ALKPHOS 90 95 99 85 96  BILITOT 1.0 1.1 1.5* 1.7* 0.8   ------------------------------------------------------------------------------------------------------------------ No results for input(s): CHOL, HDL, LDLCALC, TRIG, CHOLHDL, LDLDIRECT in the last 72 hours.  Lab Results  Component Value Date   HGBA1C 7.1 (H) 04/06/2019   ------------------------------------------------------------------------------------------------------------------ No results for input(s): TSH, T4TOTAL, T3FREE, THYROIDAB in the last 72 hours.  Invalid input(s): FREET3 ------------------------------------------------------------------------------------------------------------------ Recent Labs    04/16/19 0755 04/17/19 0155  FERRITIN 418* 417*    Coagulation profile No results for input(s): INR, PROTIME in the last 168 hours.  Recent Labs    04/16/19 0755 04/17/19 0155  DDIMER 13.41* 10.86*    Cardiac Enzymes No results for input(s): CKMB, TROPONINI, MYOGLOBIN in the last 168 hours.  Invalid input(s): CK ------------------------------------------------------------------------------------------------------------------    Component Value Date/Time   BNP 58.7 04/17/2019 0155    Micro Results No results  found for this or any previous visit (from the past 240 hour(s)).  Radiology Reports CT ANGIO CHEST PE W OR WO CONTRAST  Result Date: 04/09/2019 CLINICAL DATA:  COVID pneumonia, hypoxia, shortness of breath and elevated D-dimer. EXAM: CT ANGIOGRAPHY CHEST WITH CONTRAST TECHNIQUE: Multidetector CT imaging of the chest was performed using the standard protocol during bolus administration of intravenous contrast. Multiplanar CT image reconstructions and MIPs were obtained to evaluate the vascular anatomy. CONTRAST:  71mL OMNIPAQUE IOHEXOL 350 MG/ML SOLN COMPARISON:  Chest x-ray on 04/05/2019 FINDINGS: Cardiovascular: The pulmonary arteries are well opacified. There is no evidence pulmonary embolism. Central pulmonary arteries are normal in caliber. The heart size is normal. The thoracic aorta is normal in caliber. Trace amount of pericardial fluid. Calcified plaque is noted of the coronary arteries primarily in the distribution of the LAD. Mediastinum/Nodes: Mildly prominent right hilar nodal tissue measuring up to 1.5 cm. No evidence of enlarged mediastinal or axillary lymph nodes. Lungs/Pleura: Severe airspace disease is seen throughout all lobes of both lungs in a ground-glass pattern. There is an associated very small right pleural effusion. No pneumothorax. Upper Abdomen: No acute abnormality. Musculoskeletal: No chest wall abnormality. No acute or significant osseous findings. Review of the MIP images confirms the above findings. IMPRESSION: 1. No evidence of pulmonary embolism. 2. Severe airspace disease throughout all lobes of both lungs in a ground-glass pattern. Findings are consistent with severe bilateral COVID-19 pneumonia. 3. Mildly prominent right hilar nodal tissue measuring up to 1.5 cm in short axis. This is likely reactive. 4. Coronary artery disease with calcified plaque in the distribution of the LAD. Electronically Signed   By: Aletta Edouard M.D.   On: 04/09/2019 08:58   DG Chest Port 1  View  Result Date: 04/16/2019 CLINICAL DATA:  74 year old male with a history of COVID pneumonia EXAM: PORTABLE CHEST 1 VIEW COMPARISON:  CT 04/09/2019, plain film 04/05/2019 FINDINGS: Cardiomediastinal silhouette unchanged in size and contour. Similar appearance of low lung volumes and mixed airspace and interstitial opacities of the bilateral lungs. No pneumothorax or pleural effusion. IMPRESSION: Unchanged appearance of the lungs with mixed interstitial  and airspace disease bilateral compatible with multifocal pneumonia Electronically Signed   By: Corrie Mckusick D.O.   On: 04/16/2019 07:48   DG Chest Port 1 View  Result Date: 04/05/2019 CLINICAL DATA:  COVID positive low oxygen level EXAM: PORTABLE CHEST 1 VIEW COMPARISON:  None. FINDINGS: Bilateral ground-glass opacities and consolidations. No pleural effusion. Normal heart size. Aortic atherosclerosis. No pneumothorax. IMPRESSION: Bilateral ground-glass opacities and consolidations consistent with bilateral pneumonia. Electronically Signed   By: Donavan Foil M.D.   On: 04/05/2019 16:47   VAS [place] LOWER EXTREMITY VENOUS (DVT)  Result Date: 04/09/2019  Lower Venous Study Indications: Covid positive with worsening positive d-Dimer and hypoxia.  Anticoagulation: Lovenox. Comparison Study: No priors. Performing Technologist: Oda Cogan RDMS, RVT  Examination Guidelines: A complete evaluation includes B-mode imaging, spectral Doppler, color Doppler, and power Doppler as needed of all accessible portions of each vessel. Bilateral testing is considered an integral part of a complete examination. Limited examinations for reoccurring indications may be performed as noted.  +---------+---------------+---------+-----------+----------+--------------+ RIGHT    CompressibilityPhasicitySpontaneityPropertiesThrombus Aging +---------+---------------+---------+-----------+----------+--------------+ CFV      Full           Yes      Yes                                  +---------+---------------+---------+-----------+----------+--------------+ SFJ      Full                                                        +---------+---------------+---------+-----------+----------+--------------+ FV Prox  Full                                                        +---------+---------------+---------+-----------+----------+--------------+ FV Mid   Full                                                        +---------+---------------+---------+-----------+----------+--------------+ FV DistalFull                                                        +---------+---------------+---------+-----------+----------+--------------+ PFV      Full                                                        +---------+---------------+---------+-----------+----------+--------------+ POP      Full           Yes      Yes                                 +---------+---------------+---------+-----------+----------+--------------+  PTV      Full                                                        +---------+---------------+---------+-----------+----------+--------------+ PERO     Full                                                        +---------+---------------+---------+-----------+----------+--------------+   +---------+---------------+---------+-----------+----------+--------------+ LEFT     CompressibilityPhasicitySpontaneityPropertiesThrombus Aging +---------+---------------+---------+-----------+----------+--------------+ CFV      Full           Yes      Yes                                 +---------+---------------+---------+-----------+----------+--------------+ SFJ      Full                                                        +---------+---------------+---------+-----------+----------+--------------+ FV Prox  Full                                                         +---------+---------------+---------+-----------+----------+--------------+ FV Mid   Full                                                        +---------+---------------+---------+-----------+----------+--------------+ FV DistalFull                                                        +---------+---------------+---------+-----------+----------+--------------+ PFV      Full                                                        +---------+---------------+---------+-----------+----------+--------------+ POP      Full           Yes      Yes                                 +---------+---------------+---------+-----------+----------+--------------+ PTV      Full                                                        +---------+---------------+---------+-----------+----------+--------------+  PERO     Partial                                      Acute          +---------+---------------+---------+-----------+----------+--------------+ Soleal   Full                                         Acute          +---------+---------------+---------+-----------+----------+--------------+     Summary: Right: There is no evidence of deep vein thrombosis in the lower extremity. No cystic structure found in the popliteal fossa. Left: Findings consistent with acute deep vein thrombosis involving the left peroneal veins, and left soleal veins. Short segment of localized thrombus in the peroneal and soleal veins in the mid segment.  *See table(s) above for measurements and observations. Electronically signed by Ruta Hinds MD on 04/09/2019 at 2:56:25 PM.    Final

## 2019-04-17 NOTE — Progress Notes (Signed)
Notified spouse of progress all questions answered and this nurse's number shared for further communication.  

## 2019-04-17 NOTE — Progress Notes (Signed)
Occupational Therapy Treatment Patient Details Name: Dustin Diaz MRN: [id number] DOB: Aug 09, 1945 Today's Date: 04/17/2019    History of present illness 74 year old male admitted with Covid /Pneumonia. PMH includes DM, HTN, hyperlipidemia, polymyalgia rheumatica.   OT comments  Pt making progress in therapy demonstrating increased activity tolerance and requiring less supplemental oxygen as compared to previous sessions. Pt able to ambulate to/from bathroom and complete toileting and lower body dressing tasks. SpO2 decreased to 82% on 3L [place] with pt requiring ~1 min seated rest break to return to 93%. Continued education with pt on pursed lip breathing as pt tends to sharp shallow mouth breathe when short of breath. Trialed use of flutter valve to control respiration rate with no success. Educated and provided pt with handout regarding energy conservation strategies with fair understanding. No reports of dizziness throughout. OT will continue to follow acutely.    Follow Up Recommendations  Supervision - Intermittent;No OT follow up    Equipment Recommendations  None recommended by OT    Recommendations for Other Services      Precautions / Restrictions Precautions Precautions: Fall;Other (comment) Precaution Comments: monitor O2 Sats and check orthostatics Restrictions Weight Bearing Restrictions: No       Mobility Bed Mobility Overal bed mobility: Independent                Transfers Overall transfer level: Needs assistance Equipment used: Rolling walker (2 wheeled) Transfers: Sit to/from Stand Sit to Stand: Supervision              Balance Overall balance assessment: Needs assistance Sitting-balance support: Feet supported Sitting balance-Leahy Scale: Good       Standing balance-Leahy Scale: Fair                             ADL either performed or assessed with clinical judgement   ADL Overall ADL's : Needs assistance/impaired                      Lower Body Dressing: Supervision/safety;Sit to/from stand;Set up   Toilet Transfer: Set up;Supervision/safety;Regular Toilet;Ambulation   Toileting- Clothing Manipulation and Hygiene: Supervision/safety;Set up;Sit to/from stand       Functional mobility during ADLs: Min guard;Rolling walker General ADL Comments: Pt able to ambulate to/from bathroom with RW and min guard. Noted 0 instances of LOB.     Vision       Perception     Praxis      Cognition Arousal/Alertness: Lethargic Behavior During Therapy: Flat affect Overall Cognitive Status: Within Functional Limits for tasks assessed                                          Exercises Exercises: Other exercises Other Exercises Other Exercises: Encouraged pursed lip breathing throughout   Shoulder Instructions       General Comments SpO2 decreased to 82% on 3L [place] following activity with pt requiring ~1 min seated recovery to return back to 93%.     Pertinent Vitals/ Pain       Pain Assessment: No/denies pain  Home Living  Prior Functioning/Environment              Frequency           Progress Toward Goals  OT Goals(current goals can now be found in the care plan section)  Progress towards OT goals: Progressing toward goals  ADL Goals Additional ADL Goal #1: Pt will complete ADL task with SpO2 maintianing above 88 independently using pursed lip breathing techniques Additional ADL Goal #2: Pt will independently verbalize 3 energy conservation strategies  Plan Discharge plan remains appropriate    Co-evaluation                 AM-PAC OT "6 Clicks" Daily Activity     Outcome Measure   Help from another person eating meals?: None Help from another person taking care of personal grooming?: A Little Help from another person toileting, which includes using toliet, bedpan, or urinal?: A Little Help  from another person bathing (including washing, rinsing, drying)?: A Little Help from another person to put on and taking off regular upper body clothing?: A Little Help from another person to put on and taking off regular lower body clothing?: A Little 6 Click Score: 19    End of Session Equipment Utilized During Treatment: Oxygen;Rolling walker  OT Visit Diagnosis: Unsteadiness on feet (R26.81);Muscle weakness (generalized) (M62.81)   Activity Tolerance (Limited by SOB)   Patient Left in bed;with call bell/phone within reach   Nurse Communication Mobility status        Time: 1424-1450 OT Time Calculation (min): 26 min  Charges: OT General Charges $OT Visit: 1 Visit OT Treatments $Self Care/Home Management : 8-22 mins $Therapeutic Activity: 8-22 mins  Mauri Brooklyn OTR/L [phone]    Mauri Brooklyn 04/17/2019, 3:36 PM

## 2019-04-18 LAB — CBC
HCT: 42.1 % (ref 39.0–52.0)
Hemoglobin: 14.1 g/dL (ref 13.0–17.0)
MCH: 30.4 pg (ref 26.0–34.0)
MCHC: 33.5 g/dL (ref 30.0–36.0)
MCV: 90.7 fL (ref 80.0–100.0)
Platelets: 233 10*3/uL (ref 150–400)
RBC: 4.64 MIL/uL (ref 4.22–5.81)
RDW: 14.1 % (ref 11.5–15.5)
WBC: 14.4 10*3/uL — ABNORMAL HIGH (ref 4.0–10.5)
nRBC: 0 % (ref 0.0–0.2)

## 2019-04-18 LAB — COMPREHENSIVE METABOLIC PANEL WITH GFR
ALT: 15 U/L (ref 0–44)
AST: 18 U/L (ref 15–41)
Albumin: 2.9 g/dL — ABNORMAL LOW (ref 3.5–5.0)
Alkaline Phosphatase: 78 U/L (ref 38–126)
Anion gap: 8 (ref 5–15)
BUN: 30 mg/dL — ABNORMAL HIGH (ref 8–23)
CO2: 26 mmol/L (ref 22–32)
Calcium: 8.4 mg/dL — ABNORMAL LOW (ref 8.9–10.3)
Chloride: 102 mmol/L (ref 98–111)
Creatinine, Ser: 0.83 mg/dL (ref 0.61–1.24)
GFR calc Af Amer: >60 mL/min
GFR calc non Af Amer: >60 mL/min
Glucose, Bld: 110 mg/dL — ABNORMAL HIGH (ref 70–99)
Potassium: 4.6 mmol/L (ref 3.5–5.1)
Sodium: 136 mmol/L (ref 135–145)
Total Bilirubin: 0.9 mg/dL (ref 0.3–1.2)
Total Protein: 5.5 g/dL — ABNORMAL LOW (ref 6.5–8.1)

## 2019-04-18 LAB — GLUCOSE, CAPILLARY
Glucose-Capillary: 280 mg/dL — ABNORMAL HIGH (ref 70–99)
Glucose-Capillary: 130 mg/dL — ABNORMAL HIGH (ref 70–99)
Glucose-Capillary: 223 mg/dL — ABNORMAL HIGH (ref 70–99)
Glucose-Capillary: 220 mg/dL — ABNORMAL HIGH (ref 70–99)

## 2019-04-18 LAB — FERRITIN: Ferritin: 382 ng/mL — ABNORMAL HIGH (ref 24–336)

## 2019-04-18 LAB — D-DIMER, QUANTITATIVE: D-Dimer, Quant: 9.83 ug/mL-FEU — ABNORMAL HIGH (ref 0.00–0.50)

## 2019-04-18 LAB — C-REACTIVE PROTEIN: CRP: 0.6 mg/dL (ref <1.0)

## 2019-04-18 LAB — CRYPTOCOCCAL ANTIGEN: Crypto Ag: NEGATIVE

## 2019-04-18 LAB — LACTATE DEHYDROGENASE: LDH: 323 U/L — ABNORMAL HIGH (ref 98–192)

## 2019-04-18 MED ORDER — ACETAMINOPHEN 325 MG PO TABS: 650.0000 mg | ORAL_TABLET | Freq: Every day | ORAL | Status: DC | PRN

## 2019-04-18 MED ORDER — SODIUM CHLORIDE 0.9 % IV BOLUS FOR AMBISOME
500.0000 mL | INTRAVENOUS | Status: DC
  Administered 2019-04-18 – 2019-04-20 (×3): 500 mL via INTRAVENOUS

## 2019-04-18 MED ORDER — DIPHENHYDRAMINE HCL 25 MG PO CAPS: 25.0000 mg | ORAL_CAPSULE | Freq: Every day | ORAL | Status: DC | PRN

## 2019-04-18 MED ORDER — LIDOCAINE-EPINEPHRINE (PF) 2 %-1:200000 IJ SOLN
20.0000 mL | Freq: Once | INTRAMUSCULAR | Status: AC
  Administered 2019-04-19: 20 mL
  Filled 2019-04-18: qty 20

## 2019-04-18 MED ORDER — DIPHENHYDRAMINE HCL 50 MG/ML IJ SOLN: 25.0000 mg | Freq: Every day | INTRAMUSCULAR | Status: DC | PRN

## 2019-04-18 MED ORDER — DEXTROSE 5 % IV SOLN
3.0000 mg/kg | INTRAVENOUS | Status: DC
  Administered 2019-04-18 – 2019-04-22 (×5): 230 mg via INTRAVENOUS
  Filled 2019-04-18 (×6): qty 57.5

## 2019-04-18 MED ORDER — SODIUM CHLORIDE 0.9 % IV BOLUS FOR AMBISOME
500.0000 mL | INTRAVENOUS | Status: DC
  Administered 2019-04-18 – 2019-04-19 (×2): 500 mL via INTRAVENOUS

## 2019-04-18 MED ORDER — PREDNISONE 10 MG PO TABS
5.0000 mg | ORAL_TABLET | Freq: Every day | ORAL | Status: DC
  Administered 2019-04-19: 5 mg via ORAL
  Filled 2019-04-18: qty 1

## 2019-04-18 MED ORDER — DEXTROSE 5% FOR FLUSHING BEFORE AND AFTER AMBISOME
10.0000 mL | INTRAVENOUS | Status: DC
  Administered 2019-04-18 – 2019-04-20 (×4): 10 mL via INTRAVENOUS
  Filled 2019-04-18: qty 10
  Filled 2019-04-18: qty 50
  Filled 2019-04-18: qty 10
  Filled 2019-04-18: qty 50

## 2019-04-18 MED ORDER — DEXTROSE 5% FOR FLUSHING BEFORE AND AFTER AMBISOME
10.0000 mL | INTRAVENOUS | Status: DC
  Administered 2019-04-18 – 2019-04-22 (×6): 10 mL via INTRAVENOUS
  Filled 2019-04-18: qty 10
  Filled 2019-04-18: qty 50
  Filled 2019-04-18 (×3): qty 10
  Filled 2019-04-18: qty 50

## 2019-04-18 MED ORDER — MEPERIDINE HCL 25 MG/ML IJ SOLN: 25.0000 mg | INTRAMUSCULAR | Status: DC | PRN

## 2019-04-18 NOTE — Progress Notes (Signed)
Notified spouse of progress all questions answered and this nurse's number shared for further communication.  

## 2019-04-18 NOTE — Progress Notes (Signed)
Physical Therapy Treatment Patient Details Name: Dustin Diaz MRN: [id number] DOB: [birth year] Today's Date: 04/18/2019    History of Present Illness 74 year old male admitted with Covid /Pneumonia. PMH includes DM, HTN, hyperlipidemia, polymyalgia rheumatica.    PT Comments    Pt did well with mobility this pm, stated some fatigued but agreeable to tx. Mod I with bed mob, stand by assist with line management for transfers from bed/commode, ambulated in room approx 47ft x 2 with min guard assist. Pt was on 2.5L/min vai HFNC at rest and sats in high 90s, with ambulation and activity desat to min of 83%. Pt was able to sit unsupported EOB approx 20-25 mins.     Follow Up Recommendations  Home health PT;Supervision - Intermittent     Equipment Recommendations  None recommended by PT    Recommendations for Other Services       Precautions / Restrictions Precautions Precautions: Fall Restrictions Weight Bearing Restrictions: No    Mobility  Bed Mobility Overal bed mobility: Modified Independent                Transfers Overall transfer level: Needs assistance Equipment used: None Transfers: Sit to/from Stand Sit to Stand: Supervision            Ambulation/Gait Ambulation/Gait assistance: Min guard Gait Distance (Feet): 22 Feet Assistive device: None;1 person hand held assist Gait Pattern/deviations: Step-through pattern Gait velocity: decr   General Gait Details: ambulated from bed to rest room able to use rest room and ambulate back to bed side with min guard assist   Stairs             Wheelchair Mobility    Modified Rankin (Stroke Patients Only)       Balance Overall balance assessment: Needs assistance Sitting-balance support: Feet supported Sitting balance-Leahy Scale: Good     Standing balance support: During functional activity Standing balance-Leahy Scale: Fair                              Cognition  Arousal/Alertness: Lethargic Behavior During Therapy: Flat affect Overall Cognitive Status: Within Functional Limits for tasks assessed                                        Exercises      General Comments        Pertinent Vitals/Pain Pain Assessment: No/denies pain(with exceptionon of lesion on lip)    Home Living                      Prior Function            PT Goals (current goals can now be found in the care plan section) Acute Rehab PT Goals Patient Stated Goal: c/o fatigue but no dizziness PT Goal Formulation: With patient Time For Goal Achievement: 04/25/19 Potential to Achieve Goals: Good Progress towards PT goals: Progressing toward goals    Frequency    Min 3X/week      PT Plan Current plan remains appropriate    Co-evaluation              AM-PAC PT "6 Clicks" Mobility   Outcome Measure  Help needed turning from your back to your side while in a flat bed without using bedrails?: None Help needed moving from lying on your  back to sitting on the side of a flat bed without using bedrails?: None Help needed moving to and from a bed to a chair (including a wheelchair)?: A Little Help needed standing up from a chair using your arms (e.g., wheelchair or bedside chair)?: A Little Help needed to walk in hospital room?: A Little Help needed climbing 3-5 steps with a railing? : A Lot 6 Click Score: 19    End of Session Equipment Utilized During Treatment: Oxygen Activity Tolerance: Patient tolerated treatment well;Patient limited by fatigue;Patient limited by lethargy;Treatment limited secondary to medical complications (Comment) Patient left: in bed;with call bell/phone within reach Nurse Communication: Mobility status PT Visit Diagnosis: Unsteadiness on feet (R26.81);Other abnormalities of gait and mobility (R26.89);Muscle weakness (generalized) (M62.81)     Time: [id number] PT Time Calculation (min) (ACUTE ONLY): 34  min  Charges:  $Therapeutic Activity: 8-22 mins $Neuromuscular Re-education: 8-22 mins                     Horald Chestnut, PT    Delford Field 04/18/2019, 3:48 PM

## 2019-04-18 NOTE — Consult Note (Signed)
[facility] for Infectious Disease  Total days of antibiotics 7 with 2 days/cefepime plus azithro               Reason for Consult: skin lesions - remote e-consultation  Referring Physician: ghimire  Principal Problem:   Pneumonia due to COVID-19 virus Active Problems:   Hypertension   Diabetes ([place])   High cholesterol   Polymyalgia rheumatica (HCC)    HPI: Dustin Diaz is a 74 y.o. male with HTN, DM and on chronic prednisone for PMR who was admitted on 1/14 for worsening shortness of breath, cough, x 8 days since diagnosis of covid-19. He was found hypoxic at 80% RA requiring 8LNC, with imaging consistent with viral pneumonia. He was admitted for mod-severe covid-19 illness. He was started on steroids, remdesivir, as well as tociclizimab. He finished 5 day course of remdesivir, but still hypoxic and continued on steroids. In the last 5 days he has had nodules on his neck that have now ulcerated. In addition, has a lip lesion that has ulcerated and crusted over. He has had small increase in leukocytosis though now down back to pred 10mg  daily. procalcitonin remains negative. Dr Ghimire's images of skin lesions show heaped up edges with central necrosis. Given that he was on previous chronic steroids, and given further immune suppression for treatment of covid-19, there is concern to whether these lesions are evidence of invasive fungal infection. cxr 1/25 sill shows patchy bilateral infiltrates. 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 Has pet doges but no farm animals. No recent travel  Past Medical History:  Diagnosis Date  . Diabetes mellitus without complication ([place])   . High cholesterol   . Hyperlipemia   . Hypertension   . Polymyalgia rheumatica (HCC)     Allergies:  Allergies  Allergen Reactions  . Codeine      MEDICATIONS: . apixaban  5 mg Oral BID  . vitamin C  500 mg Oral Daily  . atorvastatin  10 mg Oral Daily  .  dextrose  10 mL Intravenous Q24H  . dextrose  10 mL Intravenous Q24H  . insulin aspart  0-15 Units Subcutaneous TID WC  . insulin aspart  0-5 Units Subcutaneous QHS  . insulin aspart  4 Units Subcutaneous TID WC  . insulin glargine  6 Units Subcutaneous QHS  . Ipratropium-Albuterol  1 puff Inhalation Q6H  . latanoprost  1 drop Both Eyes QHS  . pantoprazole  40 mg Oral Daily  . [START ON 04/19/2019] predniSONE  5 mg Oral Daily  . sodium chloride  500 mL Intravenous Q24H  . sodium chloride  500 mL Intravenous Q24H  . sodium chloride flush  10-40 mL Intracatheter Q12H  . zinc sulfate  220 mg Oral Daily    Social History   Tobacco Use  . Smoking status: Never Smoker  . Smokeless tobacco: Never Used  Substance Use Topics  . Alcohol use: Yes    Comment: occ: 1 beer monthly  . Drug use: No    Family History  Problem Relation Age of Onset  . Heart failure Father     Review of Systems -    OBJECTIVE: Temp:  [97.7 F (36.5 C)-99 F (37.2 C)] 98.4 F (36.9 C) (01/27 0742) Pulse Rate:  [68-88] 68 (01/27 0742) Resp:  [16-18] 18 (01/27 1600) BP: (78-145)/(47-76) 145/67 (01/27 0742) SpO2:  [85 %-96 %] 95 % (01/27 0742) No exam  LABS:  Results for orders placed or performed during the hospital encounter of 04/05/19 (from the past 48 hour(s))  Glucose, capillary     Status: None   Collection Time: 04/16/19  7:49 PM  Result Value Ref Range   Glucose-Capillary 77 70 - 99 mg/dL  C-reactive protein     Status: None   Collection Time: 04/17/19  1:55 AM  Result Value Ref Range   CRP 0.6 <1.0 mg/dL    Comment: Performed at [facility], [place] [address], [place], [place] [address]  Comprehensive metabolic panel     Status: Abnormal   Collection Time: 04/17/19  1:55 AM  Result Value Ref Range   Sodium 135 135 - 145 mmol/L   Potassium 4.9 3.5 - 5.1 mmol/L   Chloride 99 98 - 111 mmol/L   CO2 26 22 - 32 mmol/L   Glucose, Bld 117 (H) 70 - 99 mg/dL   BUN 32 (H) 8 -  23 mg/dL   Creatinine, Ser 0.78 0.61 - 1.24 mg/dL   Calcium 8.5 (L) 8.9 - 10.3 mg/dL   Total Protein 5.9 (L) 6.5 - 8.1 g/dL   Albumin 3.1 (L) 3.5 - 5.0 g/dL   AST 23 15 - 41 U/L   ALT 18 0 - 44 U/L   Alkaline Phosphatase 96 38 - 126 U/L   Total Bilirubin 0.8 0.3 - 1.2 mg/dL   GFR calc non Af Amer >60 >60 mL/min   GFR calc Af Amer >60 >60 mL/min   Anion gap 10 5 - 15    Comment: Performed at [facility], [place] [address], [place], [place] [address]  CBC     Status: Abnormal   Collection Time: 04/17/19  1:55 AM  Result Value Ref Range   WBC 17.1 (H) 4.0 - 10.5 K/uL   RBC 4.92 4.22 - 5.81 MIL/uL   Hemoglobin 14.7 13.0 - 17.0 g/dL   HCT 45.1 39.0 - 52.0 %   MCV 91.7 80.0 - 100.0 fL   MCH 29.9 26.0 - 34.0 pg   MCHC 32.6 30.0 - 36.0 g/dL   RDW 14.2 11.5 - 15.5 %   Platelets 228 150 - 400 K/uL   nRBC 0.0 0.0 - 0.2 %    Comment: Performed at [facility], [place] [address] [place], [place] [address]  Ferritin     Status: Abnormal   Collection Time: 04/17/19  1:55 AM  Result Value Ref Range   Ferritin 417 (H) 24 - 336 ng/mL    Comment: Performed at [facility], [place] [address] [place], [place] [address]  D-dimer, quantitative (not at [facility])     Status: Abnormal   Collection Time: 04/17/19  1:55 AM  Result Value Ref Range   D-Dimer, Quant 10.86 (H) 0.00 - 0.50 ug/mL-FEU    Comment: (NOTE) At the manufacturer cut-off of 0.50 ug/mL FEU, this assay has been documented to exclude PE with a sensitivity and negative predictive value of 97 to 99%.  At this time, this assay has not been approved by the FDA to exclude DVT/VTE. Results should be correlated with clinical presentation. Performed at [facility], [place] [address], [place], [place] [address]   Brain natriuretic peptide     Status: None   Collection Time: 04/17/19  1:55 AM  Result Value Ref Range   B Natriuretic Peptide 58.7 0.0 - 100.0 pg/mL    Comment:  Performed at [facility], [place] [address]  [place], [place] [address]  Glucose, capillary     Status: None   Collection Time: 04/17/19  7:59 AM  Result Value Ref Range   Glucose-Capillary 90 70 - 99 mg/dL  Glucose, capillary     Status: Abnormal   Collection Time: 04/17/19 11:19 AM  Result Value Ref Range   Glucose-Capillary 224 (H) 70 - 99 mg/dL  Glucose, capillary     Status: Abnormal   Collection Time: 04/17/19  3:49 PM  Result Value Ref Range   Glucose-Capillary 282 (H) 70 - 99 mg/dL  MRSA PCR Screening     Status: None   Collection Time: 04/17/19  4:17 PM   Specimen: Nasal Mucosa; Nasopharyngeal  Result Value Ref Range   MRSA by PCR NEGATIVE NEGATIVE    Comment:        The GeneXpert MRSA Assay (FDA approved for NASAL specimens only), is one component of a comprehensive MRSA colonization surveillance program. It is not intended to diagnose MRSA infection nor to guide or monitor treatment for MRSA infections. Performed at [facility], [place] [address], [place], [place] [address]   Glucose, capillary     Status: Abnormal   Collection Time: 04/17/19  8:18 PM  Result Value Ref Range   Glucose-Capillary 185 (H) 70 - 99 mg/dL  C-reactive protein     Status: None   Collection Time: 04/18/19  2:38 AM  Result Value Ref Range   CRP 0.6 <1.0 mg/dL    Comment: Performed at [facility], [place] [address] [place], [place] [address]  Comprehensive metabolic panel     Status: Abnormal   Collection Time: 04/18/19  2:38 AM  Result Value Ref Range   Sodium 136 135 - 145 mmol/L   Potassium 4.6 3.5 - 5.1 mmol/L   Chloride 102 98 - 111 mmol/L   CO2 26 22 - 32 mmol/L   Glucose, Bld 110 (H) 70 - 99 mg/dL   BUN 30 (H) 8 - 23 mg/dL   Creatinine, Ser 0.83 0.61 - 1.24 mg/dL   Calcium 8.4 (L) 8.9 - 10.3 mg/dL   Total Protein 5.5 (L) 6.5 - 8.1 g/dL   Albumin 2.9 (L) 3.5 - 5.0 g/dL   AST 18 15 - 41 U/L   ALT 15 0 - 44 U/L   Alkaline  Phosphatase 78 38 - 126 U/L   Total Bilirubin 0.9 0.3 - 1.2 mg/dL   GFR calc non Af Amer >60 >60 mL/min   GFR calc Af Amer >60 >60 mL/min   Anion gap 8 5 - 15    Comment: Performed at [facility], [place] [address], [place], [place] [address]  CBC     Status: Abnormal   Collection Time: 04/18/19  2:38 AM  Result Value Ref Range   WBC 14.4 (H) 4.0 - 10.5 K/uL   RBC 4.64 4.22 - 5.81 MIL/uL   Hemoglobin 14.1 13.0 - 17.0 g/dL   HCT 42.1 39.0 - 52.0 %   MCV 90.7 80.0 - 100.0 fL   MCH 30.4 26.0 - 34.0 pg   MCHC 33.5 30.0 - 36.0 g/dL   RDW 14.1 11.5 - 15.5 %   Platelets 233 150 - 400 K/uL   nRBC 0.0 0.0 - 0.2 %    Comment: Performed at [facility], [place] [address], [place], [place] [address]  Ferritin     Status: Abnormal   Collection Time: 04/18/19  2:38 AM  Result Value Ref Range  Ferritin 382 (H) 24 - 336 ng/mL    Comment: Performed at [facility], [place] [address] [place], [place] [address]  D-dimer, quantitative (not at [facility])     Status: Abnormal   Collection Time: 04/18/19  2:38 AM  Result Value Ref Range   D-Dimer, Quant 9.83 (H) 0.00 - 0.50 ug/mL-FEU    Comment: (NOTE) At the manufacturer cut-off of 0.50 ug/mL FEU, this assay has been documented to exclude PE with a sensitivity and negative predictive value of 97 to 99%.  At this time, this assay has not been approved by the FDA to exclude DVT/VTE. Results should be correlated with clinical presentation. Performed at [facility], [place] [address] [place], [place] [address]   Glucose, capillary     Status: Abnormal   Collection Time: 04/18/19  7:40 AM  Result Value Ref Range   Glucose-Capillary 280 (H) 70 - 99 mg/dL  Glucose, capillary     Status: Abnormal   Collection Time: 04/18/19 11:38 AM  Result Value Ref Range   Glucose-Capillary 130 (H) 70 - 99 mg/dL  Glucose, capillary     Status: Abnormal   Collection Time: 04/18/19  3:44 PM  Result  Value Ref Range   Glucose-Capillary 223 (H) 70 - 99 mg/dL    MICRO: 1/27 blood cx- fungal 1/14 blood cx NGTD  Assessment/Plan:  Skin lesions of acute onset in immunocompromised host has broad differential concern for invasive fungal infection such as histoplasma, coccidiodomycosis, blastomycosis, cryptococcal disease vs. Less likely  Bacillary angiomatosis. Vs. Possible NTM cutaneous lesions  - will check serum and urine antigen studies for histo, blasto, plus serum crypto ag - b-d glucan, LDH - recommend to get tissue biopsy to be sent for path, aerogic/anaerobic culture, fungal culture and AFB culture - will empirically start liposomal ampho -recommend CT abdomen to see if any organomegaly or further lymphadenopathy - recommend imaging of brain to see if any CNS involvement - recommend to d/c cefepime  Dustin Diaz for Infectious Diseases [phone]

## 2019-04-18 NOTE — Progress Notes (Signed)
Pharmacy Antibiotic Note  Dustin Diaz is a 73 y.o. 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 Noted risk factors of chronic prednisone/Actemra doses 1/15 and 1/16.    Pharmacy has been consulted for amphotericin B dosing. Current Scr is 0.83.   Liposomal amphotericin B requires pre and post hydration with normal saline to preserve kidney function. A 500 mL saline bolus has been scheduled for 1 hour prior to amphotericin B infusion and immediately after infusion ends. The line will be flushed with dextrose in between saline boluses and amphotericin B dose as amphotericin B is incompatible with normal saline. This has been discussed with MR. Holden's RN.   Potassium and Magnesium will be monitored daily and replaced by pharmacy per protocol.  Plan: Liposomal amphotericin B 3 mg/kg (230 mg) every 24 hours  Normal saline 500 mL 1 hour prior to dose and immediately after Dextrose flushes between saline boluses and amphotericin B Daily K and Mg  Pharmacy will replace per protocol Patient RN has been educated on administration of amphotericin B    Height: 5\' 10"  (177.8 cm) Weight: 171 lb 15.3 oz (78 kg) IBW/kg (Calculated) : 73  Temp (24hrs), Avg:98.3 F (36.8 C), Min:97.7 F (36.5 C), Max:99 F (37.2 C)  Recent Labs  Lab 04/14/19 0440 04/15/19 1255 04/16/19 0755 04/17/19 0155 04/18/19 0238  WBC 13.2* 18.1* 14.2* 17.1* 14.4*  CREATININE 0.89 1.00 0.94 0.78 0.83    Estimated Creatinine Clearance: 83.1 mL/min (by C-G formula based on SCr of 0.83 mg/dL).    Allergies  Allergen Reactions  . Codeine       Thank you for allowing pharmacy to be a part of this patient's care.  Jimmy Footman, PharmD, BCPS, BCIDP Infectious Diseases Clinical Pharmacist Phone: [phone] 04/18/2019 2:32 PM

## 2019-04-18 NOTE — Progress Notes (Signed)
PROGRESS NOTE                                                                                                                                                                                                             Patient Demographics:    Dustin Diaz, is a 74 y.o. male, DOB - 1945/04/26, [id number]  Outpatient Primary MD for the patient is Lavone Orn, MD   Admit date - 04/05/2019   LOS - 47  Chief Complaint  Patient presents with  . Covid positive  . low O2       Brief Narrative: Patient is a 74 y.o. 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  Hospital course complicated by slow improvement in hypoxia and lower extremity DVT.  See below for further details.   Subjective:   No major events overnight-remains on 2-3 L-but desaturates easily with minimal movement.   Assessment  & Plan :   Acute Hypoxic Resp Failure due to Covid 19 Viral pneumonia: Relatively stable over the past few days-his hypoxia continues to wax and wane-at rest requiring around 2-3 L-but gets very dyspneic with minimal activity-and at times requires 10 L of oxygen.  He is nodular lesions in his neck have now started to cavitate-discussed with infectious disease-concern for fungal infection-recommendations are to start amphotericin-we will attempt a punch biopsy-which we will send out for cytology and cultures.  Check cryptococcal antigen, histoplasma urine antigen-and fungal blood cultures.  Do not think that patient has a superimposed bacterial infection at this point-we will go ahead and stop cefepime.  Fever: afebrile  O2 requirements:  SpO2: 95 % O2 Flow Rate (L/min): 3 L/min FiO2 (%): 100 %   COVID-19 Labs: Recent Labs    04/16/19 0755 04/17/19 0155 04/18/19 0238   DDIMER 13.41* 10.86* 9.83*  FERRITIN 418* 417* 382*  CRP 0.6 0.6 0.6       Component Value Date/Time   BNP 58.7 04/17/2019 0155    Recent Labs  Lab 04/14/19 0440 04/16/19 0755  PROCALCITON <0.10 <0.10    No results found for: SARSCOV2NAA   COVID-19 Medications: Decadron: 1/14>>1/23 Remdesivir: 1/14>> 1/18 Actemra: 1/15 x 1, 1/16 x 1  Antibiotics: Vancomycin  1/26>> 1/27 Cefepime 1/26>> 1/27  Antifungal agents: Amphotericin B 1/27>>  Prone/Incentive Spirometry: encouraged  incentive spirometry use 3-4/hour.  DVT Prophylaxis  : Eliquis  Left lower extremity DVT: Continue Eliquis-CTA chest on 1/18 without PE.  DM-2: CBG stable-continue Lantus 6 units and NovoLog.  Follow.  CBG (last 3)  Recent Labs    04/17/19 1119 04/17/19 1549 04/17/19 2018  GLUCAP 224* 282* 185*    HTN: BP controlled-have discontinued all antihypertensives on 1/26-due to dizziness upon standing.  Dyslipidemia: Continue statin  History of PMR: Continue prednisone-dose tapered down to 5 mg.  Right hilar lymphadenopathy: Likely reactive-repeat imaging in 3 to 6 months as outpatient.  Nodular skin lesion in his right neck area: See pictures below-now starting to ulcerate in the middle-and also has developed a similar area in the posterior scalp.  Will attempt a punch biopsy today-we will need to send for pathology/cytology-fungal, AFB cultures, aerobic/anaerobic cultures.  Discussed with ID-empirically starting amphotericin B to cover for fungal etiology.  Nutrition Problem: Nutrition Problem: Increased nutrient needs Etiology: catabolic [id number] PNA) Signs/Symptoms: estimated needs Interventions: Ensure Enlive (each supplement provides 350kcal and 20 grams of protein), Magic cup, Hormel Shake   Consults  :  None  Procedures  :  Non  ABG: No results found for: PHART, PCO2ART, PO2ART, HCO3, TCO2, ACIDBASEDEF, O2SAT  Vent Settings: N/A   Condition - Extremely  Guarded  Family Communication  :  Spouse updated over the phone 1/27  Code Status :  Full Code  Diet :  Diet Order            Diet heart healthy/carb modified Room service appropriate? Yes; Fluid consistency: Thin  Diet effective now               Disposition Plan  :  Remain hospitalized-Home when hypoxia improves.  Barriers to discharge: Hypoxia requiring O2 supplementation  Antimicorbials  :    Anti-infectives (From admission, onward)   Start     Dose/Rate Route Frequency Ordered Stop   04/18/19 1000  vancomycin (VANCOCIN) IVPB 1000 mg/200 mL premix     1,000 mg 200 mL/hr over 60 Minutes Intravenous Every 12 hours 04/17/19 1400     04/17/19 1500  ceFEPIme (MAXIPIME) 2 g in sodium chloride 0.9 % 100 mL IVPB     2 g 200 mL/hr over 30 Minutes Intravenous Every 8 hours 04/17/19 1359     04/17/19 1430  vancomycin (VANCOREADY) IVPB 1500 mg/300 mL     1,500 mg 150 mL/hr over 120 Minutes Intravenous  Once 04/17/19 1400 04/17/19 1825   04/06/19 1000  remdesivir 100 mg in sodium chloride 0.9 % 100 mL IVPB  Status:  Discontinued     100 mg 200 mL/hr over 30 Minutes Intravenous Daily 04/05/19 1919 04/05/19 1919   04/06/19 1000  remdesivir 100 mg in sodium chloride 0.9 % 100 mL IVPB     100 mg 200 mL/hr over 30 Minutes Intravenous Daily 04/05/19 1919 04/09/19 0948   04/06/19 0400  azithromycin (ZITHROMAX) 500 mg in sodium chloride 0.9 % 250 mL IVPB  Status:  Discontinued     500 mg 250 mL/hr over 60 Minutes Intravenous Every 24 hours 04/06/19 0242 04/06/19 0725   04/06/19 0300  cefTRIAXone (ROCEPHIN) 1 g in sodium chloride 0.9 % 100 mL IVPB  Status:  Discontinued     1 g 200 mL/hr over 30 Minutes Intravenous Every 24 hours 04/06/19 0242 04/06/19 0725   04/05/19 2100  remdesivir 200 mg in  sodium chloride 0.9% 250 mL IVPB     200 mg 580 mL/hr over 30 Minutes Intravenous Once 04/05/19 1919 04/05/19 2200   04/05/19 1918  remdesivir 200 mg in sodium chloride 0.9% 250 mL IVPB  Status:   Discontinued     200 mg 580 mL/hr over 30 Minutes Intravenous Once 04/05/19 1919 04/05/19 1919      Inpatient Medications  Scheduled Meds: . apixaban  5 mg Oral BID  . vitamin C  500 mg Oral Daily  . atorvastatin  10 mg Oral Daily  . insulin aspart  0-15 Units Subcutaneous TID WC  . insulin aspart  0-5 Units Subcutaneous QHS  . insulin aspart  4 Units Subcutaneous TID WC  . insulin glargine  6 Units Subcutaneous QHS  . Ipratropium-Albuterol  1 puff Inhalation Q6H  . latanoprost  1 drop Both Eyes QHS  . pantoprazole  40 mg Oral Daily  . predniSONE  10 mg Oral Daily  . sodium chloride flush  10-40 mL Intracatheter Q12H  . zinc sulfate  220 mg Oral Daily   Continuous Infusions: . ceFEPime (MAXIPIME) IV 2 g (04/18/19 0649)  . vancomycin 1,000 mg (04/18/19 0826)   PRN Meds:.acetaminophen, ALPRAZolam, chlorpheniramine-HYDROcodone, fluticasone, guaiFENesin-dextromethorphan, lip balm, ondansetron **OR** ondansetron (ZOFRAN) IV, sodium chloride flush   Time Spent in minutes  25  See all Orders from today for further details   Oren Binet M.D on 04/18/2019 at 11:35 AM  To page go to [email or web address] - use universal password  [facility] -  Office  [phone]    Objective:   Vitals:   04/18/19 0003 04/18/19 0004 04/18/19 0343 04/18/19 0742  BP:   134/62 (!) 145/67  Pulse: 78 82 69 68  Resp:    17  Temp:   97.7 F (36.5 C) 98.4 F (36.9 C)  TempSrc:   Oral Oral  SpO2: (!) 87% 91%  95%  Weight:      Height:        Wt Readings from Last 3 Encounters:  04/06/19 78 kg  07/24/12 93 kg     Intake/Output Summary (Last 24 hours) at 04/18/2019 1135 Last data filed at 04/18/2019 0742 Gross per 24 hour  Intake 600.1 ml  Output 3000 ml  Net -2399.9 ml    Physical Exam Gen Exam:Alert awake-not in any distress HEENT:atraumatic, normocephalic Chest: B/L clear to auscultation anteriorly CVS:S1S2 regular Abdomen:soft non tender, non distended Extremities:no  edema Neurology: Non focal Skin: See pictures below                      Data Review:    CBC Recent Labs  Lab 04/12/19 0136 04/12/19 0136 04/13/19 0053 04/13/19 0053 04/14/19 0440 04/15/19 1255 04/16/19 0755 04/17/19 0155 04/18/19 0238  WBC 12.8*   < > 14.4*   < > 13.2* 18.1* 14.2* 17.1* 14.4*  HGB 14.3   < > 14.4   < > 14.1 16.3 15.4 14.7 14.1  HCT 42.5   < > 42.7   < > 42.2 48.1 46.4 45.1 42.1  PLT 301   < > 274   < > 261 279 239 228 233  MCV 89.7   < > 89.7   < > 89.8 90.4 91.3 91.7 90.7  MCH 30.2   < > 30.3   < > 30.0 30.6 30.3 29.9 30.4  MCHC 33.6   < > 33.7   < > 33.4 33.9 33.2 32.6 33.5  RDW 13.8   < >  13.9   < > 13.9 14.2 14.0 14.2 14.1  LYMPHSABS 0.4*  --  0.4*  --  0.5*  --   --   --   --   MONOABS 0.5  --  0.3  --  0.4  --   --   --   --   EOSABS 0.0  --  0.1  --  0.0  --   --   --   --   BASOSABS 0.1  --  0.1  --  0.0  --   --   --   --    < > = values in this interval not displayed.    Chemistries  Recent Labs  Lab 04/14/19 0440 04/15/19 1255 04/16/19 0755 04/17/19 0155 04/18/19 0238  NA 131* 133* 137 135 136  K 4.4 5.0 5.1 4.9 4.6  CL 96* 97* 100 99 102  CO2 26 24 28 26 26   GLUCOSE 145* 181* 98 117* 110*  BUN 40* 33* 36* 32* 30*  CREATININE 0.89 1.00 0.94 0.78 0.83  CALCIUM 8.3* 8.6* 8.6* 8.5* 8.4*  AST 19 21 20 23 18   ALT 19 16 18 18 15   ALKPHOS 95 99 85 96 78  BILITOT 1.1 1.5* 1.7* 0.8 0.9   ------------------------------------------------------------------------------------------------------------------ No results for input(s): CHOL, HDL, LDLCALC, TRIG, CHOLHDL, LDLDIRECT in the last 72 hours.  Lab Results  Component Value Date   HGBA1C 7.1 (H) 04/06/2019   ------------------------------------------------------------------------------------------------------------------ No results for input(s): TSH, T4TOTAL, T3FREE, THYROIDAB in the last 72 hours.  Invalid input(s):  FREET3 ------------------------------------------------------------------------------------------------------------------ Recent Labs    04/17/19 0155 04/18/19 0238  FERRITIN 417* 382*    Coagulation profile No results for input(s): INR, PROTIME in the last 168 hours.  Recent Labs    04/17/19 0155 04/18/19 0238  DDIMER 10.86* 9.83*    Cardiac Enzymes No results for input(s): CKMB, TROPONINI, MYOGLOBIN in the last 168 hours.  Invalid input(s): CK ------------------------------------------------------------------------------------------------------------------    Component Value Date/Time   BNP 58.7 04/17/2019 0155    Micro Results Recent Results (from the past 240 hour(s))  MRSA PCR Screening     Status: None   Collection Time: 04/17/19  4:17 PM   Specimen: Nasal Mucosa; Nasopharyngeal  Result Value Ref Range Status   MRSA by PCR NEGATIVE NEGATIVE Final    Comment:        The GeneXpert MRSA Assay (FDA approved for NASAL specimens only), is one component of a comprehensive MRSA colonization surveillance program. It is not intended to diagnose MRSA infection nor to guide or monitor treatment for MRSA infections. Performed at [facility], [place] [address], [place], [place] [address]     Radiology Reports CT ANGIO CHEST PE W OR WO CONTRAST  Result Date: 04/09/2019 CLINICAL DATA:  COVID pneumonia, hypoxia, shortness of breath and elevated D-dimer. EXAM: CT ANGIOGRAPHY CHEST WITH CONTRAST TECHNIQUE: Multidetector CT imaging of the chest was performed using the standard protocol during bolus administration of intravenous contrast. Multiplanar CT image reconstructions and MIPs were obtained to evaluate the vascular anatomy. CONTRAST:  99mL OMNIPAQUE IOHEXOL 350 MG/ML SOLN COMPARISON:  Chest x-ray on 04/05/2019 FINDINGS: Cardiovascular: The pulmonary arteries are well opacified. There is no evidence pulmonary embolism. Central pulmonary arteries are normal  in caliber. The heart size is normal. The thoracic aorta is normal in caliber. Trace amount of pericardial fluid. Calcified plaque is noted of the coronary arteries primarily in the distribution of the LAD. Mediastinum/Nodes: Mildly prominent right hilar nodal tissue  measuring up to 1.5 cm. No evidence of enlarged mediastinal or axillary lymph nodes. Lungs/Pleura: Severe airspace disease is seen throughout all lobes of both lungs in a ground-glass pattern. There is an associated very small right pleural effusion. No pneumothorax. Upper Abdomen: No acute abnormality. Musculoskeletal: No chest wall abnormality. No acute or significant osseous findings. Review of the MIP images confirms the above findings. IMPRESSION: 1. No evidence of pulmonary embolism. 2. Severe airspace disease throughout all lobes of both lungs in a ground-glass pattern. Findings are consistent with severe bilateral COVID-19 pneumonia. 3. Mildly prominent right hilar nodal tissue measuring up to 1.5 cm in short axis. This is likely reactive. 4. Coronary artery disease with calcified plaque in the distribution of the LAD. Electronically Signed   By: Aletta Edouard M.D.   On: 04/09/2019 08:58   DG Chest Port 1 View  Result Date: 04/16/2019 CLINICAL DATA:  74 year old male with a history of COVID pneumonia EXAM: PORTABLE CHEST 1 VIEW COMPARISON:  CT 04/09/2019, plain film 04/05/2019 FINDINGS: Cardiomediastinal silhouette unchanged in size and contour. Similar appearance of low lung volumes and mixed airspace and interstitial opacities of the bilateral lungs. No pneumothorax or pleural effusion. IMPRESSION: Unchanged appearance of the lungs with mixed interstitial and airspace disease bilateral compatible with multifocal pneumonia Electronically Signed   By: Corrie Mckusick D.O.   On: 04/16/2019 07:48   DG Chest Port 1 View  Result Date: 04/05/2019 CLINICAL DATA:  COVID positive low oxygen level EXAM: PORTABLE CHEST 1 VIEW COMPARISON:  None.  FINDINGS: Bilateral ground-glass opacities and consolidations. No pleural effusion. Normal heart size. Aortic atherosclerosis. No pneumothorax. IMPRESSION: Bilateral ground-glass opacities and consolidations consistent with bilateral pneumonia. Electronically Signed   By: Donavan Foil M.D.   On: 04/05/2019 16:47   VAS [place] LOWER EXTREMITY VENOUS (DVT)  Result Date: 04/09/2019  Lower Venous Study Indications: Covid positive with worsening positive d-Dimer and hypoxia.  Anticoagulation: Lovenox. Comparison Study: No priors. Performing Technologist: Oda Cogan RDMS, RVT  Examination Guidelines: A complete evaluation includes B-mode imaging, spectral Doppler, color Doppler, and power Doppler as needed of all accessible portions of each vessel. Bilateral testing is considered an integral part of a complete examination. Limited examinations for reoccurring indications may be performed as noted.  +---------+---------------+---------+-----------+----------+--------------+ RIGHT    CompressibilityPhasicitySpontaneityPropertiesThrombus Aging +---------+---------------+---------+-----------+----------+--------------+ CFV      Full           Yes      Yes                                 +---------+---------------+---------+-----------+----------+--------------+ SFJ      Full                                                        +---------+---------------+---------+-----------+----------+--------------+ FV Prox  Full                                                        +---------+---------------+---------+-----------+----------+--------------+ FV Mid   Full                                                        +---------+---------------+---------+-----------+----------+--------------+  FV DistalFull                                                        +---------+---------------+---------+-----------+----------+--------------+ PFV      Full                                                         +---------+---------------+---------+-----------+----------+--------------+ POP      Full           Yes      Yes                                 +---------+---------------+---------+-----------+----------+--------------+ PTV      Full                                                        +---------+---------------+---------+-----------+----------+--------------+ PERO     Full                                                        +---------+---------------+---------+-----------+----------+--------------+   +---------+---------------+---------+-----------+----------+--------------+ LEFT     CompressibilityPhasicitySpontaneityPropertiesThrombus Aging +---------+---------------+---------+-----------+----------+--------------+ CFV      Full           Yes      Yes                                 +---------+---------------+---------+-----------+----------+--------------+ SFJ      Full                                                        +---------+---------------+---------+-----------+----------+--------------+ FV Prox  Full                                                        +---------+---------------+---------+-----------+----------+--------------+ FV Mid   Full                                                        +---------+---------------+---------+-----------+----------+--------------+ FV DistalFull                                                        +---------+---------------+---------+-----------+----------+--------------+  PFV      Full                                                        +---------+---------------+---------+-----------+----------+--------------+ POP      Full           Yes      Yes                                 +---------+---------------+---------+-----------+----------+--------------+ PTV      Full                                                         +---------+---------------+---------+-----------+----------+--------------+ PERO     Partial                                      Acute          +---------+---------------+---------+-----------+----------+--------------+ Soleal   Full                                         Acute          +---------+---------------+---------+-----------+----------+--------------+     Summary: Right: There is no evidence of deep vein thrombosis in the lower extremity. No cystic structure found in the popliteal fossa. Left: Findings consistent with acute deep vein thrombosis involving the left peroneal veins, and left soleal veins. Short segment of localized thrombus in the peroneal and soleal veins in the mid segment.  *See table(s) above for measurements and observations. Electronically signed by Ruta Hinds MD on 04/09/2019 at 2:56:25 PM.    Final

## 2019-04-19 LAB — MAGNESIUM: Magnesium: 2.1 mg/dL (ref 1.7–2.4)

## 2019-04-19 LAB — FERRITIN: Ferritin: 369 ng/mL — ABNORMAL HIGH (ref 24–336)

## 2019-04-19 LAB — D-DIMER, QUANTITATIVE: D-Dimer, Quant: 7.21 ug/mL-FEU — ABNORMAL HIGH (ref 0.00–0.50)

## 2019-04-19 LAB — COMPREHENSIVE METABOLIC PANEL
ALT: 18 U/L (ref 0–44)
AST: 19 U/L (ref 15–41)
Albumin: 2.9 g/dL — ABNORMAL LOW (ref 3.5–5.0)
Alkaline Phosphatase: 71 U/L (ref 38–126)
Anion gap: 8 (ref 5–15)
BUN: 24 mg/dL — ABNORMAL HIGH (ref 8–23)
CO2: 23 mmol/L (ref 22–32)
Calcium: 8.3 mg/dL — ABNORMAL LOW (ref 8.9–10.3)
Chloride: 102 mmol/L (ref 98–111)
Creatinine, Ser: 0.7 mg/dL (ref 0.61–1.24)
GFR calc Af Amer: >60 mL/min (ref >60)
GFR calc non Af Amer: >60 mL/min (ref >60)
Glucose, Bld: 116 mg/dL — ABNORMAL HIGH (ref 70–99)
Potassium: 4 mmol/L (ref 3.5–5.1)
Sodium: 133 mmol/L — ABNORMAL LOW (ref 135–145)
Total Bilirubin: 1.7 mg/dL — ABNORMAL HIGH (ref 0.3–1.2)
Total Protein: 5.6 g/dL — ABNORMAL LOW (ref 6.5–8.1)

## 2019-04-19 LAB — CBC
HCT: 41.6 % (ref 39.0–52.0)
Hemoglobin: 14 g/dL (ref 13.0–17.0)
MCH: 30.6 pg (ref 26.0–34.0)
MCHC: 33.7 g/dL (ref 30.0–36.0)
MCV: 90.8 fL (ref 80.0–100.0)
Platelets: 227 10*3/uL (ref 150–400)
RBC: 4.58 MIL/uL (ref 4.22–5.81)
RDW: 14.1 % (ref 11.5–15.5)
WBC: 13.6 10*3/uL — ABNORMAL HIGH (ref 4.0–10.5)
nRBC: 0 % (ref 0.0–0.2)

## 2019-04-19 LAB — GLUCOSE, CAPILLARY
Glucose-Capillary: 115 mg/dL — ABNORMAL HIGH (ref 70–99)
Glucose-Capillary: 228 mg/dL — ABNORMAL HIGH (ref 70–99)

## 2019-04-19 LAB — C-REACTIVE PROTEIN: CRP: 0.7 mg/dL (ref <1.0)

## 2019-04-19 MED ORDER — MAGIC MOUTHWASH W/LIDOCAINE
15.0000 mL | Freq: Four times a day (QID) | ORAL | Status: DC | PRN
  Administered 2019-04-19 – 2019-05-01 (×8): 15 mL via ORAL
  Filled 2019-04-19 (×9): qty 15

## 2019-04-19 MED ORDER — FLUTICASONE PROPIONATE 50 MCG/ACT NA SUSP
2.0000 | Freq: Every day | NASAL | Status: DC
  Administered 2019-04-19 – 2019-05-03 (×15): 2 via NASAL
  Filled 2019-04-19: qty 16

## 2019-04-19 MED ORDER — POTASSIUM CHLORIDE CRYS ER 20 MEQ PO TBCR
40.0000 meq | EXTENDED_RELEASE_TABLET | Freq: Once | ORAL | Status: AC
  Administered 2019-04-19: 08:00:00 40 meq via ORAL
  Filled 2019-04-19: qty 2

## 2019-04-19 MED ORDER — LIDOCAINE-EPINEPHRINE 2 %-1:100000 IJ SOLN: 20.0000 mL | INTRAMUSCULAR | Status: DC

## 2019-04-19 MED ORDER — SALINE SPRAY 0.65 % NA SOLN
1.0000 | NASAL | Status: DC | PRN
  Administered 2019-04-19 – 2019-04-24 (×3): 1 via NASAL
  Filled 2019-04-19: qty 44

## 2019-04-19 MED ORDER — OXYMETAZOLINE HCL 0.05 % NA SOLN
1.0000 | Freq: Two times a day (BID) | NASAL | Status: AC
  Administered 2019-04-19 – 2019-04-21 (×6): 1 via NASAL
  Filled 2019-04-19: qty 15

## 2019-04-19 MED ORDER — PREDNISONE 1 MG PO TABS
3.0000 mg | ORAL_TABLET | Freq: Every day | ORAL | Status: DC
  Administered 2019-04-20 – 2019-05-03 (×14): 3 mg via ORAL
  Filled 2019-04-19 (×14): qty 3

## 2019-04-19 NOTE — Progress Notes (Signed)
Nutrition Follow-up  DOCUMENTATION CODES:   Not applicable  INTERVENTION:   Pt receiving Hormel Shake daily with Breakfast which provides 520 kcals and 22 g of protein and Magic cup BID with lunch and dinner, each supplement provides 290 kcal and 9 grams of protein, automatically on meal trays to optimize nutritional intake.   Encourage PO intake    NUTRITION DIAGNOSIS:   Increased nutrient needs related to catabolic [id number] PNA) as evidenced by estimated needs. Ongoing.   GOAL:   Patient will meet greater than or equal to 90% of their needs Progressing.   MONITOR:   PO intake, Supplement acceptance  REASON FOR ASSESSMENT:   Malnutrition Screening Tool    ASSESSMENT:   Pt with PMH of DM, HTN, HLD, and polymyalgia rheumatica who is admitted with SOB x 1 week when dx with COVID-19.   Per RN pt is eating and drinking well.  Pt remains on 2-3 L O2 via HFNC with pt requiring 10 L with minimal activity.   Meal Completion: 75-100% of meals.   Per I&D pt with nodular lesions in neck/lip that have started to cavitate - possible fungal infection biopsy pending.   Medications reviewed and include: vitamin C, decadron, SSI with meals/bedtime, 4 units novolog with meals, 6 units lantus, prednisone, zinc  Remdesivir  Labs reviewed:  Na 133 (L)CBG's: 223-220-115 Lab Results  Component Value Date   HGBA1C 7.1 (H) 04/06/2019     NUTRITION - FOCUSED PHYSICAL EXAM:  Deferred   Diet Order:   Diet Order            Diet heart healthy/carb modified Room service appropriate? Yes; Fluid consistency: Thin  Diet effective now              EDUCATION NEEDS:   Not appropriate for education at this time  Skin:  Skin Assessment: (cavitary lesions neck and lip)  Last BM:  1/26  Height:   Ht Readings from Last 1 Encounters:  04/06/19 5\' 10"  (1.778 m)    Weight:   Wt Readings from Last 1 Encounters:  04/06/19 78 kg    Ideal Body Weight:  75.4 kg  BMI:   Body mass index is 24.67 kg/m.  Estimated Nutritional Needs:   Kcal:  1950-2300  Protein:  115-132 grams  Fluid:  >1.9 L/day  Maylon Peppers RD, LDN, CNSC [phone] Pager [phone] After Hours Pager

## 2019-04-19 NOTE — Procedures (Signed)
  11:57 AM  PATIENT:  Dustin Diaz  74 y.o. male  PRE-OPERATIVE DIAGNOSIS:  COVID, vesicles of neck  POST-OPERATIVE DIAGNOSIS:  SAA  PROCEDURE:  Punch biopsy of skin x2  SURGEON:  Saverio Danker, PA-C  ANESTHESIA:   local  DRAINS: none   LOCAL MEDICATIONS USED:  LIDOCAINE WITH EPI  and Amount: 3 ml  SPECIMEN:  Source of Specimen:  right neck vesicle and Biopsy / Limited Resection  DISPOSITION OF SPECIMEN:  Microbiology and Pathology  INDICATION FOR PROCEDURE: This is a male who has been admitted with COVID-19.  He was quite ill and has subsequently improved.  Last Friday he was noted to start having small vesicles that were arising from his right neck area.  As they have progressed they have grown in size and began to change to a purple color.  See picture in the chart.  He has one on his lip as well.  A punch biopsy has been requested for pathology as well as for cultures, etc.  PROCEDURE: After discussion of the procedure along with risks and complications such as bleeding and infection were discussed with the patient, verbal consent was obtained from the patient.  This was witnessed by Dr. Sloan Leiter as well as his RN, Iver Nestle, who was at the bedside.  His right neck was prepped with chloraprep.  2% Lidocaine with epi was used to anesthetize the skin.  2 separate 10mm punch tools were used to take 2 separate biopsies.  Care was taken to try and get normal tissue as well as abnormal tissue.  One biopsy was placed in formalin for pathology and the other was placed in saline for microbiology for cultures, etc.  Pressure was held to achieve hemostasis.  Dry dressing was applied.  The patient tolerated the procedure well with no apparent complications.  PATIENT DISPOSITION:  Room   Henreitta Cea 12:08 PM 04/19/2019

## 2019-04-19 NOTE — Progress Notes (Signed)
Pharmacy Antibiotic Note  Dustin Diaz is a 74 y.o. 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 Noted risk factors of chronic prednisone/Actemra doses 1/15 and 1/16.    Pharmacy has been consulted for amphotericin B dosing.   Scr today is stable at 0.70- Will maintain fluid boluses at 500 mL pre and post amphotericin B dose. Magnesium is 2.1. Potassium is down slightly to 4.0. Will give 40 mEq replacement in anticipation of electrolyte wasting with continued amphotericin B doses.   Potassium and Magnesium will be monitored daily and replaced by pharmacy per protocol.  Plan: Liposomal amphotericin B 3 mg/kg (230 mg) every 24 hours  Normal saline 500 mL 1 hour prior to dose and immediately after Dextrose flushes between saline boluses and amphotericin B Potassium 40 mEq X 1  Daily K and Mg  Pharmacy will replace per protocol     Height: 5\' 10"  (177.8 cm) Weight: 171 lb 15.3 oz (78 kg) IBW/kg (Calculated) : 73  Temp (24hrs), Avg:98 F (36.7 C), Min:98 F (36.7 C), Max:98 F (36.7 C)  Recent Labs  Lab 04/15/19 1255 04/16/19 0755 04/17/19 0155 04/18/19 0238 04/19/19 0135  WBC 18.1* 14.2* 17.1* 14.4* 13.6*  CREATININE 1.00 0.94 0.78 0.83 0.70    Estimated Creatinine Clearance: 86.2 mL/min (by C-G formula based on SCr of 0.7 mg/dL).    Allergies  Allergen Reactions  . Codeine       Thank you for allowing pharmacy to be a part of this patient's care.  Jimmy Footman, PharmD, BCPS, BCIDP Infectious Diseases Clinical Pharmacist Phone: [phone] 04/19/2019 8:08 AM

## 2019-04-19 NOTE — Consult Note (Signed)
Dustin Diaz 03-30-1945  [id number].    Requesting MD: Dr. Oren Binet Chief Complaint/Reason for Consult: COVID, vesicles on neck  HPI:  This is a 74 yo white male who was admitted for COVID 19 illness on 04/05/19.  He has ha d significant hospital course but is improving.  He is currently in a regular floor room out of the ICU and improving.  Last week, it was noted that he was developing these vesicular like areas on his right neck.  They have continued to grow, become painful, and the central area has darkened and turned a purple color.  They are nonblanching.  He has an area on his lower lip that appears to be similar to these as well.  New areas continue to erupt on his neck as well.  We have been asked to see for a biopsy of these areas for pathology was well culture, etc.  ROS: ROS: Please see HPI, otherwise on O2, but all other systems are currently negative.  Family History  Problem Relation Age of Onset  . Heart failure Father     Past Medical History:  Diagnosis Date  . Diabetes mellitus without complication ([place])   . High cholesterol   . Hyperlipemia   . Hypertension   . Polymyalgia rheumatica (HCC)     Past Surgical History:  Procedure Laterality Date  . APPENDECTOMY  1956  . BACK SURGERY  1988  . SKIN SURGERY  2012   melanoma removal  . TONSILLECTOMY      Social History:  reports that he has never smoked. He has never used smokeless tobacco. He reports current alcohol use. He reports that he does not use drugs.  Allergies:  Allergies  Allergen Reactions  . Codeine     Medications Prior to Admission  Medication Sig Dispense Refill  . amLODipine-valsartan (EXFORGE) 5-160 MG tablet Take 1 tablet by mouth daily.    Marland Kitchen aspirin 81 MG tablet Take 81 mg by mouth daily.    Marland Kitchen atorvastatin (LIPITOR) 10 MG tablet Take 10 mg by mouth daily.    . fluticasone (FLONASE) 50 MCG/ACT nasal spray Place 2 sprays into the nose daily as needed for allergies.      Marland Kitchen glimepiride (AMARYL) 2 MG tablet Take 2 mg by mouth every morning.    Marland Kitchen JARDIANCE 25 MG TABS tablet Take 25 mg by mouth every morning.    . latanoprost (XALATAN) 0.005 % ophthalmic solution Place 1 drop into both eyes at bedtime.    . metFORMIN (GLUCOPHAGE-XR) 500 MG 24 hr tablet Take 2,000 mg by mouth every morning.    . pioglitazone (ACTOS) 45 MG tablet Take 45 mg by mouth daily.    . predniSONE (DELTASONE) 1 MG tablet Take 3 mg by mouth daily.     . tamsulosin (FLOMAX) 0.4 MG CAPS capsule Take 0.4 mg by mouth at bedtime.       Physical Exam: Blood pressure (!) 145/67, pulse 61, temperature 98 F (36.7 C), temperature source Oral, resp. rate 18, height 5\' 10"  (1.778 m), weight 78 kg, SpO2 98 %. General: pleasant, WD, WN white male who is laying in bed in NAD HEENT: head is normocephalic, atraumatic.  Sclera are noninjected.  PERRL.  Ears and nose without any masses or lesions.  Mouth is pink but dry.  His lower lip has a lesion with eschar present. Neck: supple, carotid pulses bilaterally, trachea is midline, multiple vesicular type lesions with central purpling of the skin.  The 2 largest lesions are about 1.5cmx1.5cm.  There are several other smaller vesicles that appear in earlier staging with no central color change.  These are tender to palpation.  See procedure note for description of punch biopsies. Skin: warm and dry with no masses, lesions, or rashes, except noted above in Neck Neuro: Cranial nerves 2-12 grossly intact, speech is normal Psych: A&Ox3 with an appropriate affect.   Results for orders placed or performed during the hospital encounter of 04/05/19 (from the past 48 hour(s))  Glucose, capillary     Status: Abnormal   Collection Time: 04/17/19  3:49 PM  Result Value Ref Range   Glucose-Capillary 282 (H) 70 - 99 mg/dL  MRSA PCR Screening     Status: None   Collection Time: 04/17/19  4:17 PM   Specimen: Nasal Mucosa; Nasopharyngeal  Result Value Ref Range   MRSA by  PCR NEGATIVE NEGATIVE    Comment:        The GeneXpert MRSA Assay (FDA approved for NASAL specimens only), is one component of a comprehensive MRSA colonization surveillance program. It is not intended to diagnose MRSA infection nor to guide or monitor treatment for MRSA infections. Performed at [facility], [place] [address], [place], [place] [address]   Glucose, capillary     Status: Abnormal   Collection Time: 04/17/19  8:18 PM  Result Value Ref Range   Glucose-Capillary 185 (H) 70 - 99 mg/dL  C-reactive protein     Status: None   Collection Time: 04/18/19  2:38 AM  Result Value Ref Range   CRP 0.6 <1.0 mg/dL    Comment: Performed at [facility], [place] [address], [place], [place] [address]  Comprehensive metabolic panel     Status: Abnormal   Collection Time: 04/18/19  2:38 AM  Result Value Ref Range   Sodium 136 135 - 145 mmol/L   Potassium 4.6 3.5 - 5.1 mmol/L   Chloride 102 98 - 111 mmol/L   CO2 26 22 - 32 mmol/L   Glucose, Bld 110 (H) 70 - 99 mg/dL   BUN 30 (H) 8 - 23 mg/dL   Creatinine, Ser 0.83 0.61 - 1.24 mg/dL   Calcium 8.4 (L) 8.9 - 10.3 mg/dL   Total Protein 5.5 (L) 6.5 - 8.1 g/dL   Albumin 2.9 (L) 3.5 - 5.0 g/dL   AST 18 15 - 41 U/L   ALT 15 0 - 44 U/L   Alkaline Phosphatase 78 38 - 126 U/L   Total Bilirubin 0.9 0.3 - 1.2 mg/dL   GFR calc non Af Amer >60 >60 mL/min   GFR calc Af Amer >60 >60 mL/min   Anion gap 8 5 - 15    Comment: Performed at [facility], [place] [address] [place], [place] [address]  CBC     Status: Abnormal   Collection Time: 04/18/19  2:38 AM  Result Value Ref Range   WBC 14.4 (H) 4.0 - 10.5 K/uL   RBC 4.64 4.22 - 5.81 MIL/uL   Hemoglobin 14.1 13.0 - 17.0 g/dL   HCT 42.1 39.0 - 52.0 %   MCV 90.7 80.0 - 100.0 fL   MCH 30.4 26.0 - 34.0 pg   MCHC 33.5 30.0 - 36.0 g/dL   RDW 14.1 11.5 - 15.5 %   Platelets 233 150 - 400 K/uL   nRBC 0.0 0.0 - 0.2 %    Comment: Performed at [facility], [place]  [place], [place] [address]  Ferritin     Status: Abnormal   Collection Time: 04/18/19  2:38 AM  Result Value Ref Range   Ferritin 382 (H) 24 - 336 ng/mL    Comment: Performed at [facility], [place] [address], [place], [place] [address]  D-dimer, quantitative (not at [facility])     Status: Abnormal   Collection Time: 04/18/19  2:38 AM  Result Value Ref Range   D-Dimer, Quant 9.83 (H) 0.00 - 0.50 ug/mL-FEU    Comment: (NOTE) At the manufacturer cut-off of 0.50 ug/mL FEU, this assay has been documented to exclude PE with a sensitivity and negative predictive value of 97 to 99%.  At this time, this assay has not been approved by the FDA to exclude DVT/VTE. Results should be correlated with clinical presentation. Performed at [facility], [place] [address], [place], [place] [address]   Glucose, capillary     Status: Abnormal   Collection Time: 04/18/19  7:40 AM  Result Value Ref Range   Glucose-Capillary 280 (H) 70 - 99 mg/dL  Glucose, capillary     Status: Abnormal   Collection Time: 04/18/19 11:38 AM  Result Value Ref Range   Glucose-Capillary 130 (H) 70 - 99 mg/dL  Cryptococcal antigen     Status: None   Collection Time: 04/18/19  3:00 PM  Result Value Ref Range   Crypto Ag NEGATIVE NEGATIVE   Cryptococcal Ag Titer NOT INDICATED NOT INDICATED    Comment: Performed at [facility], [place] [address], [place], [place] [address]  Fungus culture, blood     Status: None (Preliminary result)   Collection Time: 04/18/19  3:00 PM   Specimen: BLOOD  Result Value Ref Range   Specimen Description      BLOOD RIGHT ANTECUBITAL Performed at [place] [address], [place], [place] [address]    Special Requests      BOTTLES DRAWN AEROBIC ONLY Blood Culture adequate volume Performed at [place] [address], [place], [place] [address]    Culture      NO GROWTH < 12  HOURS Performed at [place] [address] [place], [place] [address]    Report Status PENDING   Fungus culture, blood     Status: None (Preliminary result)   Collection Time: 04/18/19  3:05 PM   Specimen: BLOOD RIGHT HAND  Result Value Ref Range   Specimen Description      BLOOD RIGHT HAND Performed at [place] [address], [place], [place] [address]    Special Requests      BOTTLES DRAWN AEROBIC ONLY Blood Culture adequate volume Performed at [place] [address] [place], [place] [address]    Culture      NO GROWTH < 12 HOURS Performed at [place] [address] [place], [place] [address]    Report Status PENDING   Glucose, capillary     Status: Abnormal   Collection Time: 04/18/19  3:44 PM  Result Value Ref Range   Glucose-Capillary 223 (H) 70 - 99 mg/dL  Lactate dehydrogenase     Status: Abnormal   Collection Time: 04/18/19  8:05 PM  Result Value Ref Range   LDH 323 (H) 98 - 192 U/L    Comment: Performed at [facility], [place] [address] [place], [place] [address]  Glucose, capillary     Status: Abnormal  Collection Time: 04/18/19  9:50 PM  Result Value Ref Range   Glucose-Capillary 220 (H) 70 - 99 mg/dL  C-reactive protein     Status: None   Collection Time: 04/19/19  1:35 AM  Result Value Ref Range   CRP 0.7 <1.0 mg/dL    Comment: Performed at [facility], [place] [address], [place], [place] [address]  Comprehensive metabolic panel     Status: Abnormal   Collection Time: 04/19/19  1:35 AM  Result Value Ref Range   Sodium 133 (L) 135 - 145 mmol/L   Potassium 4.0 3.5 - 5.1 mmol/L   Chloride 102 98 - 111 mmol/L   CO2 23 22 - 32 mmol/L   Glucose, Bld 116 (H) 70 - 99 mg/dL   BUN 24 (H) 8 - 23 mg/dL   Creatinine, Ser 0.70 0.61 - 1.24 mg/dL   Calcium 8.3 (L) 8.9 - 10.3 mg/dL   Total Protein 5.6 (L) 6.5 - 8.1 g/dL   Albumin 2.9 (L) 3.5 - 5.0 g/dL   AST  19 15 - 41 U/L   ALT 18 0 - 44 U/L   Alkaline Phosphatase 71 38 - 126 U/L   Total Bilirubin 1.7 (H) 0.3 - 1.2 mg/dL   GFR calc non Af Amer >60 >60 mL/min   GFR calc Af Amer >60 >60 mL/min   Anion gap 8 5 - 15    Comment: Performed at [facility], [place] [address] [place], [place] [address]  CBC     Status: Abnormal   Collection Time: 04/19/19  1:35 AM  Result Value Ref Range   WBC 13.6 (H) 4.0 - 10.5 K/uL   RBC 4.58 4.22 - 5.81 MIL/uL   Hemoglobin 14.0 13.0 - 17.0 g/dL   HCT 41.6 39.0 - 52.0 %   MCV 90.8 80.0 - 100.0 fL   MCH 30.6 26.0 - 34.0 pg   MCHC 33.7 30.0 - 36.0 g/dL   RDW 14.1 11.5 - 15.5 %   Platelets 227 150 - 400 K/uL   nRBC 0.0 0.0 - 0.2 %    Comment: Performed at [facility], [place] [address], [place], [place] [address]  Ferritin     Status: Abnormal   Collection Time: 04/19/19  1:35 AM  Result Value Ref Range   Ferritin 369 (H) 24 - 336 ng/mL    Comment: Performed at [facility], [place] [address] [place], [place] [address]  D-dimer, quantitative (not at [facility])     Status: Abnormal   Collection Time: 04/19/19  1:35 AM  Result Value Ref Range   D-Dimer, Quant 7.21 (H) 0.00 - 0.50 ug/mL-FEU    Comment: (NOTE) At the manufacturer cut-off of 0.50 ug/mL FEU, this assay has been documented to exclude PE with a sensitivity and negative predictive value of 97 to 99%.  At this time, this assay has not been approved by the FDA to exclude DVT/VTE. Results should be correlated with clinical presentation. Performed at [facility], [place] [address], [place], [place] [address]   Magnesium     Status: None   Collection Time: 04/19/19  1:35 AM  Result Value Ref Range   Magnesium 2.1 1.7 - 2.4 mg/dL    Comment: Performed at [facility], [place] [address], [place], [place] [address]  Glucose, capillary     Status: Abnormal   Collection Time: 04/19/19  7:52 AM  Result Value Ref Range    Glucose-Capillary 115 (  H) 70 - 99 mg/dL  Glucose, capillary     Status: Abnormal   Collection Time: 04/19/19 11:49 AM  Result Value Ref Range   Glucose-Capillary 228 (H) 70 - 99 mg/dL   No results found.    Assessment/Plan COVID 19  Right neck vesicles of unclear etiology Punch biopsies completed x2 for pathology and for micro for cultures, etc. As requested by ID. -leave dressing in place today and may change tomorrow -hemostasis was achieved at the end of the procedure.  If further bleeding, pressure should resolve the issue. -discussed wound care with Dr. Sloan Leiter. -please call if we can be of further assistance   Henreitta Cea, [facility] Surgery 04/19/2019, 12:23 PM Please see Amion for pager number during day hours 7:00am-4:30pm or 7:00am -11:30am on weekends

## 2019-04-19 NOTE — Progress Notes (Signed)
PROGRESS NOTE                                                                                                                                                                                                             Patient Demographics:    Dustin Diaz, is a 74 y.o. male, DOB - 07-26-1945, [id number]  Outpatient Primary MD for the patient is Lavone Orn, MD   Admit date - 04/05/2019   LOS - 14  Chief Complaint  Patient presents with  . Covid positive  . low O2       Brief Narrative: Patient is a 73 y.o. 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  Hospital course complicated by slow improvement in hypoxia and lower extremity DVT.  See below for further details.   Subjective:   No major issues overnight-he is now down to 1.5 L-but RN reports that he has severe shortness of breath with minimal ambulation-and requires up to 10 L at times to maintain sats.   Assessment  & Plan :   Acute Hypoxic Resp Failure due to Covid 19 Viral pneumonia: Slowly improving-down to 1.5 L at rest, but still very dyspneic with minimal movement-and at times requires 10 L to maintain O2 saturations.  Due to persistent/waxing and waning hypoxemia-and leukocytosis-he was briefly started on antibiotics-but this was discontinued on 1/27-see below discussion.    Fever: afebrile  O2 requirements:  SpO2: 98 % O2 Flow Rate (L/min): 3 L/min FiO2 (%): 100 %   COVID-19 Labs: Recent Labs    04/17/19 0155 04/18/19 0238 04/18/19 2005 04/19/19 0135  DDIMER 10.86* 9.83*  --  7.21*  FERRITIN 417* 382*  --  369*  LDH  --   --  323*  --   CRP 0.6 0.6  --  0.7       Component Value Date/Time   BNP 58.7 04/17/2019 0155    Recent Labs  Lab 04/14/19 0440 04/16/19 0755    PROCALCITON <0.10 <0.10    No results found for: SARSCOV2NAA   COVID-19 Medications: Decadron: 1/14>>1/23 Remdesivir: 1/14>> 1/18 Actemra: 1/15 x 1, 1/16 x 1  Antibiotics: Vancomycin 1/26>> 1/27 Cefepime 1/26>> 1/27  Prone/Incentive Spirometry:  encouraged  incentive spirometry use 3-4/hour.  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  Started on amphotericin on 1/27-ID following.  Will go ahead and order CT head and CT abdomen/pelvis as recommended by ID.  Underwent punch biopsy of these lesions on 1/28-we will await culture sensitivity/biopsy results.  Antifungal agents: Amphotericin B 1/27>>  Left lower extremity DVT: Continue Eliquis-CTA chest on 1/18 without PE.  DM-2: CBG stable-continue Lantus 6 units and NovoLog.  Follow.  CBG (last 3)  Recent Labs    04/18/19 2150 04/19/19 0752 04/19/19 1149  GLUCAP 220* 115* 228*    HTN: BP controlled-have discontinued all antihypertensives on 1/26-due to dizziness upon standing.  Dyslipidemia: Continue statin  History of PMR: Continue prednisone-dose gradually taper down back to his usual home regimen of 3 mg.  Right hilar lymphadenopathy: Likely reactive-repeat imaging in 3 to 6 months as outpatient.  Nutrition Problem: Nutrition Problem: Increased nutrient needs Etiology: catabolic [id number] PNA) Signs/Symptoms: estimated needs Interventions: Hormel Shake   Consults  :  None  Procedures  :   1/28>> Punch biopsy right neck lesion  ABG: No results found for: PHART, PCO2ART, PO2ART, HCO3, TCO2, ACIDBASEDEF, O2SAT  Vent Settings: N/A   Condition - Extremely Guarded  Family Communication  :  Spouse updated over the phone 1/28  Code Status :  Full Code  Diet :  Diet Order            Diet heart healthy/carb modified Room service  appropriate? Yes; Fluid consistency: Thin  Diet effective now               Disposition Plan  :  Remain hospitalized-Home when hypoxia improves.  Barriers to discharge: Hypoxia requiring O2 supplementation  Antimicorbials  :    Anti-infectives (From admission, onward)   Start     Dose/Rate Route Frequency Ordered Stop   04/18/19 1800  amphotericin B liposome (AMBISOME) 230 mg in dextrose 5 % 500 mL IVPB     3 mg/kg  78 kg 278.8 mL/hr over 120 Minutes Intravenous Every 24 hours 04/18/19 1447     04/18/19 1000  vancomycin (VANCOCIN) IVPB 1000 mg/200 mL premix  Status:  Discontinued     1,000 mg 200 mL/hr over 60 Minutes Intravenous Every 12 hours 04/17/19 1400 04/18/19 1138   04/17/19 1500  ceFEPIme (MAXIPIME) 2 g in sodium chloride 0.9 % 100 mL IVPB  Status:  Discontinued     2 g 200 mL/hr over 30 Minutes Intravenous Every 8 hours 04/17/19 1359 04/18/19 1335   04/17/19 1430  vancomycin (VANCOREADY) IVPB 1500 mg/300 mL     1,500 mg 150 mL/hr over 120 Minutes Intravenous  Once 04/17/19 1400 04/17/19 1825   04/06/19 1000  remdesivir 100 mg in sodium chloride 0.9 % 100 mL IVPB  Status:  Discontinued     100 mg 200 mL/hr over 30 Minutes Intravenous Daily 04/05/19 1919 04/05/19 1919   04/06/19 1000  remdesivir 100 mg in sodium chloride 0.9 % 100 mL IVPB     100 mg 200 mL/hr over 30 Minutes Intravenous Daily 04/05/19 1919 04/09/19 0948   04/06/19 0400  azithromycin (ZITHROMAX) 500 mg in sodium chloride 0.9 % 250 mL IVPB  Status:  Discontinued     500 mg 250 mL/hr over 60 Minutes Intravenous Every 24 hours 04/06/19 0242 04/06/19 0725   04/06/19 0300  cefTRIAXone (ROCEPHIN)  1 g in sodium chloride 0.9 % 100 mL IVPB  Status:  Discontinued     1 g 200 mL/hr over 30 Minutes Intravenous Every 24 hours 04/06/19 0242 04/06/19 0725   04/05/19 2100  remdesivir 200 mg in sodium chloride 0.9% 250 mL IVPB     200 mg 580 mL/hr over 30 Minutes Intravenous Once 04/05/19 1919 04/05/19 2200    04/05/19 1918  remdesivir 200 mg in sodium chloride 0.9% 250 mL IVPB  Status:  Discontinued     200 mg 580 mL/hr over 30 Minutes Intravenous Once 04/05/19 1919 04/05/19 1919      Inpatient Medications  Scheduled Meds: . apixaban  5 mg Oral BID  . vitamin C  500 mg Oral Daily  . atorvastatin  10 mg Oral Daily  . dextrose  10 mL Intravenous Q24H  . dextrose  10 mL Intravenous Q24H  . insulin aspart  0-15 Units Subcutaneous TID WC  . insulin aspart  0-5 Units Subcutaneous QHS  . insulin aspart  4 Units Subcutaneous TID WC  . insulin glargine  6 Units Subcutaneous QHS  . Ipratropium-Albuterol  1 puff Inhalation Q6H  . latanoprost  1 drop Both Eyes QHS  . pantoprazole  40 mg Oral Daily  . predniSONE  5 mg Oral Daily  . sodium chloride  500 mL Intravenous Q24H  . sodium chloride  500 mL Intravenous Q24H  . sodium chloride flush  10-40 mL Intracatheter Q12H  . zinc sulfate  220 mg Oral Daily   Continuous Infusions: . amphotericin  B  Liposome (AMBISOME) ADULT IV 230 mg (04/18/19 1815)   PRN Meds:.acetaminophen, acetaminophen, ALPRAZolam, chlorpheniramine-HYDROcodone, diphenhydrAMINE **OR** diphenhydrAMINE, fluticasone, guaiFENesin-dextromethorphan, lip balm, magic mouthwash w/lidocaine, meperidine (DEMEROL) injection, ondansetron **OR** ondansetron (ZOFRAN) IV, sodium chloride flush   Time Spent in minutes  25  See all Orders from today for further details   Oren Binet M.D on 04/19/2019 at 1:11 PM  To page go to [email or web address] - use universal password  [facility] -  Office  [phone]    Objective:   Vitals:   04/18/19 0742 04/18/19 1600 04/19/19 0015 04/19/19 0600  BP: (!) 145/67     Pulse: 68   61  Resp: 17 18    Temp: 98.4 F (36.9 C)  98 F (36.7 C)   TempSrc: Oral  Oral   SpO2: 95%   98%  Weight:      Height:        Wt Readings from Last 3 Encounters:  04/06/19 78 kg  07/24/12 93 kg     Intake/Output Summary (Last 24 hours) at 04/19/2019  1311 Last data filed at 04/19/2019 0600 Gross per 24 hour  Intake --  Output 1600 ml  Net -1600 ml    Physical Exam Gen Exam:Alert awake-not in any distress HEENT:atraumatic, normocephalic Chest: B/L clear to auscultation anteriorly CVS:S1S2 regular Abdomen:soft non tender, non distended Extremities:no edema Neurology: Non focal Skin: no rashl Skin: See pictures below (taken on 1/27)                      Data Review:    CBC Recent Labs  Lab 04/13/19 0053 04/13/19 0053 04/14/19 0440 04/14/19 0440 04/15/19 1255 04/16/19 0755 04/17/19 0155 04/18/19 0238 04/19/19 0135  WBC 14.4*   < > 13.2*   < > 18.1* 14.2* 17.1* 14.4* 13.6*  HGB 14.4   < > 14.1   < > 16.3 15.4 14.7 14.1 14.0  HCT 42.7   < >  42.2   < > 48.1 46.4 45.1 42.1 41.6  PLT 274   < > 261   < > 279 239 228 233 227  MCV 89.7   < > 89.8   < > 90.4 91.3 91.7 90.7 90.8  MCH 30.3   < > 30.0   < > 30.6 30.3 29.9 30.4 30.6  MCHC 33.7   < > 33.4   < > 33.9 33.2 32.6 33.5 33.7  RDW 13.9   < > 13.9   < > 14.2 14.0 14.2 14.1 14.1  LYMPHSABS 0.4*  --  0.5*  --   --   --   --   --   --   MONOABS 0.3  --  0.4  --   --   --   --   --   --   EOSABS 0.1  --  0.0  --   --   --   --   --   --   BASOSABS 0.1  --  0.0  --   --   --   --   --   --    < > = values in this interval not displayed.    Chemistries  Recent Labs  Lab 04/15/19 1255 04/16/19 0755 04/17/19 0155 04/18/19 0238 04/19/19 0135  NA 133* 137 135 136 133*  K 5.0 5.1 4.9 4.6 4.0  CL 97* 100 99 102 102  CO2 24 28 26 26 23   GLUCOSE 181* 98 117* 110* 116*  BUN 33* 36* 32* 30* 24*  CREATININE 1.00 0.94 0.78 0.83 0.70  CALCIUM 8.6* 8.6* 8.5* 8.4* 8.3*  MG  --   --   --   --  2.1  AST 21 20 23 18 19   ALT 16 18 18 15 18   ALKPHOS 99 85 96 78 71  BILITOT 1.5* 1.7* 0.8 0.9 1.7*   ------------------------------------------------------------------------------------------------------------------ No results for input(s): CHOL, HDL, LDLCALC,  TRIG, CHOLHDL, LDLDIRECT in the last 72 hours.  Lab Results  Component Value Date   HGBA1C 7.1 (H) 04/06/2019   ------------------------------------------------------------------------------------------------------------------ No results for input(s): TSH, T4TOTAL, T3FREE, THYROIDAB in the last 72 hours.  Invalid input(s): FREET3 ------------------------------------------------------------------------------------------------------------------ Recent Labs    04/18/19 0238 04/19/19 0135  FERRITIN 382* 369*    Coagulation profile No results for input(s): INR, PROTIME in the last 168 hours.  Recent Labs    04/18/19 0238 04/19/19 0135  DDIMER 9.83* 7.21*    Cardiac Enzymes No results for input(s): CKMB, TROPONINI, MYOGLOBIN in the last 168 hours.  Invalid input(s): CK ------------------------------------------------------------------------------------------------------------------    Component Value Date/Time   BNP 58.7 04/17/2019 0155    Micro Results Recent Results (from the past 240 hour(s))  MRSA PCR Screening     Status: None   Collection Time: 04/17/19  4:17 PM   Specimen: Nasal Mucosa; Nasopharyngeal  Result Value Ref Range Status   MRSA by PCR NEGATIVE NEGATIVE Final    Comment:        The GeneXpert MRSA Assay (FDA approved for NASAL specimens only), is one component of a comprehensive MRSA colonization surveillance program. It is not intended to diagnose MRSA infection nor to guide or monitor treatment for MRSA infections. Performed at [facility], [place] [address], [place],  [address]   Fungus culture, blood     Status: None (Preliminary result)   Collection Time: 04/18/19  3:00 PM   Specimen: BLOOD  Result Value Ref Range Status   Specimen Description  Final    BLOOD RIGHT ANTECUBITAL Performed at [facility] [address], [place], [place] [address]    Special Requests   Final    BOTTLES DRAWN  AEROBIC ONLY Blood Culture adequate volume Performed at [facility] [address] [place], [place] [address]    Culture   Final    NO GROWTH < 12 HOURS Performed at [facility] [address] [place], [place] [address]    Report Status PENDING  Incomplete  Fungus culture, blood     Status: None (Preliminary result)   Collection Time: 04/18/19  3:05 PM   Specimen: BLOOD RIGHT HAND  Result Value Ref Range Status   Specimen Description   Final    BLOOD RIGHT HAND Performed at [facility] [address] [place], [place] [address]    Special Requests   Final    BOTTLES DRAWN AEROBIC ONLY Blood Culture adequate volume Performed at [facility] [address] [place], [place] [address]    Culture   Final    NO GROWTH < 12 HOURS Performed at [facility] [address] [place], [place] [address]    Report Status PENDING  Incomplete    Radiology Reports CT ANGIO CHEST PE W OR WO CONTRAST  Result Date: 04/09/2019 CLINICAL DATA:  COVID pneumonia, hypoxia, shortness of breath and elevated D-dimer. EXAM: CT ANGIOGRAPHY CHEST WITH CONTRAST TECHNIQUE: Multidetector CT imaging of the chest was performed using the standard protocol during bolus administration of intravenous contrast. Multiplanar CT image reconstructions and MIPs were obtained to evaluate the vascular anatomy. CONTRAST:  7mL OMNIPAQUE IOHEXOL 350 MG/ML SOLN COMPARISON:  Chest x-ray on 04/05/2019 FINDINGS: Cardiovascular: The pulmonary arteries are well opacified. There is no evidence pulmonary embolism. Central pulmonary arteries are normal in caliber. The heart size is normal. The thoracic aorta is normal in caliber. Trace amount of pericardial fluid. Calcified plaque is noted of the coronary arteries primarily in the distribution of the LAD. Mediastinum/Nodes: Mildly prominent right hilar nodal tissue measuring up to 1.5 cm. No evidence of enlarged  mediastinal or axillary lymph nodes. Lungs/Pleura: Severe airspace disease is seen throughout all lobes of both lungs in a ground-glass pattern. There is an associated very small right pleural effusion. No pneumothorax. Upper Abdomen: No acute abnormality. Musculoskeletal: No chest wall abnormality. No acute or significant osseous findings. Review of the MIP images confirms the above findings. IMPRESSION: 1. No evidence of pulmonary embolism. 2. Severe airspace disease throughout all lobes of both lungs in a ground-glass pattern. Findings are consistent with severe bilateral COVID-19 pneumonia. 3. Mildly prominent right hilar nodal tissue measuring up to 1.5 cm in short axis. This is likely reactive. 4. Coronary artery disease with calcified plaque in the distribution of the LAD. Electronically Signed   By: Aletta Edouard M.D.   On: 04/09/2019 08:58   DG Chest Port 1 View  Result Date: 04/16/2019 CLINICAL DATA:  74 year old male with a history of COVID pneumonia EXAM: PORTABLE CHEST 1 VIEW COMPARISON:  CT 04/09/2019, plain film 04/05/2019 FINDINGS: Cardiomediastinal silhouette unchanged in size and contour. Similar appearance of low lung volumes and mixed airspace and interstitial opacities of the bilateral lungs. No pneumothorax or pleural effusion. IMPRESSION: Unchanged appearance of the lungs with mixed interstitial and airspace disease bilateral compatible with multifocal pneumonia Electronically Signed   By: Corrie Mckusick D.O.   On: 04/16/2019 07:48   DG Chest [facility]  Result Date: 04/05/2019 CLINICAL DATA:  COVID positive low oxygen level EXAM: PORTABLE CHEST 1 VIEW COMPARISON:  None. FINDINGS: Bilateral ground-glass opacities and consolidations. No pleural effusion. Normal heart size. Aortic atherosclerosis. No pneumothorax. IMPRESSION: Bilateral ground-glass opacities and consolidations consistent with bilateral pneumonia. Electronically Signed   By: Donavan Foil M.D.   On: 04/05/2019 16:47    VAS [place] LOWER EXTREMITY VENOUS (DVT)  Result Date: 04/09/2019  Lower Venous Study Indications: Covid positive with worsening positive d-Dimer and hypoxia.  Anticoagulation: Lovenox. Comparison Study: No priors. Performing Technologist: Oda Cogan RDMS, RVT  Examination Guidelines: A complete evaluation includes B-mode imaging, spectral Doppler, color Doppler, and power Doppler as needed of all accessible portions of each vessel. Bilateral testing is considered an integral part of a complete examination. Limited examinations for reoccurring indications may be performed as noted.  +---------+---------------+---------+-----------+----------+--------------+ RIGHT    CompressibilityPhasicitySpontaneityPropertiesThrombus Aging +---------+---------------+---------+-----------+----------+--------------+ CFV      Full           Yes      Yes                                 +---------+---------------+---------+-----------+----------+--------------+ SFJ      Full                                                        +---------+---------------+---------+-----------+----------+--------------+ FV Prox  Full                                                        +---------+---------------+---------+-----------+----------+--------------+ FV Mid   Full                                                        +---------+---------------+---------+-----------+----------+--------------+ FV DistalFull                                                        +---------+---------------+---------+-----------+----------+--------------+ PFV      Full                                                        +---------+---------------+---------+-----------+----------+--------------+ POP      Full           Yes      Yes                                 +---------+---------------+---------+-----------+----------+--------------+ PTV      Full                                                         +---------+---------------+---------+-----------+----------+--------------+  PERO     Full                                                        +---------+---------------+---------+-----------+----------+--------------+   +---------+---------------+---------+-----------+----------+--------------+ LEFT     CompressibilityPhasicitySpontaneityPropertiesThrombus Aging +---------+---------------+---------+-----------+----------+--------------+ CFV      Full           Yes      Yes                                 +---------+---------------+---------+-----------+----------+--------------+ SFJ      Full                                                        +---------+---------------+---------+-----------+----------+--------------+ FV Prox  Full                                                        +---------+---------------+---------+-----------+----------+--------------+ FV Mid   Full                                                        +---------+---------------+---------+-----------+----------+--------------+ FV DistalFull                                                        +---------+---------------+---------+-----------+----------+--------------+ PFV      Full                                                        +---------+---------------+---------+-----------+----------+--------------+ POP      Full           Yes      Yes                                 +---------+---------------+---------+-----------+----------+--------------+ PTV      Full                                                        +---------+---------------+---------+-----------+----------+--------------+ PERO     Partial                                      Acute          +---------+---------------+---------+-----------+----------+--------------+  Soleal   Full                                         Acute           +---------+---------------+---------+-----------+----------+--------------+     Summary: Right: There is no evidence of deep vein thrombosis in the lower extremity. No cystic structure found in the popliteal fossa. Left: Findings consistent with acute deep vein thrombosis involving the left peroneal veins, and left soleal veins. Short segment of localized thrombus in the peroneal and soleal veins in the mid segment.  *See table(s) above for measurements and observations. Electronically signed by Ruta Hinds MD on 04/09/2019 at 2:56:25 PM.    Final

## 2019-04-20 ENCOUNTER — Inpatient Hospital Stay (HOSPITAL_COMMUNITY): Payer: Medicare HMO

## 2019-04-20 ENCOUNTER — Other Ambulatory Visit: Payer: Self-pay

## 2019-04-20 LAB — CBC
HCT: 47.4 % (ref 39.0–52.0)
Hemoglobin: 15 g/dL (ref 13.0–17.0)
MCH: 30.7 pg (ref 26.0–34.0)
MCHC: 31.6 g/dL (ref 30.0–36.0)
MCV: 97.1 fL (ref 80.0–100.0)
Platelets: 293 10*3/uL (ref 150–400)
RBC: 4.88 MIL/uL (ref 4.22–5.81)
RDW: 14.7 % (ref 11.5–15.5)
WBC: 10.9 10*3/uL — ABNORMAL HIGH (ref 4.0–10.5)
nRBC: 0 % (ref 0.0–0.2)

## 2019-04-20 LAB — GLUCOSE, CAPILLARY
Glucose-Capillary: 282 mg/dL — ABNORMAL HIGH (ref 70–99)
Glucose-Capillary: 220 mg/dL — ABNORMAL HIGH (ref 70–99)
Glucose-Capillary: 149 mg/dL — ABNORMAL HIGH (ref 70–99)
Glucose-Capillary: 213 mg/dL — ABNORMAL HIGH (ref 70–99)
Glucose-Capillary: 190 mg/dL — ABNORMAL HIGH (ref 70–99)
Glucose-Capillary: 227 mg/dL — ABNORMAL HIGH (ref 70–99)

## 2019-04-20 LAB — ACID FAST SMEAR (AFB, MYCOBACTERIA): Acid Fast Smear: NEGATIVE

## 2019-04-20 LAB — COMPREHENSIVE METABOLIC PANEL
ALT: 18 U/L (ref 0–44)
AST: 24 U/L (ref 15–41)
Albumin: 2.9 g/dL — ABNORMAL LOW (ref 3.5–5.0)
Alkaline Phosphatase: 72 U/L (ref 38–126)
Anion gap: 8 (ref 5–15)
BUN: 23 mg/dL (ref 8–23)
CO2: 23 mmol/L (ref 22–32)
Calcium: 8.3 mg/dL — ABNORMAL LOW (ref 8.9–10.3)
Chloride: 103 mmol/L (ref 98–111)
Creatinine, Ser: 0.67 mg/dL (ref 0.61–1.24)
GFR calc Af Amer: >60 mL/min (ref >60)
GFR calc non Af Amer: >60 mL/min (ref >60)
Glucose, Bld: 112 mg/dL — ABNORMAL HIGH (ref 70–99)
Potassium: 4.4 mmol/L (ref 3.5–5.1)
Sodium: 134 mmol/L — ABNORMAL LOW (ref 135–145)
Total Bilirubin: 1.2 mg/dL (ref 0.3–1.2)
Total Protein: 5.5 g/dL — ABNORMAL LOW (ref 6.5–8.1)

## 2019-04-20 LAB — MAGNESIUM: Magnesium: 2.1 mg/dL (ref 1.7–2.4)

## 2019-04-20 LAB — FERRITIN: Ferritin: 360 ng/mL — ABNORMAL HIGH (ref 24–336)

## 2019-04-20 LAB — C-REACTIVE PROTEIN: CRP: 0.7 mg/dL (ref <1.0)

## 2019-04-20 LAB — D-DIMER, QUANTITATIVE: D-Dimer, Quant: 7.14 ug/mL-FEU — ABNORMAL HIGH (ref 0.00–0.50)

## 2019-04-20 MED ORDER — IOHEXOL 350 MG/ML SOLN
100.0000 mL | Freq: Once | INTRAVENOUS | Status: AC | PRN
  Administered 2019-04-20: 14:00:00 100 mL via INTRAVENOUS

## 2019-04-20 NOTE — Plan of Care (Signed)
  Problem: Education: Goal: Knowledge of General Education information will improve Description: Including pain rating scale, medication(s)/side effects and non-pharmacologic comfort measures Outcome: Progressing   Problem: Nutrition: Goal: Adequate nutrition will be maintained Outcome: Progressing   Problem: Safety: Goal: Ability to remain free from injury will improve Outcome: Progressing   

## 2019-04-20 NOTE — Plan of Care (Signed)
  Problem: Safety: Goal: Ability to remain free from injury will improve Outcome: Progressing   Problem: Activity: Goal: Risk for activity intolerance will decrease Outcome: Not Progressing   Problem: Skin Integrity: Goal: Risk for impaired skin integrity will decrease Outcome: Not Progressing

## 2019-04-20 NOTE — Progress Notes (Signed)
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 Empirically started on lipo-ampho plus serology and path sent. Awaiting results. He underwent C/A/P CT that showed ongoing diffuse interstitial bilateral airspace disease. No significant diffuse LN and mild sinusitis on NCHCT. He remains afebrile. Labs reveal elevated LDH, cr stable with day 3 of lipo ampho  1. Persistent diffuse interstitial and airspace disease in both lungs. The infiltrates demonstrate progressive consolidation versus the more ground-glass appearance on the prior study. 2. Stable mediastinal and hilar lymph nodes. 3. No acute abdominal/pelvic findings, mass lesions or adenopathy.  -recommend to continue empiric antifungal therapy -await fungal serology, antigen testing and pathology to help guide next course of treatment - will see in him in person tomorrow.  Dustin Diaz [facility] [phone]

## 2019-04-20 NOTE — Progress Notes (Signed)
Occupational Therapy Treatment Patient Details Name: Dustin Diaz MRN: [id number] DOB: 04-21-1945 Today's Date: 04/20/2019    History of present illness 74 year old male admitted with Covid /Pneumonia. PMH includes DM, HTN, hyperlipidemia, polymyalgia rheumatica.   OT comments  Patient at edge of bed with nursing on arrival.  He stated he wanted to wash up and brush teeth.  Patient was feeling "wiped out" during session and was unable to stand for more than a minute.  Remained on 7L throughout session and dropped from SpO2 91 at rest to 83 with standing.  Required min guard with mobility and completed grooming while seated with set up.  Educated on importance of sitting upright in chair and pursed lip breathing.  Completed flutter valve and incentive spirometer.  Will continue to follow acutely with OT.     Follow Up Recommendations  Supervision - Intermittent;No OT follow up    Equipment Recommendations  None recommended by OT    Recommendations for Other Services      Precautions / Restrictions Precautions Precautions: Fall Restrictions Weight Bearing Restrictions: No       Mobility Bed Mobility               General bed mobility comments: Up in chair  Transfers Overall transfer level: Needs assistance Equipment used: Rolling walker (2 wheeled) Transfers: Sit to/from [facility] Sit to Stand: Min guard Stand pivot transfers: Min guard            Balance Overall balance assessment: Needs assistance Sitting-balance support: Feet supported Sitting balance-Leahy Scale: Good     Standing balance support: During functional activity Standing balance-Leahy Scale: Fair                             ADL either performed or assessed with clinical judgement   ADL Overall ADL's : Needs assistance/impaired     Grooming: Set up;Sitting   Upper Body Bathing: Set up;Sitting   Lower Body Bathing: Min guard;Sit to/from stand        Lower Body Dressing: Min guard;Sit to/from stand               Functional mobility during ADLs: Min guard;Rolling walker General ADL Comments: Very tired today, could not tolerate standing more than a min     Vision       Perception     Praxis      Cognition Arousal/Alertness: Lethargic Behavior During Therapy: Flat affect Overall Cognitive Status: Within Functional Limits for tasks assessed                                          Exercises Other Exercises Other Exercises: Pursed lip breathing Other Exercises: x10 flutter valve Other Exercises: x10 incentive spirometer   Shoulder Instructions       General Comments      Pertinent Vitals/ Pain       Pain Assessment: No/denies pain  Home Living                                          Prior Functioning/Environment              Frequency  Min 3X/week        Progress  Toward Goals  OT Goals(current goals can now be found in the care plan section)  Progress towards OT goals: Progressing toward goals  Acute Rehab OT Goals Patient Stated Goal: Go back home OT Goal Formulation: With patient Time For Goal Achievement: 04/26/19 Potential to Achieve Goals: Good  Plan Discharge plan remains appropriate    Co-evaluation                 AM-PAC OT "6 Clicks" Daily Activity     Outcome Measure   Help from another person eating meals?: None Help from another person taking care of personal grooming?: A Little Help from another person toileting, which includes using toliet, bedpan, or urinal?: A Little Help from another person bathing (including washing, rinsing, drying)?: A Little Help from another person to put on and taking off regular upper body clothing?: A Little Help from another person to put on and taking off regular lower body clothing?: A Little 6 Click Score: 19    End of Session Equipment Utilized During Treatment: Oxygen;Rolling walker  OT  Visit Diagnosis: Unsteadiness on feet (R26.81);Muscle weakness (generalized) (M62.81)   Activity Tolerance Patient limited by fatigue   Patient Left in chair;with call bell/phone within reach;with chair alarm set   Nurse Communication Mobility status        Time: [id number] OT Time Calculation (min): 43 min  Charges: OT General Charges $OT Visit: 1 Visit OT Treatments $Self Care/Home Management : 38-52 mins  Dustin Diaz, OTR/L    Dustin Diaz 04/20/2019, 1:55 PM

## 2019-04-20 NOTE — Progress Notes (Signed)
Pharmacy Antibiotic Note  Dustin Diaz is a 74 y.o. 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 Noted risk factors of chronic prednisone/Actemra doses 1/15 and 1/16.    Pharmacy has been consulted for amphotericin B dosing.   Scr today is stable at 0.67- Will maintain fluid boluses at 500 mL pre and post amphotericin B dose. Magnesium is stable at 2.1. Potassium is up to 4.4. Will hold replacement today and observe.   Potassium and Magnesium will be monitored daily and replaced by pharmacy per protocol.  Plan: Liposomal amphotericin B 3 mg/kg (230 mg) every 24 hours  Normal saline 500 mL 1 hour prior to dose and immediately after Dextrose flushes between saline boluses and amphotericin B  Daily K and Mg  Pharmacy will replace per protocol     Height: 5\' 10"  (177.8 cm) Weight: 171 lb 15.3 oz (78 kg) IBW/kg (Calculated) : 73  Temp (24hrs), Avg:98.2 F (36.8 C), Min:98.1 F (36.7 C), Max:98.3 F (36.8 C)  Recent Labs  Lab 04/16/19 0755 04/17/19 0155 04/18/19 0238 04/19/19 0135 04/20/19 0200  WBC 14.2* 17.1* 14.4* 13.6* 10.9*  CREATININE 0.94 0.78 0.83 0.70 0.67    Estimated Creatinine Clearance: 86.2 mL/min (by C-G formula based on SCr of 0.67 mg/dL).    Allergies  Allergen Reactions  . Codeine       Thank you for allowing pharmacy to be a part of this patient's care.  Jimmy Footman, PharmD, BCPS, BCIDP Infectious Diseases Clinical Pharmacist Phone: [phone] 04/20/2019 8:11 AM

## 2019-04-20 NOTE — Progress Notes (Addendum)
PROGRESS NOTE                                                                                                                                                                                                             Patient Demographics:    Dustin Diaz, is a 74 y.o. male, DOB - 12-12-1945, [id number]  Outpatient Primary MD for the patient is Lavone Orn, MD   Admit date - 04/05/2019   LOS - 40  Chief Complaint  Patient presents with  . Covid positive  . low O2       Brief Narrative: Patient is a 74 y.o. 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  Hospital course complicated by slow improvement in hypoxia,lower extremity DVT, and nodular ulcerated skin lesions raising concern for invasive fungal infection.  See below for further details.   Subjective:   Remains on around 2-3 L at rest-but with minimal movement-requires around 10 L.  He still feels dizzy when he sometimes stands up   Assessment  & Plan :   Acute Hypoxic Resp Failure due to Covid 19 Viral pneumonia: Slowly improved-down to just 2 L at rest this morning-but gets very dyspneic with minimal movement.  Has required around 10-15 L of oxygen with ambulation-in the past few sessions with physical therapy.  Has completed a course of remdesivir-and has been slowly weaned back to his usual home regimen of prednisone.  Fever: afebrile  O2 requirements:  SpO2: 95 % O2 Flow Rate (L/min): 2 L/min FiO2 (%): 100 %   COVID-19 Labs: Recent Labs    04/18/19 0238 04/18/19 2005 04/19/19 0135 04/20/19 0200  DDIMER 9.83*  --  7.21* 7.14*  FERRITIN 382*  --  369* 360*  LDH  --  323*  --   --   CRP 0.6  --  0.7 0.7       Component Value Date/Time   BNP 58.7 04/17/2019 0155    Recent  Labs  Lab 04/14/19 0440 04/16/19 0755  PROCALCITON <0.10 <0.10    No results found for: SARSCOV2NAA   COVID-19 Medications: Decadron: 1/14>>1/23 Remdesivir: 1/14>> 1/18 Actemra: 1/15 x 1, 1/16 x 1  Antibiotics: Vancomycin 1/26>> 1/27  Cefepime 1/26>> 1/27  Prone/Incentive Spirometry: encouraged  incentive spirometry use 3-4/hour.  DVT Prophylaxis  : Eliquis  Nodular cavitary skin lesions in the right neck area: A few days back-developed what looked like papular lesions on the right side of his neck that progressed to a nodular lesion that has since ulcerated in the center.  Given his chronic prednisone use- Actemra use-concern for invasive fungal infection.  Serum cryptococcal antigen negative.  Punch biopsy done on 1/28-awaiting biopsy/microbiology.  CT head/chest/abdomen ordered as recommended by ID.  Following-patient currently on IV amphotericin.  Antifungal agents: Amphotericin B 1/27>>  Fungal blood cultures on 1/27: Negative so far  Pending tests: Right neck tissue biopsy, aerobic/anaerobic cultures/fungal and AFB cultures Urine histoplasma antigen, blastomycosis antigen, histoplasma galactomannan  Left lower extremity DVT: Continue Eliquis-CTA chest on 1/18 without PE.  DM-2: CBG stable-continue Lantus 6 units and NovoLog.  Follow.  CBG (last 3)  Recent Labs    04/19/19 1636 04/19/19 2122 04/20/19 0721  GLUCAP 282* 220* 149*    HTN: BP controlled-have discontinued all antihypertensives on 1/26-due to dizziness upon standing.  Dyslipidemia: Continue statin  History of PMR: Continue prednisone-dose gradually taper down back to his usual home regimen of 3 mg.  Right hilar lymphadenopathy: Likely reactive-repeat imaging in 3 to 6 months as outpatient.  Nutrition Problem: Nutrition Problem: Increased nutrient needs Etiology: catabolic [id number] PNA) Signs/Symptoms: estimated needs Interventions: Hormel Shake   Consults  :  None  Procedures  :     1/28>> Punch biopsy right neck lesion by general surgery  ABG: No results found for: PHART, PCO2ART, PO2ART, HCO3, TCO2, ACIDBASEDEF, O2SAT  Vent Settings: N/A   Condition -Guarded  Family Communication  :  Spouse (retired Therapist, sports) updated over the phone 1/29  Code Status :  Full Code  Diet :  Diet Order            Diet heart healthy/carb modified Room service appropriate? Yes; Fluid consistency: Thin  Diet effective now               Disposition Plan  :  Remain hospitalized-Home when hypoxia improves.  Barriers to discharge: Hypoxia requiring O2 supplementation/remains on IV amphotericin B (see assessment/plan section for further details)  Antimicorbials  :    Anti-infectives (From admission, onward)   Start     Dose/Rate Route Frequency Ordered Stop   04/18/19 1800  amphotericin B liposome (AMBISOME) 230 mg in dextrose 5 % 500 mL IVPB     3 mg/kg  78 kg 278.8 mL/hr over 120 Minutes Intravenous Every 24 hours 04/18/19 1447     04/18/19 1000  vancomycin (VANCOCIN) IVPB 1000 mg/200 mL premix  Status:  Discontinued     1,000 mg 200 mL/hr over 60 Minutes Intravenous Every 12 hours 04/17/19 1400 04/18/19 1138   04/17/19 1500  ceFEPIme (MAXIPIME) 2 g in sodium chloride 0.9 % 100 mL IVPB  Status:  Discontinued     2 g 200 mL/hr over 30 Minutes Intravenous Every 8 hours 04/17/19 1359 04/18/19 1335   04/17/19 1430  vancomycin (VANCOREADY) IVPB 1500 mg/300 mL     1,500 mg 150 mL/hr over 120 Minutes Intravenous  Once 04/17/19 1400 04/17/19 1825   04/06/19 1000  remdesivir 100 mg in sodium chloride 0.9 % 100 mL IVPB  Status:  Discontinued     100 mg 200 mL/hr over 30 Minutes Intravenous Daily 04/05/19 1919 04/05/19 1919   04/06/19 1000  remdesivir 100 mg in sodium chloride 0.9 % 100  mL IVPB     100 mg 200 mL/hr over 30 Minutes Intravenous Daily 04/05/19 1919 04/09/19 0948   04/06/19 0400  azithromycin (ZITHROMAX) 500 mg in sodium chloride 0.9 % 250 mL IVPB  Status:  Discontinued      500 mg 250 mL/hr over 60 Minutes Intravenous Every 24 hours 04/06/19 0242 04/06/19 0725   04/06/19 0300  cefTRIAXone (ROCEPHIN) 1 g in sodium chloride 0.9 % 100 mL IVPB  Status:  Discontinued     1 g 200 mL/hr over 30 Minutes Intravenous Every 24 hours 04/06/19 0242 04/06/19 0725   04/05/19 2100  remdesivir 200 mg in sodium chloride 0.9% 250 mL IVPB     200 mg 580 mL/hr over 30 Minutes Intravenous Once 04/05/19 1919 04/05/19 2200   04/05/19 1918  remdesivir 200 mg in sodium chloride 0.9% 250 mL IVPB  Status:  Discontinued     200 mg 580 mL/hr over 30 Minutes Intravenous Once 04/05/19 1919 04/05/19 1919      Inpatient Medications  Scheduled Meds: . apixaban  5 mg Oral BID  . vitamin C  500 mg Oral Daily  . atorvastatin  10 mg Oral Daily  . dextrose  10 mL Intravenous Q24H  . dextrose  10 mL Intravenous Q24H  . fluticasone  2 spray Each Nare Daily  . insulin aspart  0-15 Units Subcutaneous TID WC  . insulin aspart  0-5 Units Subcutaneous QHS  . insulin aspart  4 Units Subcutaneous TID WC  . insulin glargine  6 Units Subcutaneous QHS  . Ipratropium-Albuterol  1 puff Inhalation Q6H  . latanoprost  1 drop Both Eyes QHS  . oxymetazoline  1 spray Each Nare BID  . pantoprazole  40 mg Oral Daily  . predniSONE  3 mg Oral Daily  . sodium chloride  500 mL Intravenous Q24H  . sodium chloride  500 mL Intravenous Q24H  . sodium chloride flush  10-40 mL Intracatheter Q12H  . zinc sulfate  220 mg Oral Daily   Continuous Infusions: . amphotericin  B  Liposome (AMBISOME) ADULT IV Stopped (04/19/19 2017)   PRN Meds:.acetaminophen, acetaminophen, ALPRAZolam, chlorpheniramine-HYDROcodone, diphenhydrAMINE **OR** diphenhydrAMINE, guaiFENesin-dextromethorphan, lip balm, magic mouthwash w/lidocaine, meperidine (DEMEROL) injection, ondansetron **OR** ondansetron (ZOFRAN) IV, sodium chloride, sodium chloride flush   Time Spent in minutes  25  See all Orders from today for further  details   Oren Binet M.D on 04/20/2019 at 11:02 AM  To page go to [email or web address] - use universal password  [facility] -  Office  [phone]    Objective:   Vitals:   04/19/19 1948 04/20/19 0330 04/20/19 0515 04/20/19 0722  BP: 126/67 140/71  (!) 146/71  Pulse: 83 77  79  Resp: 20 20  17   Temp: 98.1 F (36.7 C) 98.3 F (36.8 C)  97.6 F (36.4 C)  TempSrc: Oral Oral  Oral  SpO2: 98% 97% (!) 85% 95%  Weight:      Height:        Wt Readings from Last 3 Encounters:  04/06/19 78 kg  07/24/12 93 kg     Intake/Output Summary (Last 24 hours) at 04/20/2019 1102 Last data filed at 04/20/2019 0500 Gross per 24 hour  Intake 1542.62 ml  Output 1400 ml  Net 142.62 ml    Physical Exam Gen Exam:Alert awake-not in any distress HEENT:atraumatic, normocephalic Chest: B/L clear to auscultation anteriorly CVS:S1S2 regular Abdomen:soft non tender, non distended Extremities:no edema Neurology: Non focal  Skin: See pictures below (taken on  1/27)                      Data Review:    CBC Recent Labs  Lab 04/14/19 0440 04/15/19 1255 04/16/19 0755 04/17/19 0155 04/18/19 0238 04/19/19 0135 04/20/19 0200  WBC 13.2*   < > 14.2* 17.1* 14.4* 13.6* 10.9*  HGB 14.1   < > 15.4 14.7 14.1 14.0 15.0  HCT 42.2   < > 46.4 45.1 42.1 41.6 47.4  PLT 261   < > 239 228 233 227 293  MCV 89.8   < > 91.3 91.7 90.7 90.8 97.1  MCH 30.0   < > 30.3 29.9 30.4 30.6 30.7  MCHC 33.4   < > 33.2 32.6 33.5 33.7 31.6  RDW 13.9   < > 14.0 14.2 14.1 14.1 14.7  LYMPHSABS 0.5*  --   --   --   --   --   --   MONOABS 0.4  --   --   --   --   --   --   EOSABS 0.0  --   --   --   --   --   --   BASOSABS 0.0  --   --   --   --   --   --    < > = values in this interval not displayed.    Chemistries  Recent Labs  Lab 04/16/19 0755 04/17/19 0155 04/18/19 0238 04/19/19 0135 04/20/19 0200  NA 137 135 136 133* 134*  K 5.1 4.9 4.6 4.0 4.4  CL 100 99 102 102 103  CO2 28 26  26 23 23   GLUCOSE 98 117* 110* 116* 112*  BUN 36* 32* 30* 24* 23  CREATININE 0.94 0.78 0.83 0.70 0.67  CALCIUM 8.6* 8.5* 8.4* 8.3* 8.3*  MG  --   --   --  2.1 2.1  AST 20 23 18 19 24   ALT 18 18 15 18 18   ALKPHOS 85 96 78 71 72  BILITOT 1.7* 0.8 0.9 1.7* 1.2   ------------------------------------------------------------------------------------------------------------------ No results for input(s): CHOL, HDL, LDLCALC, TRIG, CHOLHDL, LDLDIRECT in the last 72 hours.  Lab Results  Component Value Date   HGBA1C 7.1 (H) 04/06/2019   ------------------------------------------------------------------------------------------------------------------ No results for input(s): TSH, T4TOTAL, T3FREE, THYROIDAB in the last 72 hours.  Invalid input(s): FREET3 ------------------------------------------------------------------------------------------------------------------ Recent Labs    04/19/19 0135 04/20/19 0200  FERRITIN 369* 360*    Coagulation profile No results for input(s): INR, PROTIME in the last 168 hours.  Recent Labs    04/19/19 0135 04/20/19 0200  DDIMER 7.21* 7.14*    Cardiac Enzymes No results for input(s): CKMB, TROPONINI, MYOGLOBIN in the last 168 hours.  Invalid input(s): CK ------------------------------------------------------------------------------------------------------------------    Component Value Date/Time   BNP 58.7 04/17/2019 0155    Micro Results Recent Results (from the past 240 hour(s))  MRSA PCR Screening     Status: None   Collection Time: 04/17/19  4:17 PM   Specimen: Nasal Mucosa; Nasopharyngeal  Result Value Ref Range Status   MRSA by PCR NEGATIVE NEGATIVE Final    Comment:        The GeneXpert MRSA Assay (FDA approved for NASAL specimens only), is one component of a comprehensive MRSA colonization surveillance program. It is not intended to diagnose MRSA infection nor to guide or monitor treatment for MRSA infections. Performed  at [facility], [place] [address] [place], [place] [address]   Fungus culture, blood  Status: None (Preliminary result)   Collection Time: 04/18/19  3:00 PM   Specimen: BLOOD  Result Value Ref Range Status   Specimen Description   Final    BLOOD RIGHT ANTECUBITAL Performed at [facility] [address], [place], [place] [address]    Special Requests   Final    BOTTLES DRAWN AEROBIC ONLY Blood Culture adequate volume Performed at [facility] [address], [place], [place] [address]    Culture   Final    NO GROWTH 2 DAYS Performed at [facility] [address] [place], [place] [address]    Report Status PENDING  Incomplete  Fungus culture, blood     Status: None (Preliminary result)   Collection Time: 04/18/19  3:05 PM   Specimen: BLOOD RIGHT HAND  Result Value Ref Range Status   Specimen Description   Final    BLOOD RIGHT HAND Performed at [facility] [address], [place], [place] [address]    Special Requests   Final    BOTTLES DRAWN AEROBIC ONLY Blood Culture adequate volume Performed at [facility] [address], [place], [place] [address]    Culture   Final    NO GROWTH 2 DAYS Performed at [facility] [address] [place], [place] [address]    Report Status PENDING  Incomplete  Aerobic/Anaerobic Culture (surgical/deep wound)     Status: None (Preliminary result)   Collection Time: 04/19/19 11:39 AM   Specimen: Biopsy; Tissue  Result Value Ref Range Status   Specimen Description   Final    BIOPSY RT NECK Performed at [facility] [address] [place], [place] [address]    Special Requests   Final    Immunocompromised Performed at [facility], [place] [address] [place], [place] [address]    Gram Stain NO WBC SEEN NO ORGANISMS SEEN   Final   Culture   Final    NO GROWTH < 24 HOURS Performed at  [facility], [place] [address] [place], [place] [address]    Report Status PENDING  Incomplete    Radiology Reports CT ANGIO CHEST PE W OR WO CONTRAST  Result Date: 04/09/2019 CLINICAL DATA:  COVID pneumonia, hypoxia, shortness of breath and elevated D-dimer. EXAM: CT ANGIOGRAPHY CHEST WITH CONTRAST TECHNIQUE: Multidetector CT imaging of the chest was performed using the standard protocol during bolus administration of intravenous contrast. Multiplanar CT image reconstructions and MIPs were obtained to evaluate the vascular anatomy. CONTRAST:  10mL OMNIPAQUE IOHEXOL 350 MG/ML SOLN COMPARISON:  Chest x-ray on 04/05/2019 FINDINGS: Cardiovascular: The pulmonary arteries are well opacified. There is no evidence pulmonary embolism. Central pulmonary arteries are normal in caliber. The heart size is normal. The thoracic aorta is normal in caliber. Trace amount of pericardial fluid. Calcified plaque is noted of the coronary arteries primarily in the distribution of the LAD. Mediastinum/Nodes: Mildly prominent right hilar nodal tissue measuring up to 1.5 cm. No evidence of enlarged mediastinal or axillary lymph nodes. Lungs/Pleura: Severe airspace disease is seen throughout all lobes of both lungs in a ground-glass pattern. There is an associated very small right pleural effusion. No pneumothorax. Upper Abdomen: No acute abnormality. Musculoskeletal: No chest wall abnormality. No acute or significant osseous findings. Review of the MIP images confirms the above findings. IMPRESSION: 1. No evidence of pulmonary embolism. 2. Severe airspace disease throughout all lobes of both lungs in a ground-glass pattern.  Findings are consistent with severe bilateral COVID-19 pneumonia. 3. Mildly prominent right hilar nodal tissue measuring up to 1.5 cm in short axis. This is likely reactive. 4. Coronary artery disease with calcified plaque in the distribution of the LAD. Electronically Signed   By: Aletta Edouard M.D.   On:  04/09/2019 08:58   DG Chest Port 1 View  Result Date: 04/16/2019 CLINICAL DATA:  74 year old male with a history of COVID pneumonia EXAM: PORTABLE CHEST 1 VIEW COMPARISON:  CT 04/09/2019, plain film 04/05/2019 FINDINGS: Cardiomediastinal silhouette unchanged in size and contour. Similar appearance of low lung volumes and mixed airspace and interstitial opacities of the bilateral lungs. No pneumothorax or pleural effusion. IMPRESSION: Unchanged appearance of the lungs with mixed interstitial and airspace disease bilateral compatible with multifocal pneumonia Electronically Signed   By: Corrie Mckusick D.O.   On: 04/16/2019 07:48   DG Chest Port 1 View  Result Date: 04/05/2019 CLINICAL DATA:  COVID positive low oxygen level EXAM: PORTABLE CHEST 1 VIEW COMPARISON:  None. FINDINGS: Bilateral ground-glass opacities and consolidations. No pleural effusion. Normal heart size. Aortic atherosclerosis. No pneumothorax. IMPRESSION: Bilateral ground-glass opacities and consolidations consistent with bilateral pneumonia. Electronically Signed   By: Donavan Foil M.D.   On: 04/05/2019 16:47   VAS [place] LOWER EXTREMITY VENOUS (DVT)  Result Date: 04/09/2019  Lower Venous Study Indications: Covid positive with worsening positive d-Dimer and hypoxia.  Anticoagulation: Lovenox. Comparison Study: No priors. Performing Technologist: Oda Cogan RDMS, RVT  Examination Guidelines: A complete evaluation includes B-mode imaging, spectral Doppler, color Doppler, and power Doppler as needed of all accessible portions of each vessel. Bilateral testing is considered an integral part of a complete examination. Limited examinations for reoccurring indications may be performed as noted.  +---------+---------------+---------+-----------+----------+--------------+ RIGHT    CompressibilityPhasicitySpontaneityPropertiesThrombus Aging +---------+---------------+---------+-----------+----------+--------------+ CFV      Full            Yes      Yes                                 +---------+---------------+---------+-----------+----------+--------------+ SFJ      Full                                                        +---------+---------------+---------+-----------+----------+--------------+ FV Prox  Full                                                        +---------+---------------+---------+-----------+----------+--------------+ FV Mid   Full                                                        +---------+---------------+---------+-----------+----------+--------------+ FV DistalFull                                                        +---------+---------------+---------+-----------+----------+--------------+  PFV      Full                                                        +---------+---------------+---------+-----------+----------+--------------+ POP      Full           Yes      Yes                                 +---------+---------------+---------+-----------+----------+--------------+ PTV      Full                                                        +---------+---------------+---------+-----------+----------+--------------+ PERO     Full                                                        +---------+---------------+---------+-----------+----------+--------------+   +---------+---------------+---------+-----------+----------+--------------+ LEFT     CompressibilityPhasicitySpontaneityPropertiesThrombus Aging +---------+---------------+---------+-----------+----------+--------------+ CFV      Full           Yes      Yes                                 +---------+---------------+---------+-----------+----------+--------------+ SFJ      Full                                                        +---------+---------------+---------+-----------+----------+--------------+ FV Prox  Full                                                         +---------+---------------+---------+-----------+----------+--------------+ FV Mid   Full                                                        +---------+---------------+---------+-----------+----------+--------------+ FV DistalFull                                                        +---------+---------------+---------+-----------+----------+--------------+ PFV      Full                                                        +---------+---------------+---------+-----------+----------+--------------+  POP      Full           Yes      Yes                                 +---------+---------------+---------+-----------+----------+--------------+ PTV      Full                                                        +---------+---------------+---------+-----------+----------+--------------+ PERO     Partial                                      Acute          +---------+---------------+---------+-----------+----------+--------------+ Soleal   Full                                         Acute          +---------+---------------+---------+-----------+----------+--------------+     Summary: Right: There is no evidence of deep vein thrombosis in the lower extremity. No cystic structure found in the popliteal fossa. Left: Findings consistent with acute deep vein thrombosis involving the left peroneal veins, and left soleal veins. Short segment of localized thrombus in the peroneal and soleal veins in the mid segment.  *See table(s) above for measurements and observations. Electronically signed by Ruta Hinds MD on 04/09/2019 at 2:56:25 PM.    Final

## 2019-04-21 LAB — GLUCOSE, CAPILLARY
Glucose-Capillary: 133 mg/dL — ABNORMAL HIGH (ref 70–99)
Glucose-Capillary: 163 mg/dL — ABNORMAL HIGH (ref 70–99)

## 2019-04-21 LAB — BASIC METABOLIC PANEL
Anion gap: 10 (ref 5–15)
BUN: 23 mg/dL (ref 8–23)
CO2: 24 mmol/L (ref 22–32)
Calcium: 8.6 mg/dL — ABNORMAL LOW (ref 8.9–10.3)
Chloride: 101 mmol/L (ref 98–111)
Creatinine, Ser: 0.67 mg/dL (ref 0.61–1.24)
GFR calc Af Amer: >60 mL/min (ref >60)
GFR calc non Af Amer: >60 mL/min (ref >60)
Glucose, Bld: 114 mg/dL — ABNORMAL HIGH (ref 70–99)
Potassium: 3.9 mmol/L (ref 3.5–5.1)
Sodium: 135 mmol/L (ref 135–145)

## 2019-04-21 LAB — HISTOPLASMA ANTIGEN, URINE: Histoplasma Antigen, urine: <0.5 (ref <0.5)

## 2019-04-21 LAB — MAGNESIUM: Magnesium: 2.1 mg/dL (ref 1.7–2.4)

## 2019-04-21 MED ORDER — POTASSIUM CHLORIDE CRYS ER 20 MEQ PO TBCR
20.0000 meq | EXTENDED_RELEASE_TABLET | Freq: Once | ORAL | Status: AC
  Administered 2019-04-21: 20 meq via ORAL
  Filled 2019-04-21: qty 1

## 2019-04-21 MED ORDER — DEXTROSE 5% FOR FLUSHING BEFORE AND AFTER AMBISOME
10.0000 mL | INTRAVENOUS | Status: DC
  Filled 2019-04-21 (×3): qty 50

## 2019-04-21 MED ORDER — SODIUM CHLORIDE 0.9 % IV BOLUS
250.0000 mL | INTRAVENOUS | Status: DC
  Administered 2019-04-21: 20:00:00 250 mL via INTRAVENOUS

## 2019-04-21 MED ORDER — MAGNESIUM HYDROXIDE 400 MG/5ML PO SUSP
30.0000 mL | Freq: Two times a day (BID) | ORAL | Status: AC
  Administered 2019-04-21 (×2): 30 mL via ORAL
  Filled 2019-04-21 (×2): qty 30

## 2019-04-21 MED ORDER — SODIUM CHLORIDE 0.9 % IV BOLUS
250.0000 mL | INTRAVENOUS | Status: DC
  Administered 2019-04-21: 16:00:00 250 mL via INTRAVENOUS

## 2019-04-21 MED ORDER — FUROSEMIDE 20 MG PO TABS
40.0000 mg | ORAL_TABLET | Freq: Once | ORAL | Status: AC
  Administered 2019-04-21: 40 mg via ORAL
  Filled 2019-04-21: qty 2

## 2019-04-21 MED ORDER — DOCUSATE SODIUM 100 MG PO CAPS
200.0000 mg | ORAL_CAPSULE | Freq: Two times a day (BID) | ORAL | Status: DC
  Administered 2019-04-21 – 2019-05-03 (×23): 200 mg via ORAL
  Filled 2019-04-21 (×23): qty 2

## 2019-04-21 NOTE — Progress Notes (Signed)
ID PROGRESS NOTE   Pt kidney function is stable while on liposomal ampho Sent off for aspergillus ag/ and fungitell assay Histo antigen, crypto antigen is negative  Awaiting path to see if signs c/w invasive fungal infection. Continue on liposomal ampho for now Anticipate that path will read slides on Monday  [facility]ts[facility] [phone]

## 2019-04-21 NOTE — Plan of Care (Signed)

## 2019-04-21 NOTE — Progress Notes (Addendum)
PROGRESS NOTE                                                                                                                                                                                                             Patient Demographics:    Dustin Diaz, is a 74 y.o. male, DOB - [birth year], [id number]  Outpatient Primary MD for the patient is Lavone Orn, MD   Admit date - 04/05/2019   LOS - 45  Chief Complaint  Patient presents with  . Covid positive  . low O2       Brief Narrative:  Patient is a 74 y.o. 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  Hospital course complicated by slow improvement in hypoxia,lower extremity DVT, and nodular ulcerated skin lesions raising concern for invasive fungal infection.  See below for further details.   Subjective:   Patient in bed, appears comfortable, denies any headache, no fever, no chest pain or pressure, improved shortness of breath , no abdominal pain. No focal weakness.   Assessment  & Plan :   Acute Hypoxic Resp Failure due to Covid 19 Viral pneumonia: Slowly improved-down to just 2 L at rest this morning-but gets very dyspneic with minimal movement.  Has required around 10-15 L of oxygen with ambulation-in the past few sessions with physical therapy.  Has completed a course of remdesivir-and has been slowly weaned back to his usual home regimen of prednisone.  Fever: afebrile  O2 requirements:  SpO2: 99 % O2 Flow Rate (L/min): 2 L/min FiO2 (%): 100 %   COVID-19 Labs: Recent Labs    04/18/19 2005 04/19/19 0135 04/20/19 0200  DDIMER  --  7.21* 7.14*  FERRITIN  --  369* 360*  LDH 323*  --   --   CRP  --  0.7 0.7       Component Value Date/Time   BNP 58.7 04/17/2019 0155    Recent  Labs  Lab 04/16/19 0755  PROCALCITON <0.10    No results found for: SARSCOV2NAA   COVID-19 Medications: Decadron: 1/14>>1/23 Remdesivir: 1/14>> 1/18 Actemra: 1/15 x 1, 1/16 x 1  Antibiotics: Vancomycin 1/26>> 1/27 Cefepime 1/26>> 1/27  Prone/Incentive Spirometry: encouraged  incentive spirometry use 3-4/hour.  Nodular cavitary skin lesions in the right neck area: A few days back-developed what looked like papular lesions on the right side of his neck that progressed to a nodular lesion that has since ulcerated in the center.  Given his chronic prednisone use- Actemra use-concern for invasive fungal infection.  Serum cryptococcal antigen negative.  Punch biopsy done on 1/28-awaiting biopsy/microbiology.  CT head/chest/abdomen ordered as recommended by ID.  Following-patient currently on IV amphotericin.  Antifungal agents: Amphotericin B 1/27>>  Fungal blood cultures on 1/27: Negative so far  Pending tests: Right neck tissue biopsy, aerobic/anaerobic cultures/fungal and AFB cultures Urine histoplasma antigen, blastomycosis antigen, histoplasma galactomannan   Left lower extremity DVT: Continue Eliquis-CTA chest on 1/18 without PE.  DM-2: CBG stable-continue Lantus 6 units and NovoLog.  Follow.  CBG (last 3)  Recent Labs    04/20/19 2120 04/21/19 0714 04/21/19 1142  GLUCAP 227* 133* 163*    HTN: BP controlled-have discontinued all antihypertensives on 1/26-due to dizziness upon standing.  Dyslipidemia: Continue statin  History of PMR: Continue prednisone-dose gradually taper down back to his usual home regimen of 3 mg.  Right hilar lymphadenopathy: Likely reactive-repeat imaging in 3 to 6 months as outpatient.  ? Pancreatic Cyst on CT - PCP guided follow up.  Nutrition Problem: Nutrition Problem: Increased nutrient needs Etiology: catabolic [id number] PNA) Signs/Symptoms: estimated needs Interventions: Hormel Shake    Consults  :  CCS,  ID  Procedures  :   1/28>> Punch biopsy right neck lesion by general surgery     Condition -Guarded  Family Communication  :  Previous MD  - Spouse (retired Therapist, sports) updated over the phone 1/29  Code Status :  Full Code  Diet :  Diet Order            Diet heart healthy/carb modified Room service appropriate? Yes; Fluid consistency: Thin  Diet effective now               Disposition Plan  :  Remain hospitalized-Home when hypoxia improves.  Barriers to discharge: Hypoxia requiring O2 supplementation/remains on IV amphotericin B (see assessment/plan section for further details)  Antimicorbials  :    Anti-infectives (From admission, onward)   Start     Dose/Rate Route Frequency Ordered Stop   04/18/19 1800  amphotericin B liposome (AMBISOME) 230 mg in dextrose 5 % 500 mL IVPB     3 mg/kg  78 kg 278.8 mL/hr over 120 Minutes Intravenous Every 24 hours 04/18/19 1447     04/18/19 1000  vancomycin (VANCOCIN) IVPB 1000 mg/200 mL premix  Status:  Discontinued     1,000 mg 200 mL/hr over 60 Minutes Intravenous Every 12 hours 04/17/19 1400 04/18/19 1138   04/17/19 1500  ceFEPIme (MAXIPIME) 2 g in sodium chloride 0.9 % 100 mL IVPB  Status:  Discontinued     2 g 200 mL/hr over 30 Minutes Intravenous Every 8 hours 04/17/19 1359 04/18/19 1335   04/17/19 1430  vancomycin (VANCOREADY) IVPB 1500 mg/300 mL     1,500 mg 150 mL/hr over 120 Minutes Intravenous  Once 04/17/19 1400 04/17/19 1825   04/06/19 1000  remdesivir 100 mg in sodium chloride 0.9 % 100 mL IVPB  Status:  Discontinued     100 mg 200 mL/hr over 30 Minutes Intravenous Daily 04/05/19 1919 04/05/19 1919   04/06/19 1000  remdesivir 100 mg in sodium chloride 0.9 % 100 mL IVPB     100 mg 200 mL/hr over 30  Minutes Intravenous Daily 04/05/19 1919 04/09/19 0948   04/06/19 0400  azithromycin (ZITHROMAX) 500 mg in sodium chloride 0.9 % 250 mL IVPB  Status:  Discontinued     500 mg 250 mL/hr over 60 Minutes Intravenous Every 24 hours  04/06/19 0242 04/06/19 0725   04/06/19 0300  cefTRIAXone (ROCEPHIN) 1 g in sodium chloride 0.9 % 100 mL IVPB  Status:  Discontinued     1 g 200 mL/hr over 30 Minutes Intravenous Every 24 hours 04/06/19 0242 04/06/19 0725   04/05/19 2100  remdesivir 200 mg in sodium chloride 0.9% 250 mL IVPB     200 mg 580 mL/hr over 30 Minutes Intravenous Once 04/05/19 1919 04/05/19 2200   04/05/19 1918  remdesivir 200 mg in sodium chloride 0.9% 250 mL IVPB  Status:  Discontinued     200 mg 580 mL/hr over 30 Minutes Intravenous Once 04/05/19 1919 04/05/19 1919     DVT Prophylaxis  : Eliquis  Inpatient Medications  Scheduled Meds: . apixaban  5 mg Oral BID  . vitamin C  500 mg Oral Daily  . atorvastatin  10 mg Oral Daily  . dextrose  10 mL Intravenous Q24H  . fluticasone  2 spray Each Nare Daily  . furosemide  40 mg Oral Once  . insulin aspart  0-15 Units Subcutaneous TID WC  . insulin aspart  0-5 Units Subcutaneous QHS  . insulin aspart  4 Units Subcutaneous TID WC  . insulin glargine  6 Units Subcutaneous QHS  . Ipratropium-Albuterol  1 puff Inhalation Q6H  . latanoprost  1 drop Both Eyes QHS  . oxymetazoline  1 spray Each Nare BID  . pantoprazole  40 mg Oral Daily  . predniSONE  3 mg Oral Daily  . zinc sulfate  220 mg Oral Daily   Continuous Infusions: . amphotericin  B  Liposome (AMBISOME) ADULT IV Stopped (04/20/19 1928)   PRN Meds:.acetaminophen, ALPRAZolam, chlorpheniramine-HYDROcodone, diphenhydrAMINE **OR** diphenhydrAMINE, guaiFENesin-dextromethorphan, lip balm, magic mouthwash w/lidocaine, meperidine (DEMEROL) injection, [DISCONTINUED] ondansetron **OR** ondansetron (ZOFRAN) IV, sodium chloride, sodium chloride flush   Time Spent in minutes  25  See all Orders from today for further details   Lala Lund M.D on 04/21/2019 at 12:29 PM  To page go to [email or web address] - use universal password  [facility] -  Office  [phone]    Objective:   Vitals:   04/20/19  1958 04/21/19 0446 04/21/19 0740 04/21/19 1142  BP: 115/65 128/66 (!) 142/67 128/66  Pulse: 75 69 75 76  Resp: 20 20 (!) 21 (!) 22  Temp: 98.4 F (36.9 C) 97.8 F (36.6 C) 97.7 F (36.5 C) 98.2 F (36.8 C)  TempSrc: Oral Oral Oral Oral  SpO2: 99% 96% 96% 99%  Weight:      Height:        Wt Readings from Last 3 Encounters:  04/06/19 78 kg  07/24/12 93 kg     Intake/Output Summary (Last 24 hours) at 04/21/2019 1229 Last data filed at 04/21/2019 0900 Gross per 24 hour  Intake 1440 ml  Output 1350 ml  Net 90 ml    Physical Exam  Awake Alert,  , No new F.N deficits, Normal affect [place].AT,PERRAL Supple Neck,No JVD, No cervical lymphadenopathy appriciated.  Symmetrical Chest wall movement, Good air movement bilaterally, +ve rales RRR,No Gallops, Rubs or new Murmurs, No Parasternal Heave +ve B.Sounds, Abd Soft, No tenderness, No organomegaly appriciated, No rebound - guarding or rigidity. Skin: See pictures below (taken on 1/27)  Data Review:    CBC Recent Labs  Lab 04/16/19 0755 04/17/19 0155 04/18/19 0238 04/19/19 0135 04/20/19 0200  WBC 14.2* 17.1* 14.4* 13.6* 10.9*  HGB 15.4 14.7 14.1 14.0 15.0  HCT 46.4 45.1 42.1 41.6 47.4  PLT 239 228 233 227 293  MCV 91.3 91.7 90.7 90.8 97.1  MCH 30.3 29.9 30.4 30.6 30.7  MCHC 33.2 32.6 33.5 33.7 31.6  RDW 14.0 14.2 14.1 14.1 14.7    Chemistries  Recent Labs  Lab 04/16/19 0755 04/16/19 0755 04/17/19 0155 04/18/19 0238 04/19/19 0135 04/20/19 0200 04/21/19 0202  NA 137   < > 135 136 133* 134* 135  K 5.1   < > 4.9 4.6 4.0 4.4 3.9  CL 100   < > 99 102 102 103 101  CO2 28   < > 26 26 23 23 24   GLUCOSE 98   < > 117* 110* 116* 112* 114*  BUN 36*   < > 32* 30* 24* 23 23  CREATININE 0.94   < > 0.78 0.83 0.70 0.67 0.67  CALCIUM 8.6*   < > 8.5* 8.4* 8.3* 8.3* 8.6*  MG  --   --   --   --  2.1 2.1 2.1  AST 20  --  23 18 19 24   --   ALT 18  --  18 15 18 18   --   ALKPHOS 85  --  96 78  71 72  --   BILITOT 1.7*  --  0.8 0.9 1.7* 1.2  --    < > = values in this interval not displayed.   ------------------------------------------------------------------------------------------------------------------ No results for input(s): CHOL, HDL, LDLCALC, TRIG, CHOLHDL, LDLDIRECT in the last 72 hours.  Lab Results  Component Value Date   HGBA1C 7.1 (H) 04/06/2019   ------------------------------------------------------------------------------------------------------------------ No results for input(s): TSH, T4TOTAL, T3FREE, THYROIDAB in the last 72 hours.  Invalid input(s): FREET3 ------------------------------------------------------------------------------------------------------------------ Recent Labs    04/19/19 0135 04/20/19 0200  FERRITIN 369* 360*    Coagulation profile No results for input(s): INR, PROTIME in the last 168 hours.  Recent Labs    04/19/19 0135 04/20/19 0200  DDIMER 7.21* 7.14*    Cardiac Enzymes No results for input(s): CKMB, TROPONINI, MYOGLOBIN in the last 168 hours.  Invalid input(s): CK ------------------------------------------------------------------------------------------------------------------    Component Value Date/Time   BNP 58.7 04/17/2019 0155    Micro Results Recent Results (from the past 240 hour(s))  MRSA PCR Screening     Status: None   Collection Time: 04/17/19  4:17 PM   Specimen: Nasal Mucosa; Nasopharyngeal  Result Value Ref Range Status   MRSA by PCR NEGATIVE NEGATIVE Final    Comment:        The GeneXpert MRSA Assay (FDA approved for NASAL specimens only), is one component of a comprehensive MRSA colonization surveillance program. It is not intended to diagnose MRSA infection nor to guide or monitor treatment for MRSA infections. Performed at [facility], [place] [address], [place], [place] [address]   Fungus culture, blood     Status: None (Preliminary result)   Collection Time:  04/18/19  3:00 PM   Specimen: BLOOD  Result Value Ref Range Status   Specimen Description   Final    BLOOD RIGHT ANTECUBITAL Performed at [facility] [address], [place], [place] [address]    Special Requests   Final    BOTTLES DRAWN AEROBIC ONLY Blood Culture adequate volume Performed at [facility], [address] [place], [place] [address]    Culture   Final    NO GROWTH 2 DAYS Performed at [facility], [place] [address] [place], [place] [address]    Report Status PENDING  Incomplete  Fungus culture, blood     Status: None (Preliminary result)   Collection Time: 04/18/19  3:05 PM   Specimen: BLOOD RIGHT HAND  Result Value Ref Range Status   Specimen Description   Final    BLOOD RIGHT HAND Performed at [facility] [address], [place], [place] [address]    Special Requests   Final    BOTTLES DRAWN AEROBIC ONLY Blood Culture adequate volume Performed at [facility] [address], [place], [place] [address]    Culture   Final    NO GROWTH 2 DAYS Performed at [facility] [address] [place], [place] [address]    Report Status PENDING  Incomplete  Aerobic/Anaerobic Culture (surgical/deep wound)     Status: None (Preliminary result)   Collection Time: 04/19/19 11:39 AM   Specimen: Biopsy; Tissue  Result Value Ref Range Status   Specimen Description   Final    BIOPSY RT NECK Performed at [facility] [address], [place], [place] [address]    Special Requests   Final    Immunocompromised Performed at [facility], [place] [address], [place], [place] [address]    Gram Stain NO WBC SEEN NO ORGANISMS SEEN   Final   Culture   Final    NO GROWTH 2 DAYS Performed at [facility], [place] [address] [place], [place] [address]    Report Status PENDING  Incomplete  Acid Fast Smear (AFB)     Status: None   Collection Time:  04/19/19 11:39 AM   Specimen: Neck, Right  Result Value Ref Range Status   AFB Specimen Processing Concentration  Final   Acid Fast Smear Negative  Final    Comment: (NOTE) Performed At: [facility], [place], [place] [id number] Rush Farmer MD [id number]    Source (AFB) BIOPSY  Final    Comment: RT NECK Performed at [facility], [place] [address] [place], [place] [address]     Radiology Reports CT HEAD W & WO CONTRAST  Result Date: 04/20/2019 CLINICAL DATA:  Suspected sinusitis EXAM: CT HEAD WITHOUT AND WITH CONTRAST TECHNIQUE: Contiguous axial images were obtained from the base of the skull through the vertex without and with intravenous contrast CONTRAST:  1102mL OMNIPAQUE IOHEXOL 350 MG/ML SOLN COMPARISON:  None. FINDINGS: Brain: There is no acute intracranial hemorrhage, mass-effect, or edema. Gray-white differentiation is preserved. Patchy hypoattenuation in the supratentorial white matter is nonspecific but may reflect mild chronic microvascular ischemic changes. There is no extra-axial fluid collection. Ventricles and sulci are within normal limits in size and configuration. Vascular: There is atherosclerotic calcification at the skull base. Skull: Calvarium is unremarkable. Sinuses/Orbits: Minimal patchy retained secretions left ethmoid sinuses. Otherwise trace paranasal sinus mucosal thickening. There is evidence chronic left maxillary sinus inflammation. Orbits are unremarkable. Other: None. IMPRESSION: No acute intracranial abnormality. Mild chronic microvascular ischemic changes. No significant paranasal sinus opacification. Electronically Signed   By: Macy Mis M.D.   On: 04/20/2019 14:32   CT CHEST W CONTRAST  Result Date: 04/20/2019 CLINICAL DATA:  Acute hypoxic respiratory failure due to COVID pneumonia. Febrile illness. EXAM: CT CHEST, ABDOMEN, AND PELVIS WITH CONTRAST TECHNIQUE: Multidetector CT  imaging of the chest, abdomen  and pelvis was performed following the standard protocol during bolus administration of intravenous contrast. CONTRAST:  182mL OMNIPAQUE IOHEXOL 350 MG/ML SOLN COMPARISON:  Chest CT 04/09/2019 FINDINGS: CT CHEST FINDINGS Cardiovascular: The heart is mildly enlarged but stable. Stable tortuosity, ectasia and calcification of the thoracic aorta. Stable scattered coronary artery calcifications. Mediastinum/Nodes: Persistent scattered mediastinal and hilar lymph nodes. The esophagus is grossly normal. Lungs/Pleura: Exam limited by breathing motion artifact but there is persistent diffuse interstitial and airspace disease in both lungs. The infiltrates demonstrate progressive consolidation versus the more ground-glass appearance on the prior study. No definite pleural effusions or pleural lesions. No pneumothorax. Musculoskeletal: No significant findings. CT ABDOMEN PELVIS FINDINGS Examination is limited due to breathing motion artifact. Hepatobiliary: No focal hepatic lesions or intrahepatic biliary dilatation. The gallbladder is normal. No common bile duct dilatation. Pancreas: Small cystic area noted in the pancreatic head which is likely a benign postinflammatory cyst. There is a second exophytic cyst projecting off the body tail junction region. No findings for acute pancreatitis and no ductal dilatation. Spleen: Normal size. No focal lesions. Adrenals/Urinary Tract: The adrenal glands and kidneys are grossly normal. Small left renal cyst noted. No hydronephrosis. The bladder is unremarkable. Stomach/Bowel: The stomach, duodenum, small bowel and colon are grossly normal. No acute inflammatory changes, mass lesions or obstructive findings. Moderate stool throughout the colon and down into the rectum suggesting constipation Vascular/Lymphatic: Moderate atherosclerotic calcifications involving the aorta and branch vessels but no aneurysm. No mesenteric or retroperitoneal mass or adenopathy. Reproductive: Mild prostate  gland enlargement. The seminal vesicles appear normal. Other: No pelvic mass or free pelvic fluid collections. No inguinal mass or adenopathy. Musculoskeletal: No significant bony findings. IMPRESSION: 1. Persistent diffuse interstitial and airspace disease in both lungs. The infiltrates demonstrate progressive consolidation versus the more ground-glass appearance on the prior study. 2. Stable mediastinal and hilar lymph nodes. 3. No acute abdominal/pelvic findings, mass lesions or adenopathy. 4. Cystic lesions in the pancreas, likely benign postinflammatory cysts but will need follow-up once the patient is no longer acutely ill. MRI of the abdomen would be helpful for further evaluation if possible at some point. 5. Moderate stool throughout the colon and down into the rectum suggesting constipation. Electronically Signed   By: Marijo Sanes M.D.   On: 04/20/2019 14:30   CT ANGIO CHEST PE W OR WO CONTRAST  Result Date: 04/09/2019 CLINICAL DATA:  COVID pneumonia, hypoxia, shortness of breath and elevated D-dimer. EXAM: CT ANGIOGRAPHY CHEST WITH CONTRAST TECHNIQUE: Multidetector CT imaging of the chest was performed using the standard protocol during bolus administration of intravenous contrast. Multiplanar CT image reconstructions and MIPs were obtained to evaluate the vascular anatomy. CONTRAST:  72mL OMNIPAQUE IOHEXOL 350 MG/ML SOLN COMPARISON:  Chest x-ray on 04/05/2019 FINDINGS: Cardiovascular: The pulmonary arteries are well opacified. There is no evidence pulmonary embolism. Central pulmonary arteries are normal in caliber. The heart size is normal. The thoracic aorta is normal in caliber. Trace amount of pericardial fluid. Calcified plaque is noted of the coronary arteries primarily in the distribution of the LAD. Mediastinum/Nodes: Mildly prominent right hilar nodal tissue measuring up to 1.5 cm. No evidence of enlarged mediastinal or axillary lymph nodes. Lungs/Pleura: Severe airspace disease is seen  throughout all lobes of both lungs in a ground-glass pattern. There is an associated very small right pleural effusion. No pneumothorax. Upper Abdomen: No acute abnormality. Musculoskeletal: No chest wall abnormality. No acute or significant osseous findings. Review of the MIP  images confirms the above findings. IMPRESSION: 1. No evidence of pulmonary embolism. 2. Severe airspace disease throughout all lobes of both lungs in a ground-glass pattern. Findings are consistent with severe bilateral COVID-19 pneumonia. 3. Mildly prominent right hilar nodal tissue measuring up to 1.5 cm in short axis. This is likely reactive. 4. Coronary artery disease with calcified plaque in the distribution of the LAD. Electronically Signed   By: Aletta Edouard M.D.   On: 04/09/2019 08:58   CT ABDOMEN PELVIS W CONTRAST  Result Date: 04/20/2019 CLINICAL DATA:  Acute hypoxic respiratory failure due to COVID pneumonia. Febrile illness. EXAM: CT CHEST, ABDOMEN, AND PELVIS WITH CONTRAST TECHNIQUE: Multidetector CT imaging of the chest, abdomen and pelvis was performed following the standard protocol during bolus administration of intravenous contrast. CONTRAST:  17mL OMNIPAQUE IOHEXOL 350 MG/ML SOLN COMPARISON:  Chest CT 04/09/2019 FINDINGS: CT CHEST FINDINGS Cardiovascular: The heart is mildly enlarged but stable. Stable tortuosity, ectasia and calcification of the thoracic aorta. Stable scattered coronary artery calcifications. Mediastinum/Nodes: Persistent scattered mediastinal and hilar lymph nodes. The esophagus is grossly normal. Lungs/Pleura: Exam limited by breathing motion artifact but there is persistent diffuse interstitial and airspace disease in both lungs. The infiltrates demonstrate progressive consolidation versus the more ground-glass appearance on the prior study. No definite pleural effusions or pleural lesions. No pneumothorax. Musculoskeletal: No significant findings. CT ABDOMEN PELVIS FINDINGS Examination is  limited due to breathing motion artifact. Hepatobiliary: No focal hepatic lesions or intrahepatic biliary dilatation. The gallbladder is normal. No common bile duct dilatation. Pancreas: Small cystic area noted in the pancreatic head which is likely a benign postinflammatory cyst. There is a second exophytic cyst projecting off the body tail junction region. No findings for acute pancreatitis and no ductal dilatation. Spleen: Normal size. No focal lesions. Adrenals/Urinary Tract: The adrenal glands and kidneys are grossly normal. Small left renal cyst noted. No hydronephrosis. The bladder is unremarkable. Stomach/Bowel: The stomach, duodenum, small bowel and colon are grossly normal. No acute inflammatory changes, mass lesions or obstructive findings. Moderate stool throughout the colon and down into the rectum suggesting constipation Vascular/Lymphatic: Moderate atherosclerotic calcifications involving the aorta and branch vessels but no aneurysm. No mesenteric or retroperitoneal mass or adenopathy. Reproductive: Mild prostate gland enlargement. The seminal vesicles appear normal. Other: No pelvic mass or free pelvic fluid collections. No inguinal mass or adenopathy. Musculoskeletal: No significant bony findings. IMPRESSION: 1. Persistent diffuse interstitial and airspace disease in both lungs. The infiltrates demonstrate progressive consolidation versus the more ground-glass appearance on the prior study. 2. Stable mediastinal and hilar lymph nodes. 3. No acute abdominal/pelvic findings, mass lesions or adenopathy. 4. Cystic lesions in the pancreas, likely benign postinflammatory cysts but will need follow-up once the patient is no longer acutely ill. MRI of the abdomen would be helpful for further evaluation if possible at some point. 5. Moderate stool throughout the colon and down into the rectum suggesting constipation. Electronically Signed   By: Marijo Sanes M.D.   On: 04/20/2019 14:30   DG Chest Port 1  View  Result Date: 04/16/2019 CLINICAL DATA:  74 year old male with a history of COVID pneumonia EXAM: PORTABLE CHEST 1 VIEW COMPARISON:  CT 04/09/2019, plain film 04/05/2019 FINDINGS: Cardiomediastinal silhouette unchanged in size and contour. Similar appearance of low lung volumes and mixed airspace and interstitial opacities of the bilateral lungs. No pneumothorax or pleural effusion. IMPRESSION: Unchanged appearance of the lungs with mixed interstitial and airspace disease bilateral compatible with multifocal pneumonia Electronically Signed   By:  Corrie Mckusick D.O.   On: 04/16/2019 07:48   DG Chest Port 1 View  Result Date: 04/05/2019 CLINICAL DATA:  COVID positive low oxygen level EXAM: PORTABLE CHEST 1 VIEW COMPARISON:  None. FINDINGS: Bilateral ground-glass opacities and consolidations. No pleural effusion. Normal heart size. Aortic atherosclerosis. No pneumothorax. IMPRESSION: Bilateral ground-glass opacities and consolidations consistent with bilateral pneumonia. Electronically Signed   By: Donavan Foil M.D.   On: 04/05/2019 16:47   VAS [place] LOWER EXTREMITY VENOUS (DVT)  Result Date: 04/09/2019  Lower Venous Study Indications: Covid positive with worsening positive d-Dimer and hypoxia.  Anticoagulation: Lovenox. Comparison Study: No priors. Performing Technologist: Oda Cogan RDMS, RVT  Examination Guidelines: A complete evaluation includes B-mode imaging, spectral Doppler, color Doppler, and power Doppler as needed of all accessible portions of each vessel. Bilateral testing is considered an integral part of a complete examination. Limited examinations for reoccurring indications may be performed as noted.  +---------+---------------+---------+-----------+----------+--------------+ RIGHT    CompressibilityPhasicitySpontaneityPropertiesThrombus Aging +---------+---------------+---------+-----------+----------+--------------+ CFV      Full           Yes      Yes                                  +---------+---------------+---------+-----------+----------+--------------+ SFJ      Full                                                        +---------+---------------+---------+-----------+----------+--------------+ FV Prox  Full                                                        +---------+---------------+---------+-----------+----------+--------------+ FV Mid   Full                                                        +---------+---------------+---------+-----------+----------+--------------+ FV DistalFull                                                        +---------+---------------+---------+-----------+----------+--------------+ PFV      Full                                                        +---------+---------------+---------+-----------+----------+--------------+ POP      Full           Yes      Yes                                 +---------+---------------+---------+-----------+----------+--------------+ PTV      Full                                                        +---------+---------------+---------+-----------+----------+--------------+  PERO     Full                                                        +---------+---------------+---------+-----------+----------+--------------+   +---------+---------------+---------+-----------+----------+--------------+ LEFT     CompressibilityPhasicitySpontaneityPropertiesThrombus Aging +---------+---------------+---------+-----------+----------+--------------+ CFV      Full           Yes      Yes                                 +---------+---------------+---------+-----------+----------+--------------+ SFJ      Full                                                        +---------+---------------+---------+-----------+----------+--------------+ FV Prox  Full                                                         +---------+---------------+---------+-----------+----------+--------------+ FV Mid   Full                                                        +---------+---------------+---------+-----------+----------+--------------+ FV DistalFull                                                        +---------+---------------+---------+-----------+----------+--------------+ PFV      Full                                                        +---------+---------------+---------+-----------+----------+--------------+ POP      Full           Yes      Yes                                 +---------+---------------+---------+-----------+----------+--------------+ PTV      Full                                                        +---------+---------------+---------+-----------+----------+--------------+ PERO     Partial                                      Acute          +---------+---------------+---------+-----------+----------+--------------+  Soleal   Full                                         Acute          +---------+---------------+---------+-----------+----------+--------------+     Summary: Right: There is no evidence of deep vein thrombosis in the lower extremity. No cystic structure found in the popliteal fossa. Left: Findings consistent with acute deep vein thrombosis involving the left peroneal veins, and left soleal veins. Short segment of localized thrombus in the peroneal and soleal veins in the mid segment.  *See table(s) above for measurements and observations. Electronically signed by Ruta Hinds MD on 04/09/2019 at 2:56:25 PM.    Final

## 2019-04-21 NOTE — Progress Notes (Signed)
Pharmacy Antibiotic Note  Dustin Diaz is a 74 y.o. 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 Noted risk factors of chronic prednisone/Actemra doses 1/15 and 1/16.    Pharmacy has been consulted for amphotericin B dosing.   Scr today is stable at 0.67- Will maintain fluid boluses at 500 mL pre and post amphotericin B dose. Magnesium is stable at 2.1. Potassium is down to 3.9 - will given KCl 20 mEq, and hold on Magnesium replacement.  Potassium and Magnesium will be monitored daily and replaced by pharmacy per protocol.  Plan: Liposomal amphotericin B 3 mg/kg (230 mg) every 24 hours  Normal saline 500 mL 1 hour prior to dose and immediately after Dextrose flushes between saline boluses and amphotericin B  Daily K and Mg  Pharmacy will replace per protocol     Height: 5\' 10"  (177.8 cm) Weight: 171 lb 15.3 oz (78 kg) IBW/kg (Calculated) : 73  Temp (24hrs), Avg:97.9 F (36.6 C), Min:97.5 F (36.4 C), Max:98.4 F (36.9 C)  Recent Labs  Lab 04/16/19 0755 04/16/19 0755 04/17/19 0155 04/18/19 0238 04/19/19 0135 04/20/19 0200 04/21/19 0202  WBC 14.2*  --  17.1* 14.4* 13.6* 10.9*  --   CREATININE 0.94   < > 0.78 0.83 0.70 0.67 0.67   < > = values in this interval not displayed.    Estimated Creatinine Clearance: 86.2 mL/min (by C-G formula based on SCr of 0.67 mg/dL).    Allergies  Allergen Reactions  . Codeine       Thank you for allowing pharmacy to be a part of this patient's care.  Gretta Arab PharmD, BCPS Clinical pharmacist phone 7am- 5pm: [phone] 04/21/2019 8:39 AM

## 2019-04-21 NOTE — Progress Notes (Signed)
Physical Therapy Treatment Patient Details Name: Dustin Diaz MRN: [id number] DOB: [birth year] Today's Date: 04/21/2019    History of Present Illness 74 year old male admitted with Covid /Pneumonia. PMH includes DM, HTN, hyperlipidemia, polymyalgia rheumatica.    PT Comments     The patient received on 2 L [place]. Ambulated to BR, noted 4/4 dyspnea after toileting. Placed on 4 L . SPO2 83 % when returned from BR. Placed on 6 L with return to 90% within 2 minutes. Significant dyspnea. Encouraged PLB. Marland Kitchen Patient appears weary and anxious about feeling unable to breathe. Encouraged and supported  Patient   that he has started to ambulate and requires less Oxygen. Continue progressive activity.  Follow Up Recommendations  Home health PT;Supervision - Intermittent     Equipment Recommendations    none   Recommendations for Other Services       Precautions / Restrictions Precautions Precautions: Fall Precaution Comments: monitor O2 Sats    Mobility  Bed Mobility Overal bed mobility: Modified Independent                Transfers   Equipment used: Rolling walker (2 wheeled) Transfers: Sit to/from Stand Sit to Stand: Min guard            Ambulation/Gait Ambulation/Gait assistance: Min guard Gait Distance (Feet): 22 Feet(x2) Assistive device: Rolling walker (2 wheeled) Gait Pattern/deviations: Step-through pattern Gait velocity: decr   General Gait Details: ambulated from bed to rest room able to use rest room and ambulate back to bed using RW, O2 from 2 to 6 L for resp. ditress. SpO2 845   Stairs             Wheelchair Mobility    Modified Rankin (Stroke Patients Only)       Balance                                            Cognition Arousal/Alertness: Awake/alert Behavior During Therapy: Flat affect;Anxious                                   General Comments: patient expresses concern that he he is SOB for  minimal movement.      Exercises      General Comments        Pertinent Vitals/Pain Pain Assessment: Faces Faces Pain Scale: Hurts even more Pain Location: mouth. lips, neck sores Pain Descriptors / Indicators: Burning;Constant;Discomfort Pain Intervention(s): Monitored during session    Home Living                      Prior Function            PT Goals (current goals can now be found in the care plan section) Progress towards PT goals: Progressing toward goals    Frequency    Min 3X/week      PT Plan Current plan remains appropriate    Co-evaluation              AM-PAC PT "6 Clicks" Mobility   Outcome Measure  Help needed turning from your back to your side while in a flat bed without using bedrails?: None Help needed moving from lying on your back to sitting on the side of a flat bed without using bedrails?: None Help  needed moving to and from a bed to a chair (including a wheelchair)?: A Little Help needed standing up from a chair using your arms (e.g., wheelchair or bedside chair)?: A Little Help needed to walk in hospital room?: A Little Help needed climbing 3-5 steps with a railing? : A Lot 6 Click Score: 19    End of Session Equipment Utilized During Treatment: Oxygen Activity Tolerance: Patient limited by fatigue;Treatment limited secondary to medical complications (Comment) Patient left: in bed;with call bell/phone within reach Nurse Communication: Mobility status PT Visit Diagnosis: Unsteadiness on feet (R26.81);Other abnormalities of gait and mobility (R26.89);Muscle weakness (generalized) (M62.81)     Time: [id number] PT Time Calculation (min) (ACUTE ONLY): 25 min  Charges:  $Gait Training: 8-22 mins $Self Care/Home Management: [place]  Office [phone]    Claretha Cooper 04/21/2019, 4:25 PM

## 2019-04-22 LAB — MAGNESIUM: Magnesium: 2.3 mg/dL (ref 1.7–2.4)

## 2019-04-22 LAB — COMPREHENSIVE METABOLIC PANEL
ALT: 19 U/L (ref 0–44)
AST: 20 U/L (ref 15–41)
Albumin: 3.1 g/dL — ABNORMAL LOW (ref 3.5–5.0)
Alkaline Phosphatase: 68 U/L (ref 38–126)
Anion gap: 9 (ref 5–15)
BUN: 23 mg/dL (ref 8–23)
CO2: 27 mmol/L (ref 22–32)
Calcium: 8.4 mg/dL — ABNORMAL LOW (ref 8.9–10.3)
Chloride: 99 mmol/L (ref 98–111)
Creatinine, Ser: 0.86 mg/dL (ref 0.61–1.24)
GFR calc Af Amer: >60 mL/min (ref >60)
GFR calc non Af Amer: >60 mL/min (ref >60)
Glucose, Bld: 119 mg/dL — ABNORMAL HIGH (ref 70–99)
Potassium: 3.9 mmol/L (ref 3.5–5.1)
Sodium: 135 mmol/L (ref 135–145)
Total Bilirubin: 1.2 mg/dL (ref 0.3–1.2)
Total Protein: 5.8 g/dL — ABNORMAL LOW (ref 6.5–8.1)

## 2019-04-22 LAB — CBC WITH DIFFERENTIAL/PLATELET
Abs Immature Granulocytes: 0.12 10*3/uL — ABNORMAL HIGH (ref 0.00–0.07)
Basophils Absolute: 0.1 10*3/uL (ref 0.0–0.1)
Basophils Relative: 1 %
Eosinophils Absolute: 0.4 10*3/uL (ref 0.0–0.5)
Eosinophils Relative: 4 %
HCT: 41.6 % (ref 39.0–52.0)
Hemoglobin: 13.9 g/dL (ref 13.0–17.0)
Immature Granulocytes: 1 %
Lymphocytes Relative: 9 %
Lymphs Abs: 1 10*3/uL (ref 0.7–4.0)
MCH: 30.3 pg (ref 26.0–34.0)
MCHC: 33.4 g/dL (ref 30.0–36.0)
MCV: 90.6 fL (ref 80.0–100.0)
Monocytes Absolute: 1.5 10*3/uL — ABNORMAL HIGH (ref 0.1–1.0)
Monocytes Relative: 14 %
Neutro Abs: 7.7 10*3/uL (ref 1.7–7.7)
Neutrophils Relative %: 71 %
Platelets: 241 10*3/uL (ref 150–400)
RBC: 4.59 MIL/uL (ref 4.22–5.81)
RDW: 14.1 % (ref 11.5–15.5)
WBC: 10.7 10*3/uL — ABNORMAL HIGH (ref 4.0–10.5)
nRBC: 0 % (ref 0.0–0.2)

## 2019-04-22 LAB — BRAIN NATRIURETIC PEPTIDE: B Natriuretic Peptide: 50.1 pg/mL (ref 0.0–100.0)

## 2019-04-22 LAB — GLUCOSE, CAPILLARY
Glucose-Capillary: 128 mg/dL — ABNORMAL HIGH (ref 70–99)
Glucose-Capillary: 237 mg/dL — ABNORMAL HIGH (ref 70–99)
Glucose-Capillary: 206 mg/dL — ABNORMAL HIGH (ref 70–99)
Glucose-Capillary: 261 mg/dL — ABNORMAL HIGH (ref 70–99)

## 2019-04-22 LAB — C-REACTIVE PROTEIN: CRP: 0.9 mg/dL (ref <1.0)

## 2019-04-22 LAB — D-DIMER, QUANTITATIVE: D-Dimer, Quant: 3.19 ug/mL-FEU — ABNORMAL HIGH (ref 0.00–0.50)

## 2019-04-22 MED ORDER — SODIUM CHLORIDE 0.9 % IV BOLUS
350.0000 mL | INTRAVENOUS | Status: DC
  Administered 2019-04-22: 16:00:00 350 mL via INTRAVENOUS

## 2019-04-22 MED ORDER — TRAMADOL HCL 50 MG PO TABS
50.0000 mg | ORAL_TABLET | Freq: Four times a day (QID) | ORAL | Status: DC | PRN
  Administered 2019-04-22 – 2019-04-30 (×8): 50 mg via ORAL
  Filled 2019-04-22 (×8): qty 1

## 2019-04-22 MED ORDER — POTASSIUM CHLORIDE CRYS ER 20 MEQ PO TBCR
20.0000 meq | EXTENDED_RELEASE_TABLET | Freq: Once | ORAL | Status: AC
  Administered 2019-04-22: 20 meq via ORAL
  Filled 2019-04-22: qty 1

## 2019-04-22 MED ORDER — FUROSEMIDE 20 MG PO TABS
40.0000 mg | ORAL_TABLET | Freq: Once | ORAL | Status: AC
  Administered 2019-04-22: 40 mg via ORAL
  Filled 2019-04-22: qty 2

## 2019-04-22 MED ORDER — SODIUM CHLORIDE 0.9 % IV BOLUS
350.0000 mL | INTRAVENOUS | Status: DC
  Administered 2019-04-22 – 2019-04-23 (×2): 350 mL via INTRAVENOUS

## 2019-04-22 NOTE — Progress Notes (Signed)
PROGRESS NOTE                                                                                                                                                                                                             Patient Demographics:    Dustin Diaz, is a 74 y.o. male, DOB - 13-May-1945, [id number]  Outpatient Primary MD for the patient is Lavone Orn, MD   Admit date - 04/05/2019   LOS - 24  Chief Complaint  Patient presents with   Covid positive   low O2       Brief Narrative:  Patient is a 74 y.o. 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  Hospital course complicated by slow improvement in hypoxia,lower extremity DVT, and nodular ulcerated skin lesions raising concern for invasive fungal infection.  See below for further details.   Subjective:   Patient in chair, appears comfortable, denies any headache, no fever, no chest pain or pressure, improving shortness of breath , no abdominal pain. No focal weakness.  His new lesions which are appearing on the back of his neck are slightly painful.   Assessment  & Plan :   Acute Hypoxic Resp Failure due to Covid 19 Viral pneumonia: Slowly improved-down to just 2 L at rest this morning-but gets very dyspneic with minimal movement.  Has required around 10-15 L of oxygen with ambulation-in the past few sessions with physical therapy.  Has completed a course of remdesivir-and has been slowly weaned back to his usual home regimen of prednisone.  O2 requirements:  SpO2: 98 % O2 Flow Rate (L/min): 2 L/min FiO2 (%): 100 %   COVID-19 Labs: Recent Labs    04/20/19 0200 04/22/19 0155  DDIMER 7.14* 3.19*  FERRITIN 360*  --   CRP 0.7 0.9       Component Value Date/Time   BNP 50.1 04/22/2019 0155      Recent Labs  Lab 04/16/19 0755  PROCALCITON <0.10    No results found for: SARSCOV2NAA   COVID-19 Medications: Decadron: 1/14>>1/23 Remdesivir: 1/14>> 1/18 Actemra: 1/15 x 1, 1/16 x 1  Antibiotics: Vancomycin 1/26>> 1/27 Cefepime 1/26>> 1/27  Prone/Incentive Spirometry: encouraged  incentive spirometry use  3-4/hour.  Nodular cavitary skin lesions in the right neck area: A few days back-developed what looked like papular lesions on the right side of his neck that progressed to a nodular lesion that has since ulcerated in the center.  Given his chronic prednisone use- Actemra use-concern for invasive fungal infection.  Serum cryptococcal antigen negative.  Punch biopsy done on 1/28-awaiting biopsy/microbiology.  CT head/chest/abdomen ordered as recommended by ID.  Following-patient currently on IV amphotericin.  Antifungal agents: Amphotericin B 1/27>>  Fungal blood cultures on 1/27: Negative so far  Pending tests: Right neck tissue biopsy, aerobic/anaerobic cultures/fungal and AFB cultures Urine histoplasma antigen, blastomycosis antigen, histoplasma galactomannan   Left lower extremity DVT: Continue Eliquis-CTA chest on 1/18 without PE.  DM-2: CBG stable-continue Lantus 6 units and NovoLog.  Follow.  CBG (last 3)  Recent Labs    04/21/19 0714 04/21/19 1142 04/22/19 0734  GLUCAP 133* 163* 128*    HTN: BP controlled-have discontinued all antihypertensives on 1/26-due to dizziness upon standing.  Dyslipidemia: Continue statin  Bibasilar Rales on exam suggestive of fluid overload.  Trial of IV Lasix and monitor.  History of PMR: Continue prednisone-dose gradually taper down back to his usual home regimen of 3 mg.  Right hilar lymphadenopathy: Likely reactive-repeat imaging in 3 to 6 months as outpatient.  ? Pancreatic Cyst on CT - PCP guided follow up.  Nutrition Problem: Nutrition Problem: Increased nutrient needs Etiology: catabolic [id number]  PNA) Signs/Symptoms: estimated needs Interventions: Hormel Shake    Consults  :  CCS, ID  Procedures  :   1/28>> Punch biopsy right neck lesion by general surgery   Condition - Fair  Family Communication  :  Previous MD  -  Wife updated 04/22/2019  Code Status :  Full Code  Diet :  Diet Order            Diet heart healthy/carb modified Room service appropriate? Yes; Fluid consistency: Thin  Diet effective now               Disposition Plan  :  Remain hospitalized-Home when hypoxia improves.  Barriers to discharge: Hypoxia requiring O2 supplementation/remains on IV amphotericin B (see assessment/plan section for further details)  Antimicorbials  :    Anti-infectives (From admission, onward)   Start     Dose/Rate Route Frequency Ordered Stop   04/18/19 1800  amphotericin B liposome (AMBISOME) 230 mg in dextrose 5 % 500 mL IVPB     3 mg/kg  78 kg 278.8 mL/hr over 120 Minutes Intravenous Every 24 hours 04/18/19 1447     04/18/19 1000  vancomycin (VANCOCIN) IVPB 1000 mg/200 mL premix  Status:  Discontinued     1,000 mg 200 mL/hr over 60 Minutes Intravenous Every 12 hours 04/17/19 1400 04/18/19 1138   04/17/19 1500  ceFEPIme (MAXIPIME) 2 g in sodium chloride 0.9 % 100 mL IVPB  Status:  Discontinued     2 g 200 mL/hr over 30 Minutes Intravenous Every 8 hours 04/17/19 1359 04/18/19 1335   04/17/19 1430  vancomycin (VANCOREADY) IVPB 1500 mg/300 mL     1,500 mg 150 mL/hr over 120 Minutes Intravenous  Once 04/17/19 1400 04/17/19 1825   04/06/19 1000  remdesivir 100 mg in sodium chloride 0.9 % 100 mL IVPB  Status:  Discontinued     100 mg 200 mL/hr over 30 Minutes Intravenous Daily 04/05/19 1919 04/05/19 1919   04/06/19 1000  remdesivir 100 mg in sodium chloride 0.9 % 100 mL IVPB  100 mg 200 mL/hr over 30 Minutes Intravenous Daily 04/05/19 1919 04/09/19 0948   04/06/19 0400  azithromycin (ZITHROMAX) 500 mg in sodium chloride 0.9 % 250 mL IVPB  Status:  Discontinued     500  mg 250 mL/hr over 60 Minutes Intravenous Every 24 hours 04/06/19 0242 04/06/19 0725   04/06/19 0300  cefTRIAXone (ROCEPHIN) 1 g in sodium chloride 0.9 % 100 mL IVPB  Status:  Discontinued     1 g 200 mL/hr over 30 Minutes Intravenous Every 24 hours 04/06/19 0242 04/06/19 0725   04/05/19 2100  remdesivir 200 mg in sodium chloride 0.9% 250 mL IVPB     200 mg 580 mL/hr over 30 Minutes Intravenous Once 04/05/19 1919 04/05/19 2200   04/05/19 1918  remdesivir 200 mg in sodium chloride 0.9% 250 mL IVPB  Status:  Discontinued     200 mg 580 mL/hr over 30 Minutes Intravenous Once 04/05/19 1919 04/05/19 1919     DVT Prophylaxis  : Eliquis  Inpatient Medications  Scheduled Meds:  apixaban  5 mg Oral BID   vitamin C  500 mg Oral Daily   atorvastatin  10 mg Oral Daily   dextrose  10 mL Intravenous Q24H   dextrose  10 mL Intravenous Q24H   docusate sodium  200 mg Oral BID   fluticasone  2 spray Each Nare Daily   insulin aspart  0-15 Units Subcutaneous TID WC   insulin aspart  0-5 Units Subcutaneous QHS   insulin aspart  4 Units Subcutaneous TID WC   insulin glargine  6 Units Subcutaneous QHS   Ipratropium-Albuterol  1 puff Inhalation Q6H   latanoprost  1 drop Both Eyes QHS   pantoprazole  40 mg Oral Daily   predniSONE  3 mg Oral Daily   zinc sulfate  220 mg Oral Daily   Continuous Infusions:  amphotericin  B  Liposome (AMBISOME) ADULT IV 230 mg (04/21/19 1702)   sodium chloride 250 mL (04/21/19 1620)   sodium chloride Stopped (04/21/19 2110)   PRN Meds:.acetaminophen, ALPRAZolam, chlorpheniramine-HYDROcodone, diphenhydrAMINE **OR** diphenhydrAMINE, guaiFENesin-dextromethorphan, lip balm, magic mouthwash w/lidocaine, meperidine (DEMEROL) injection, [DISCONTINUED] ondansetron **OR** ondansetron (ZOFRAN) IV, sodium chloride, sodium chloride flush, traMADol   Time Spent in minutes  25  See all Orders from today for further details   Lala Lund M.D on 04/22/2019 at  9:41 AM  To page go to [email or web address] - use universal password  [facility] -  Office  [phone]    Objective:   Vitals:   04/21/19 1623 04/21/19 2001 04/22/19 0453 04/22/19 0716  BP: 124/64 139/72 (!) 150/81 139/76  Pulse: 78 78 69 73  Resp: 18 18 19 18   Temp: 98.5 F (36.9 C) 97.8 F (36.6 C) 98.1 F (36.7 C) 98.1 F (36.7 C)  TempSrc: Oral Oral Oral Oral  SpO2: 95% 96% 93% 98%  Weight:      Height:        Wt Readings from Last 3 Encounters:  04/06/19 78 kg  07/24/12 93 kg     Intake/Output Summary (Last 24 hours) at 04/22/2019 0941 Last data filed at 04/22/2019 0925 Gross per 24 hour  Intake 1580 ml  Output 1020 ml  Net 560 ml    Physical Exam  Awake Alert,  No new F.N deficits, Normal affect [place].AT,PERRAL Supple Neck,No JVD, No cervical lymphadenopathy appriciated.  Symmetrical Chest wall movement, Good air movement bilaterally, +ve rales RRR,No Gallops, Rubs or new Murmurs, No Parasternal Heave +ve B.Sounds, Abd Soft, No  tenderness, No organomegaly appriciated, No rebound - guarding or rigidity.  Patient has multiple nodular lesions one on his lower lip, 2 on the right lateral aspect of his neck, 1 on his occipital area and 2 putting lesions on the back of his neck, pictures below  Skin: See pictures below (taken on 1/27)                      Data Review:    CBC Recent Labs  Lab 04/17/19 0155 04/18/19 0238 04/19/19 0135 04/20/19 0200 04/22/19 0155  WBC 17.1* 14.4* 13.6* 10.9* 10.7*  HGB 14.7 14.1 14.0 15.0 13.9  HCT 45.1 42.1 41.6 47.4 41.6  PLT 228 233 227 293 241  MCV 91.7 90.7 90.8 97.1 90.6  MCH 29.9 30.4 30.6 30.7 30.3  MCHC 32.6 33.5 33.7 31.6 33.4  RDW 14.2 14.1 14.1 14.7 14.1  LYMPHSABS  --   --   --   --  1.0  MONOABS  --   --   --   --  1.5*  EOSABS  --   --   --   --  0.4  BASOSABS  --   --   --   --  0.1    Chemistries  Recent Labs  Lab 04/17/19 0155 04/17/19 0155 04/18/19 0238 04/19/19 0135  04/20/19 0200 04/21/19 0202 04/22/19 0155  NA 135   < > 136 133* 134* 135 135  K 4.9   < > 4.6 4.0 4.4 3.9 3.9  CL 99   < > 102 102 103 101 99  CO2 26   < > 26 23 23 24 27   GLUCOSE 117*   < > 110* 116* 112* 114* 119*  BUN 32*   < > 30* 24* 23 23 23   CREATININE 0.78   < > 0.83 0.70 0.67 0.67 0.86  CALCIUM 8.5*   < > 8.4* 8.3* 8.3* 8.6* 8.4*  MG  --   --   --  2.1 2.1 2.1 2.3  AST 23  --  18 19 24   --  20  ALT 18  --  15 18 18   --  19  ALKPHOS 96  --  78 71 72  --  68  BILITOT 0.8  --  0.9 1.7* 1.2  --  1.2   < > = values in this interval not displayed.   ------------------------------------------------------------------------------------------------------------------ No results for input(s): CHOL, HDL, LDLCALC, TRIG, CHOLHDL, LDLDIRECT in the last 72 hours.  Lab Results  Component Value Date   HGBA1C 7.1 (H) 04/06/2019   ------------------------------------------------------------------------------------------------------------------ No results for input(s): TSH, T4TOTAL, T3FREE, THYROIDAB in the last 72 hours.  Invalid input(s): FREET3 ------------------------------------------------------------------------------------------------------------------ Recent Labs    04/20/19 0200  FERRITIN 360*    Coagulation profile No results for input(s): INR, PROTIME in the last 168 hours.  Recent Labs    04/20/19 0200 04/22/19 0155  DDIMER 7.14* 3.19*    Cardiac Enzymes No results for input(s): CKMB, TROPONINI, MYOGLOBIN in the last 168 hours.  Invalid input(s): CK ------------------------------------------------------------------------------------------------------------------    Component Value Date/Time   BNP 50.1 04/22/2019 0155    Micro Results Recent Results (from the past 240 hour(s))  MRSA PCR Screening     Status: None   Collection Time: 04/17/19  4:17 PM   Specimen: Nasal Mucosa; Nasopharyngeal  Result Value Ref Range Status   MRSA by PCR NEGATIVE NEGATIVE  Final    Comment:        The GeneXpert  MRSA Assay (FDA approved for NASAL specimens only), is one component of a comprehensive MRSA colonization surveillance program. It is not intended to diagnose MRSA infection nor to guide or monitor treatment for MRSA infections. Performed at [facility], [place] [address] [place], [place] [address]   Fungus culture, blood     Status: None (Preliminary result)   Collection Time: 04/18/19  3:00 PM   Specimen: BLOOD  Result Value Ref Range Status   Specimen Description   Final    BLOOD RIGHT ANTECUBITAL Performed at [facility] [address], [place], [place] [address]    Special Requests   Final    BOTTLES DRAWN AEROBIC ONLY Blood Culture adequate volume Performed at [facility] [address], [place], [place] [address]    Culture   Final    NO GROWTH 3 DAYS Performed at [facility], [place] [address] [place], [place] [address]    Report Status PENDING  Incomplete  Fungus culture, blood     Status: None (Preliminary result)   Collection Time: 04/18/19  3:05 PM   Specimen: BLOOD RIGHT HAND  Result Value Ref Range Status   Specimen Description   Final    BLOOD RIGHT HAND Performed at [facility] [address], [place], [place] [address]    Special Requests   Final    BOTTLES DRAWN AEROBIC ONLY Blood Culture adequate volume Performed at [facility] [address], [place], [place] [address]    Culture   Final    NO GROWTH 3 DAYS Performed at [facility], [place] [address], [place], [place] [address]    Report Status PENDING  Incomplete  Aerobic/Anaerobic Culture (surgical/deep wound)     Status: None (Preliminary result)   Collection Time: 04/19/19 11:39 AM   Specimen: Biopsy; Tissue  Result Value Ref Range Status   Specimen Description   Final    BIOPSY RT NECK Performed at [facility]  [address] [place], [place] [address]    Special Requests   Final    Immunocompromised Performed at [facility], [place] [address], [place], [place] [address]    Gram Stain NO WBC SEEN NO ORGANISMS SEEN   Final   Culture   Final    NO GROWTH 2 DAYS Performed at [facility], [place] [address], [place], [place] [address]    Report Status PENDING  Incomplete  Acid Fast Smear (AFB)     Status: None   Collection Time: 04/19/19 11:39 AM   Specimen: Neck, Right  Result Value Ref Range Status   AFB Specimen Processing Concentration  Final   Acid Fast Smear Negative  Final    Comment: (NOTE) Performed At: [facility] [place], [place] [id number] Rush Farmer MD [id number]    Source (AFB) BIOPSY  Final    Comment: RT NECK Performed at [facility], [place] [address] [place],  [address]     Radiology Reports CT HEAD W & WO CONTRAST  Result Date: 04/20/2019 CLINICAL DATA:  Suspected sinusitis EXAM: CT HEAD WITHOUT AND WITH CONTRAST TECHNIQUE: Contiguous axial images were obtained from the base of the skull through the vertex without and with intravenous contrast CONTRAST:  125mL OMNIPAQUE IOHEXOL 350 MG/ML SOLN COMPARISON:  None. FINDINGS: Brain: There is no acute intracranial hemorrhage, mass-effect, or edema. Gray-white differentiation is preserved. Patchy hypoattenuation in the supratentorial white  matter is nonspecific but may reflect mild chronic microvascular ischemic changes. There is no extra-axial fluid collection. Ventricles and sulci are within normal limits in size and configuration. Vascular: There is atherosclerotic calcification at the skull base. Skull: Calvarium is unremarkable. Sinuses/Orbits: Minimal patchy retained secretions left ethmoid sinuses. Otherwise trace paranasal sinus mucosal thickening. There is evidence chronic left maxillary sinus inflammation. Orbits are unremarkable. Other: None.  IMPRESSION: No acute intracranial abnormality. Mild chronic microvascular ischemic changes. No significant paranasal sinus opacification. Electronically Signed   By: Macy Mis M.D.   On: 04/20/2019 14:32   CT CHEST W CONTRAST  Result Date: 04/20/2019 CLINICAL DATA:  Acute hypoxic respiratory failure due to COVID pneumonia. Febrile illness. EXAM: CT CHEST, ABDOMEN, AND PELVIS WITH CONTRAST TECHNIQUE: Multidetector CT imaging of the chest, abdomen and pelvis was performed following the standard protocol during bolus administration of intravenous contrast. CONTRAST:  170mL OMNIPAQUE IOHEXOL 350 MG/ML SOLN COMPARISON:  Chest CT 04/09/2019 FINDINGS: CT CHEST FINDINGS Cardiovascular: The heart is mildly enlarged but stable. Stable tortuosity, ectasia and calcification of the thoracic aorta. Stable scattered coronary artery calcifications. Mediastinum/Nodes: Persistent scattered mediastinal and hilar lymph nodes. The esophagus is grossly normal. Lungs/Pleura: Exam limited by breathing motion artifact but there is persistent diffuse interstitial and airspace disease in both lungs. The infiltrates demonstrate progressive consolidation versus the more ground-glass appearance on the prior study. No definite pleural effusions or pleural lesions. No pneumothorax. Musculoskeletal: No significant findings. CT ABDOMEN PELVIS FINDINGS Examination is limited due to breathing motion artifact. Hepatobiliary: No focal hepatic lesions or intrahepatic biliary dilatation. The gallbladder is normal. No common bile duct dilatation. Pancreas: Small cystic area noted in the pancreatic head which is likely a benign postinflammatory cyst. There is a second exophytic cyst projecting off the body tail junction region. No findings for acute pancreatitis and no ductal dilatation. Spleen: Normal size. No focal lesions. Adrenals/Urinary Tract: The adrenal glands and kidneys are grossly normal. Small left renal cyst noted. No hydronephrosis.  The bladder is unremarkable. Stomach/Bowel: The stomach, duodenum, small bowel and colon are grossly normal. No acute inflammatory changes, mass lesions or obstructive findings. Moderate stool throughout the colon and down into the rectum suggesting constipation Vascular/Lymphatic: Moderate atherosclerotic calcifications involving the aorta and branch vessels but no aneurysm. No mesenteric or retroperitoneal mass or adenopathy. Reproductive: Mild prostate gland enlargement. The seminal vesicles appear normal. Other: No pelvic mass or free pelvic fluid collections. No inguinal mass or adenopathy. Musculoskeletal: No significant bony findings. IMPRESSION: 1. Persistent diffuse interstitial and airspace disease in both lungs. The infiltrates demonstrate progressive consolidation versus the more ground-glass appearance on the prior study. 2. Stable mediastinal and hilar lymph nodes. 3. No acute abdominal/pelvic findings, mass lesions or adenopathy. 4. Cystic lesions in the pancreas, likely benign postinflammatory cysts but will need follow-up once the patient is no longer acutely ill. MRI of the abdomen would be helpful for further evaluation if possible at some point. 5. Moderate stool throughout the colon and down into the rectum suggesting constipation. Electronically Signed   By: Marijo Sanes M.D.   On: 04/20/2019 14:30   CT ANGIO CHEST PE W OR WO CONTRAST  Result Date: 04/09/2019 CLINICAL DATA:  COVID pneumonia, hypoxia, shortness of breath and elevated D-dimer. EXAM: CT ANGIOGRAPHY CHEST WITH CONTRAST TECHNIQUE: Multidetector CT imaging of the chest was performed using the standard protocol during bolus administration of intravenous contrast. Multiplanar CT image reconstructions and MIPs were obtained to evaluate the vascular anatomy. CONTRAST:  71mL OMNIPAQUE IOHEXOL  350 MG/ML SOLN COMPARISON:  Chest x-ray on 04/05/2019 FINDINGS: Cardiovascular: The pulmonary arteries are well opacified. There is no evidence  pulmonary embolism. Central pulmonary arteries are normal in caliber. The heart size is normal. The thoracic aorta is normal in caliber. Trace amount of pericardial fluid. Calcified plaque is noted of the coronary arteries primarily in the distribution of the LAD. Mediastinum/Nodes: Mildly prominent right hilar nodal tissue measuring up to 1.5 cm. No evidence of enlarged mediastinal or axillary lymph nodes. Lungs/Pleura: Severe airspace disease is seen throughout all lobes of both lungs in a ground-glass pattern. There is an associated very small right pleural effusion. No pneumothorax. Upper Abdomen: No acute abnormality. Musculoskeletal: No chest wall abnormality. No acute or significant osseous findings. Review of the MIP images confirms the above findings. IMPRESSION: 1. No evidence of pulmonary embolism. 2. Severe airspace disease throughout all lobes of both lungs in a ground-glass pattern. Findings are consistent with severe bilateral COVID-19 pneumonia. 3. Mildly prominent right hilar nodal tissue measuring up to 1.5 cm in short axis. This is likely reactive. 4. Coronary artery disease with calcified plaque in the distribution of the LAD. Electronically Signed   By: Aletta Edouard M.D.   On: 04/09/2019 08:58   CT ABDOMEN PELVIS W CONTRAST  Result Date: 04/20/2019 CLINICAL DATA:  Acute hypoxic respiratory failure due to COVID pneumonia. Febrile illness. EXAM: CT CHEST, ABDOMEN, AND PELVIS WITH CONTRAST TECHNIQUE: Multidetector CT imaging of the chest, abdomen and pelvis was performed following the standard protocol during bolus administration of intravenous contrast. CONTRAST:  18mL OMNIPAQUE IOHEXOL 350 MG/ML SOLN COMPARISON:  Chest CT 04/09/2019 FINDINGS: CT CHEST FINDINGS Cardiovascular: The heart is mildly enlarged but stable. Stable tortuosity, ectasia and calcification of the thoracic aorta. Stable scattered coronary artery calcifications. Mediastinum/Nodes: Persistent scattered mediastinal and  hilar lymph nodes. The esophagus is grossly normal. Lungs/Pleura: Exam limited by breathing motion artifact but there is persistent diffuse interstitial and airspace disease in both lungs. The infiltrates demonstrate progressive consolidation versus the more ground-glass appearance on the prior study. No definite pleural effusions or pleural lesions. No pneumothorax. Musculoskeletal: No significant findings. CT ABDOMEN PELVIS FINDINGS Examination is limited due to breathing motion artifact. Hepatobiliary: No focal hepatic lesions or intrahepatic biliary dilatation. The gallbladder is normal. No common bile duct dilatation. Pancreas: Small cystic area noted in the pancreatic head which is likely a benign postinflammatory cyst. There is a second exophytic cyst projecting off the body tail junction region. No findings for acute pancreatitis and no ductal dilatation. Spleen: Normal size. No focal lesions. Adrenals/Urinary Tract: The adrenal glands and kidneys are grossly normal. Small left renal cyst noted. No hydronephrosis. The bladder is unremarkable. Stomach/Bowel: The stomach, duodenum, small bowel and colon are grossly normal. No acute inflammatory changes, mass lesions or obstructive findings. Moderate stool throughout the colon and down into the rectum suggesting constipation Vascular/Lymphatic: Moderate atherosclerotic calcifications involving the aorta and branch vessels but no aneurysm. No mesenteric or retroperitoneal mass or adenopathy. Reproductive: Mild prostate gland enlargement. The seminal vesicles appear normal. Other: No pelvic mass or free pelvic fluid collections. No inguinal mass or adenopathy. Musculoskeletal: No significant bony findings. IMPRESSION: 1. Persistent diffuse interstitial and airspace disease in both lungs. The infiltrates demonstrate progressive consolidation versus the more ground-glass appearance on the prior study. 2. Stable mediastinal and hilar lymph nodes. 3. No acute  abdominal/pelvic findings, mass lesions or adenopathy. 4. Cystic lesions in the pancreas, likely benign postinflammatory cysts but will need follow-up once the patient is  no longer acutely ill. MRI of the abdomen would be helpful for further evaluation if possible at some point. 5. Moderate stool throughout the colon and down into the rectum suggesting constipation. Electronically Signed   By: Marijo Sanes M.D.   On: 04/20/2019 14:30   DG Chest Port 1 View  Result Date: 04/16/2019 CLINICAL DATA:  74 year old male with a history of COVID pneumonia EXAM: PORTABLE CHEST 1 VIEW COMPARISON:  CT 04/09/2019, plain film 04/05/2019 FINDINGS: Cardiomediastinal silhouette unchanged in size and contour. Similar appearance of low lung volumes and mixed airspace and interstitial opacities of the bilateral lungs. No pneumothorax or pleural effusion. IMPRESSION: Unchanged appearance of the lungs with mixed interstitial and airspace disease bilateral compatible with multifocal pneumonia Electronically Signed   By: Corrie Mckusick D.O.   On: 04/16/2019 07:48   DG Chest Port 1 View  Result Date: 04/05/2019 CLINICAL DATA:  COVID positive low oxygen level EXAM: PORTABLE CHEST 1 VIEW COMPARISON:  None. FINDINGS: Bilateral ground-glass opacities and consolidations. No pleural effusion. Normal heart size. Aortic atherosclerosis. No pneumothorax. IMPRESSION: Bilateral ground-glass opacities and consolidations consistent with bilateral pneumonia. Electronically Signed   By: Donavan Foil M.D.   On: 04/05/2019 16:47   VAS [place] LOWER EXTREMITY VENOUS (DVT)  Result Date: 04/09/2019  Lower Venous Study Indications: Covid positive with worsening positive d-Dimer and hypoxia.  Anticoagulation: Lovenox. Comparison Study: No priors. Performing Technologist: Oda Cogan RDMS, RVT  Examination Guidelines: A complete evaluation includes B-mode imaging, spectral Doppler, color Doppler, and power Doppler as needed of all accessible portions  of each vessel. Bilateral testing is considered an integral part of a complete examination. Limited examinations for reoccurring indications may be performed as noted.  +---------+---------------+---------+-----------+----------+--------------+  RIGHT     Compressibility Phasicity Spontaneity Properties Thrombus Aging  +---------+---------------+---------+-----------+----------+--------------+  CFV       Full            Yes       Yes                                    +---------+---------------+---------+-----------+----------+--------------+  SFJ       Full                                                             +---------+---------------+---------+-----------+----------+--------------+  FV Prox   Full                                                             +---------+---------------+---------+-----------+----------+--------------+  FV Mid    Full                                                             +---------+---------------+---------+-----------+----------+--------------+  FV Distal Full                                                             +---------+---------------+---------+-----------+----------+--------------+  PFV       Full                                                             +---------+---------------+---------+-----------+----------+--------------+  POP       Full            Yes       Yes                                    +---------+---------------+---------+-----------+----------+--------------+  PTV       Full                                                             +---------+---------------+---------+-----------+----------+--------------+  PERO      Full                                                             +---------+---------------+---------+-----------+----------+--------------+   +---------+---------------+---------+-----------+----------+--------------+  LEFT      Compressibility Phasicity Spontaneity Properties Thrombus Aging   +---------+---------------+---------+-----------+----------+--------------+  CFV       Full            Yes       Yes                                    +---------+---------------+---------+-----------+----------+--------------+  SFJ       Full                                                             +---------+---------------+---------+-----------+----------+--------------+  FV Prox   Full                                                             +---------+---------------+---------+-----------+----------+--------------+  FV Mid    Full                                                             +---------+---------------+---------+-----------+----------+--------------+  FV Distal Full                                                             +---------+---------------+---------+-----------+----------+--------------+  PFV       Full                                                             +---------+---------------+---------+-----------+----------+--------------+  POP       Full            Yes       Yes                                    +---------+---------------+---------+-----------+----------+--------------+  PTV       Full                                                             +---------+---------------+---------+-----------+----------+--------------+  PERO      Partial                                          Acute           +---------+---------------+---------+-----------+----------+--------------+  Soleal    Full                                             Acute           +---------+---------------+---------+-----------+----------+--------------+     Summary: Right: There is no evidence of deep vein thrombosis in the lower extremity. No cystic structure found in the popliteal fossa. Left: Findings consistent with acute deep vein thrombosis involving the left peroneal veins, and left soleal veins. Short segment of localized thrombus in the peroneal and soleal veins in the mid segment.  *See  table(s) above for measurements and observations. Electronically signed by Ruta Hinds MD on 04/09/2019 at 2:56:25 PM.    Final

## 2019-04-22 NOTE — Plan of Care (Signed)

## 2019-04-22 NOTE — Progress Notes (Signed)
Pharmacy Antibiotic Note  Dustin Diaz is a 74 y.o. 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 Noted risk factors of chronic prednisone/Actemra doses 1/15 and 1/16.    Pharmacy has been consulted for amphotericin B dosing.   Scr today is increased to 0.86 - Fluid boluses decreased yesterday to 250 mL per MD request for pre and post amphotericin B dose due to fluid overload.  MD is still concerned about fluid overload today and has given Lasix 40mg  PO x1 dose.  Per discussion with Dr. Candiss Norse, [place] to increase fluid bolus to 32mL (but did not want to increase to 517mL boluses) today and monitor response tomorrow.  Magnesium is increased to 2.3. Potassium is stable at 3.9 - will repeat KCl 20 mEq, and hold on Magnesium replacement. Potassium and Magnesium will be monitored daily and replaced by pharmacy per protocol.  Plan: KCl 20 mEq PO x1 dose today. Liposomal amphotericin B 3 mg/kg (230 mg) every 24 hours  Normal saline 365mL 1 hour prior to dose and immediately after. Dextrose flushes between saline boluses and amphotericin B  Daily K and Mg labs Pharmacy will replace per protocol     Height: 5\' 10"  (177.8 cm) Weight: 171 lb 15.3 oz (78 kg) IBW/kg (Calculated) : 73  Temp (24hrs), Avg:98.1 F (36.7 C), Min:97.8 F (36.6 C), Max:98.5 F (36.9 C)  Recent Labs  Lab 04/17/19 0155 04/17/19 0155 04/18/19 0238 04/19/19 0135 04/20/19 0200 04/21/19 0202 04/22/19 0155  WBC 17.1*  --  14.4* 13.6* 10.9*  --  10.7*  CREATININE 0.78   < > 0.83 0.70 0.67 0.67 0.86   < > = values in this interval not displayed.    Estimated Creatinine Clearance: 80.2 mL/min (by C-G formula based on SCr of 0.86 mg/dL).    Allergies  Allergen Reactions  . Codeine       Thank you for allowing pharmacy to be a part of this patient's care.  Gretta Arab PharmD, BCPS Clinical pharmacist phone 7am- 5pm: [phone] 04/22/2019 7:55 AM

## 2019-04-22 NOTE — Progress Notes (Signed)
ID PROGRESS NOTE  Cultures thus far negative from skin biopsy Awaiting path to review slides, should be ready by 2/1 to decide to further treat Patient is tolerating L-ampho presently  Continue Lipo ampho for now and await path results for further treatment choices  Yoko Mcgahee B. [place] [facility] [phone]

## 2019-04-23 LAB — GLUCOSE, CAPILLARY
Glucose-Capillary: 171 mg/dL — ABNORMAL HIGH (ref 70–99)
Glucose-Capillary: 193 mg/dL — ABNORMAL HIGH (ref 70–99)
Glucose-Capillary: 130 mg/dL — ABNORMAL HIGH (ref 70–99)
Glucose-Capillary: 185 mg/dL — ABNORMAL HIGH (ref 70–99)
Glucose-Capillary: 114 mg/dL — ABNORMAL HIGH (ref 70–99)
Glucose-Capillary: 140 mg/dL — ABNORMAL HIGH (ref 70–99)

## 2019-04-23 LAB — CBC WITH DIFFERENTIAL/PLATELET
Abs Immature Granulocytes: 0.19 10*3/uL — ABNORMAL HIGH (ref 0.00–0.07)
Basophils Absolute: 0.1 10*3/uL (ref 0.0–0.1)
Basophils Relative: 1 %
Eosinophils Absolute: 0.5 10*3/uL (ref 0.0–0.5)
Eosinophils Relative: 5 %
HCT: 41.6 % (ref 39.0–52.0)
Hemoglobin: 13.9 g/dL (ref 13.0–17.0)
Immature Granulocytes: 2 %
Lymphocytes Relative: 12 %
Lymphs Abs: 1.2 10*3/uL (ref 0.7–4.0)
MCH: 30.6 pg (ref 26.0–34.0)
MCHC: 33.4 g/dL (ref 30.0–36.0)
MCV: 91.6 fL (ref 80.0–100.0)
Monocytes Absolute: 1.4 10*3/uL — ABNORMAL HIGH (ref 0.1–1.0)
Monocytes Relative: 14 %
Neutro Abs: 6.9 10*3/uL (ref 1.7–7.7)
Neutrophils Relative %: 66 %
Platelets: 220 10*3/uL (ref 150–400)
RBC: 4.54 MIL/uL (ref 4.22–5.81)
RDW: 14.4 % (ref 11.5–15.5)
WBC: 10.3 10*3/uL (ref 4.0–10.5)
nRBC: 0 % (ref 0.0–0.2)

## 2019-04-23 LAB — D-DIMER, QUANTITATIVE: D-Dimer, Quant: 2.97 ug/mL-FEU — ABNORMAL HIGH (ref 0.00–0.50)

## 2019-04-23 LAB — COMPREHENSIVE METABOLIC PANEL
ALT: 18 U/L (ref 0–44)
AST: 18 U/L (ref 15–41)
Albumin: 3.3 g/dL — ABNORMAL LOW (ref 3.5–5.0)
Alkaline Phosphatase: 65 U/L (ref 38–126)
Anion gap: 8 (ref 5–15)
BUN: 29 mg/dL — ABNORMAL HIGH (ref 8–23)
CO2: 27 mmol/L (ref 22–32)
Calcium: 8.5 mg/dL — ABNORMAL LOW (ref 8.9–10.3)
Chloride: 99 mmol/L (ref 98–111)
Creatinine, Ser: 1.16 mg/dL (ref 0.61–1.24)
GFR calc Af Amer: >60 mL/min (ref >60)
GFR calc non Af Amer: >60 mL/min (ref >60)
Glucose, Bld: 115 mg/dL — ABNORMAL HIGH (ref 70–99)
Potassium: 4.5 mmol/L (ref 3.5–5.1)
Sodium: 134 mmol/L — ABNORMAL LOW (ref 135–145)
Total Bilirubin: 1.6 mg/dL — ABNORMAL HIGH (ref 0.3–1.2)
Total Protein: 5.9 g/dL — ABNORMAL LOW (ref 6.5–8.1)

## 2019-04-23 LAB — C-REACTIVE PROTEIN: CRP: 0.6 mg/dL (ref <1.0)

## 2019-04-23 LAB — BRAIN NATRIURETIC PEPTIDE: B Natriuretic Peptide: 41 pg/mL (ref 0.0–100.0)

## 2019-04-23 LAB — MAGNESIUM: Magnesium: 2.3 mg/dL (ref 1.7–2.4)

## 2019-04-23 LAB — SURGICAL PATHOLOGY

## 2019-04-23 MED ORDER — FUROSEMIDE 10 MG/ML IJ SOLN: 40.0000 mg | Freq: Once | INTRAMUSCULAR | Status: DC

## 2019-04-23 MED ORDER — DEXTROSE 5 % IV SOLN
5.0000 mg/kg | Freq: Three times a day (TID) | INTRAVENOUS | Status: DC
  Administered 2019-04-23 – 2019-04-26 (×9): 390 mg via INTRAVENOUS
  Filled 2019-04-23 (×14): qty 7.8

## 2019-04-23 MED ORDER — ACYCLOVIR 5 % EX OINT
TOPICAL_OINTMENT | Freq: Four times a day (QID) | CUTANEOUS | Status: DC
  Administered 2019-04-23 – 2019-04-29 (×5): 1 via TOPICAL
  Filled 2019-04-23: qty 15
  Filled 2019-04-23: qty 5

## 2019-04-23 NOTE — Progress Notes (Signed)
PT Cancellation Note  Patient Details Name: Dustin Diaz MRN: [id number] DOB: 1946/02/28   Cancelled Treatment:    Reason Eval/Treat Not Completed: Other (comment)in transit to 3rd floor. Will check back in AM.   Claretha Cooper 04/23/2019, 3:48 PM  [place]  Office [phone]

## 2019-04-23 NOTE — Progress Notes (Signed)
Occupational Therapy Treatment Patient Details Name: Dustin Diaz MRN: [id number] DOB: July 20, 1945 Today's Date: 04/23/2019    History of present illness 74 year old male admitted with Covid /Pneumonia. PMH includes DM, HTN, hyperlipidemia, polymyalgia rheumatica.   OT comments  Pt making slow steady progress, however required increased supplemental oxygen this date during activity. Pt on 2L [place] with SpO2 93% in supine. Pt tolerated sitting edge of bed ~10 min with SpO2 decreasing to 83% and pt unable to recovery. Titrated pt up to 4L [place] with SpO2 90%. Continued education with pt on breathing techniques and energy conservation strategies. Pt able to ambulate to/from bathroom with RW and min guard. Pt reporting max shortness of breath with O2 titrated up to 10L [place] with no change in breathing. 15L NRB donned with pt reporting improvement. SpO2 87%. Pt titrated back down to 5L [place] with SpO2 fluctuating between 88-89%. RN updated. Encouraged pursed lip breathing throughout with poor to fair follow through. Encouraged use of flutter valve and incentive spirometer with fair follow through. Pt frustrated this session and not receptive to education provided. OT will continue to follow acutely.    Follow Up Recommendations  Supervision - Intermittent;No OT follow up    Equipment Recommendations  None recommended by OT    Recommendations for Other Services      Precautions / Restrictions Precautions Precautions: Fall;Other (comment) Precaution Comments: monitor O2 Sats Restrictions Weight Bearing Restrictions: No       Mobility Bed Mobility Overal bed mobility: Modified Independent             General bed mobility comments: HOB elevated, use of bedrail  Transfers Overall transfer level: Needs assistance Equipment used: Rolling walker (2 wheeled) Transfers: Sit to/from Stand Sit to Stand: Min guard         General transfer comment: Noted 0 instances of LOB, however pt unsteady  on feet    Balance Overall balance assessment: Needs assistance Sitting-balance support: Feet supported Sitting balance-Leahy Scale: Good       Standing balance-Leahy Scale: Fair                             ADL either performed or assessed with clinical judgement   ADL Overall ADL's : Needs assistance/impaired Eating/Feeding: Independent;Sitting   Grooming: Set up;Sitting                   Toilet Transfer: Supervision/safety;Ambulation;Grab bars;Regular Museum/gallery exhibitions officer and Hygiene: Supervision/safety;Sit to/from stand       Functional mobility during ADLs: Min guard;Rolling walker General ADL Comments: Pt able to ambulate to/from bathroom with RW and min guard. Pt very tired today, desats quickly.     Vision       Perception     Praxis      Cognition Arousal/Alertness: Awake/alert Behavior During Therapy: Flat affect Overall Cognitive Status: Within Functional Limits for tasks assessed                                 General Comments: Pt frustrated during session. Pt has been here 17 days. Pt less receptive to education this date.        Exercises Exercises: Other exercises Other Exercises Other Exercises: Encouraged pursed lip breathing with poor to fair follow through. Other Exercises: Incentive spirometer x 10 with mod to max cues on technique. Pulling 524mL.  Pt appears to not be receptive to education. Other Exercises: Flutter valve x 10   Shoulder Instructions       General Comments Pt on 2L [place] at start of session with SpO2 93%. Pt sat EOB with SpO2 decreasing to 83% with pt unable to recover. Titrated pt up to 4L [place] with Spo2 90%. Pt able to ambulate to/from bathroom. Pt reporting max SOB with oxygen titrated up to 10L [place]. Pt continuing to report max SOB with 15L NRB donned. SpO2 87% with activity. Titrated pt back down to 5L [place] at end of session with SpO2 fluctuating between 88-89%.      Pertinent Vitals/ Pain       Pain Assessment: 0-10 Pain Score: 6  Pain Location: mouth. lips, neck sores Pain Descriptors / Indicators: Burning;Constant;Discomfort Pain Intervention(s): Monitored during session  Home Living                                          Prior Functioning/Environment              Frequency           Progress Toward Goals  OT Goals(current goals can now be found in the care plan section)  Progress towards OT goals: Progressing toward goals  ADL Goals Additional ADL Goal #1: Pt will complete ADL task with SpO2 maintianing above 88 independently using pursed lip breathing techniques Additional ADL Goal #2: Pt will independently verbalize 3 energy conservation strategies  Plan Discharge plan remains appropriate    Co-evaluation                 AM-PAC OT "6 Clicks" Daily Activity     Outcome Measure   Help from another person eating meals?: None Help from another person taking care of personal grooming?: A Little Help from another person toileting, which includes using toliet, bedpan, or urinal?: A Little Help from another person bathing (including washing, rinsing, drying)?: A Little Help from another person to put on and taking off regular upper body clothing?: A Little Help from another person to put on and taking off regular lower body clothing?: A Little 6 Click Score: 19    End of Session Equipment Utilized During Treatment: Oxygen;Rolling walker  OT Visit Diagnosis: Unsteadiness on feet (R26.81);Muscle weakness (generalized) (M62.81)   Activity Tolerance Patient limited by fatigue(Limited by SOB)   Patient Left in chair;with call bell/phone within reach   Nurse Communication Mobility status        Time: [id number] OT Time Calculation (min): 30 min  Charges: OT General Charges $OT Visit: 1 Visit OT Treatments $Therapeutic Activity: 23-37 mins  Mauri Brooklyn OTR/L [phone]   Mauri Brooklyn 04/23/2019, 8:38 AM

## 2019-04-23 NOTE — Progress Notes (Signed)
Pt placed back on 2L for O2 sats 79-80% on room air at rest. Pt now with O2 sat of 90% on 2L at rest. No shortness of breath noted. Denies discomfort. Will continue plan of care.

## 2019-04-23 NOTE — Progress Notes (Addendum)
Pharmacy Antibiotic Note  Dustin Diaz is a 74 y.o. 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 Noted risk factors of chronic prednisone/Actemra doses 1/15 and 1/16.    Pharmacy has been consulted for amphotericin B dosing.   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 D/w Dr. Candiss Norse and we will transition ampho B to acyclovir. We will use the non-encephalitis dose to avoid nephrotoxicity. We can transition to PO Valtrex when ready for PO  Scr 1.16 CrCl~60  Plan: Dc ampotericin B Acyclovir 5mg /kg/dose (390mg ) IV q8 F/u with Scr   Height: 5\' 10"  (177.8 cm) Weight: 171 lb 15.3 oz (78 kg) IBW/kg (Calculated) : 73  Temp (24hrs), Avg:97.9 F (36.6 C), Min:97.8 F (36.6 C), Max:98.1 F (36.7 C)  Recent Labs  Lab 04/18/19 0238 04/18/19 0238 04/19/19 0135 04/20/19 0200 04/21/19 0202 04/22/19 0155 04/23/19 0348  WBC 14.4*  --  13.6* 10.9*  --  10.7* 10.3  CREATININE 0.83   < > 0.70 0.67 0.67 0.86 1.16   < > = values in this interval not displayed.    Estimated Creatinine Clearance: 59.4 mL/min (by C-G formula based on SCr of 1.16 mg/dL).    Allergies  Allergen Reactions  . Codeine     Onnie Boer, PharmD, [place], AAHIVP, CPP Infectious Disease Pharmacist 04/23/2019 1:53 PM

## 2019-04-23 NOTE — Progress Notes (Signed)
Pt transferred to 323 with all belongings.

## 2019-04-23 NOTE — Progress Notes (Addendum)
Patient ID: Dustin Diaz, male   DOB: May 04, 1945, 74 y.o.   MRN: [id number]          [facility]    Date of Admission:  04/05/2019   Day 6 amphotericin          [name] as a new ulcerated skin lesions on his right neck and lower lip in the setting of recent Covid pneumonia and chronic prednisone therapy.  I cannot tell from other providers notes whether or not these lesions have improved on empiric amphotericin.  He underwent a skin biopsy on 04/18/2019.  No organisms were seen on Gram, AFB or fungal stains.  All cultures are negative so far.  Cryptococcal and histoplasma antigens are negative.  Pathology review is pending.  His creatinine has come up to 1.16 today.  I will continue amphotericin for now pending review of pathology findings.  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  Amphotericin B has been stopped and IV acyclovir has been started.  Once the lesions are improving he can be switched to oral valacyclovir to complete 10 to 14 days of total therapy.  Please call if we can be of further assistance.         Michel Bickers, MD [facility] [place] [facility] [phone] pager   [phone] cell 04/23/2019, 12:32 PM

## 2019-04-23 NOTE — Progress Notes (Signed)
Report given to Alexis RN

## 2019-04-23 NOTE — Progress Notes (Signed)
PROGRESS NOTE                                                                                                                                                                                                             Patient Demographics:    Dustin Diaz, is a 74 y.o. male, DOB - May 07, 1945, [id number]  Outpatient Primary MD for the patient is Lavone Orn, MD   Admit date - 04/05/2019   LOS - 52  Chief Complaint  Patient presents with  . Covid positive  . low O2       Brief Narrative:  Patient is a 74 y.o. 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  Hospital course complicated by slow improvement in hypoxia,lower extremity DVT, and nodular ulcerated skin lesions raising concern for invasive fungal infection.  See below for further details.   Subjective:   Patient in recliner having breakfast, appears comfortable, denies any headache, no fever, no chest pain or pressure, improved at rest shortness of breath , no abdominal pain. No focal weakness.  His new lesions which are appearing on the back of his neck are slightly painful.   Assessment  & Plan :   Acute Hypoxic Resp Failure due to Covid 19 Viral pneumonia: Slowly improved-down to just 2 L at rest this morning-but gets very dyspneic with minimal movement.  Has required around 10-15 L of oxygen with ambulation-in the past few sessions with physical therapy.  Has completed a course of remdesivir-and has been slowly weaned back to his usual home regimen of prednisone.  O2 requirements:  SpO2: (!) 88 % O2 Flow Rate (L/min): 2 L/min FiO2 (%): 100 %   COVID-19 Labs: Recent Labs    04/22/19 0155 04/23/19 0348  DDIMER 3.19* 2.97*  CRP 0.9 0.6       Component Value Date/Time   BNP 41.0  04/23/2019 0348    No results for input(s): PROCALCITON in the last 168 hours.  No results found for: SARSCOV2NAA   COVID-19 Medications: Decadron: 1/14>>1/23 Remdesivir: 1/14>> 1/18 Actemra: 1/15 x 1, 1/16 x 1  Antibiotics: Vancomycin 1/26>> 1/27 Cefepime 1/26>> 1/27  Prone/Incentive Spirometry: encouraged  incentive spirometry use 3-4/hour.  Nodular  cavitary skin lesions in the right neck area: A few days back-developed what looked like papular lesions on the right side of his neck that progressed to a nodular lesion that has since ulcerated in the center.  Given his chronic prednisone use- Actemra use-concern for invasive fungal infection.  Serum cryptococcal antigen negative.  Punch biopsy done on 1/28-remains culture negative, biopsy results discussed with pathology on 04/23/2019 and also informed general surgery about it, trying to track down the right specimen and results, pathologist will call me back as soon as some detailed information is available.  CT head/chest/abdomen ordered as recommended by ID.  Following-patient currently on IV amphotericin.    Antifungal agents:  Amphotericin B 1/27>>  Fungal blood cultures on 1/27: Negative so far  Pending tests: Right neck tissue biopsy, aerobic/anaerobic cultures/fungal and AFB cultures Urine histoplasma antigen, blastomycosis antigen, histoplasma galactomannan   Left lower extremity DVT: Continue Eliquis-CTA chest on 1/18 without PE.  DM-2: CBG stable-continue Lantus 6 units and NovoLog.  Follow.  CBG (last 3)  Recent Labs    04/22/19 1542 04/22/19 2015 04/23/19 0729  GLUCAP 206* 261* 130*    HTN: BP controlled-have discontinued all antihypertensives on 1/26-due to dizziness upon standing.  Dyslipidemia: Continue statin  Bibasilar Rales on exam suggestive of fluid overload.  Trial of IV Lasix and monitor.  History of PMR: Continue prednisone-dose gradually taper down back to his usual home regimen of 3  mg.  Right hilar lymphadenopathy: Likely reactive-repeat imaging in 3 to 6 months as outpatient.  ? Pancreatic Cyst on CT - PCP guided follow up.  Nutrition Problem: Nutrition Problem: Increased nutrient needs Etiology: catabolic [id number] PNA) Signs/Symptoms: estimated needs Interventions: Hormel Shake    Consults  :  CCS, ID  Procedures  :   1/28>> Punch biopsy right neck lesion by general surgery - prelim results noted appear to be inconclusive, have requested surgery to evaluate the report, also discussed with pathologist on call on 04/23/2019 at 11:30 AM see discussion above.  Cultures negative so far.   Condition - Fair  Family Communication  :  Previous MD  -  Wife updated 04/22/2019  Code Status :  Full Code  Diet :  Diet Order            Diet heart healthy/carb modified Room service appropriate? Yes; Fluid consistency: Thin  Diet effective now               Disposition Plan  :  Remain hospitalized-Home when hypoxia improves.  Barriers to discharge: Hypoxia requiring O2 supplementation/remains on IV amphotericin B (see assessment/plan section for further details)  Antimicorbials  :    Anti-infectives (From admission, onward)   Start     Dose/Rate Route Frequency Ordered Stop   04/18/19 1800  amphotericin B liposome (AMBISOME) 230 mg in dextrose 5 % 500 mL IVPB     3 mg/kg  78 kg 278.8 mL/hr over 120 Minutes Intravenous Every 24 hours 04/18/19 1447     04/18/19 1000  vancomycin (VANCOCIN) IVPB 1000 mg/200 mL premix  Status:  Discontinued     1,000 mg 200 mL/hr over 60 Minutes Intravenous Every 12 hours 04/17/19 1400 04/18/19 1138   04/17/19 1500  ceFEPIme (MAXIPIME) 2 g in sodium chloride 0.9 % 100 mL IVPB  Status:  Discontinued     2 g 200 mL/hr over 30 Minutes Intravenous Every 8 hours 04/17/19 1359 04/18/19 1335   04/17/19 1430  vancomycin (VANCOREADY) IVPB 1500 mg/300 mL  1,500 mg 150 mL/hr over 120 Minutes Intravenous  Once 04/17/19 1400  04/17/19 1825   04/06/19 1000  remdesivir 100 mg in sodium chloride 0.9 % 100 mL IVPB  Status:  Discontinued     100 mg 200 mL/hr over 30 Minutes Intravenous Daily 04/05/19 1919 04/05/19 1919   04/06/19 1000  remdesivir 100 mg in sodium chloride 0.9 % 100 mL IVPB     100 mg 200 mL/hr over 30 Minutes Intravenous Daily 04/05/19 1919 04/09/19 0948   04/06/19 0400  azithromycin (ZITHROMAX) 500 mg in sodium chloride 0.9 % 250 mL IVPB  Status:  Discontinued     500 mg 250 mL/hr over 60 Minutes Intravenous Every 24 hours 04/06/19 0242 04/06/19 0725   04/06/19 0300  cefTRIAXone (ROCEPHIN) 1 g in sodium chloride 0.9 % 100 mL IVPB  Status:  Discontinued     1 g 200 mL/hr over 30 Minutes Intravenous Every 24 hours 04/06/19 0242 04/06/19 0725   04/05/19 2100  remdesivir 200 mg in sodium chloride 0.9% 250 mL IVPB     200 mg 580 mL/hr over 30 Minutes Intravenous Once 04/05/19 1919 04/05/19 2200   04/05/19 1918  remdesivir 200 mg in sodium chloride 0.9% 250 mL IVPB  Status:  Discontinued     200 mg 580 mL/hr over 30 Minutes Intravenous Once 04/05/19 1919 04/05/19 1919     DVT Prophylaxis  : Eliquis  Inpatient Medications  Scheduled Meds: . apixaban  5 mg Oral BID  . vitamin C  500 mg Oral Daily  . atorvastatin  10 mg Oral Daily  . dextrose  10 mL Intravenous Q24H  . dextrose  10 mL Intravenous Q24H  . docusate sodium  200 mg Oral BID  . fluticasone  2 spray Each Nare Daily  . insulin aspart  0-15 Units Subcutaneous TID WC  . insulin aspart  0-5 Units Subcutaneous QHS  . insulin aspart  4 Units Subcutaneous TID WC  . insulin glargine  6 Units Subcutaneous QHS  . Ipratropium-Albuterol  1 puff Inhalation Q6H  . latanoprost  1 drop Both Eyes QHS  . pantoprazole  40 mg Oral Daily  . predniSONE  3 mg Oral Daily  . zinc sulfate  220 mg Oral Daily   Continuous Infusions: . amphotericin  B  Liposome (AMBISOME) ADULT IV Stopped (04/22/19 1933)  . sodium chloride 350 mL (04/22/19 1618)  . sodium  chloride Stopped (04/22/19 2036)   PRN Meds:.acetaminophen, ALPRAZolam, chlorpheniramine-HYDROcodone, diphenhydrAMINE **OR** diphenhydrAMINE, guaiFENesin-dextromethorphan, lip balm, magic mouthwash w/lidocaine, meperidine (DEMEROL) injection, [DISCONTINUED] ondansetron **OR** ondansetron (ZOFRAN) IV, sodium chloride, sodium chloride flush, traMADol   Time Spent in minutes  25  See all Orders from today for further details   Lala Lund M.D on 04/23/2019 at 11:32 AM  To page go to [email or web address] - use universal password  [facility] -  Office  [phone]    Objective:   Vitals:   04/22/19 0716 04/22/19 1928 04/23/19 0426 04/23/19 0730  BP: 139/76 (!) 120/57 134/69 (!) 143/78  Pulse: 73 81 79 77  Resp: 18 18 17    Temp: 98.1 F (36.7 C) 97.8 F (36.6 C) 97.8 F (36.6 C) 98.1 F (36.7 C)  TempSrc: Oral Oral Oral Oral  SpO2: 98% 93% 91% (!) 88%  Weight:      Height:        Wt Readings from Last 3 Encounters:  04/06/19 78 kg  07/24/12 93 kg     Intake/Output Summary (Last 24 hours) at  04/23/2019 1132 Last data filed at 04/23/2019 1020 Gross per 24 hour  Intake 1090 ml  Output 1150 ml  Net -60 ml    Physical Exam  Awake Alert,   No new F.N deficits, Normal affect [place].AT,PERRAL Supple Neck,No JVD, No cervical lymphadenopathy appriciated.  Symmetrical Chest wall movement, Good air movement bilaterally, CTAB RRR,No Gallops, Rubs or new Murmurs, No Parasternal Heave +ve B.Sounds, Abd Soft, No tenderness, No organomegaly appriciated, No rebound - guarding or rigidity.  Patient has multiple nodular lesions one on his lower lip, 2 on the right lateral aspect of his neck, 1 on his occipital area and 2 putting lesions on the back of his neck, pictures below  Skin: See pictures below (taken on 1/27)                      Data Review:    CBC Recent Labs  Lab 04/18/19 0238 04/19/19 0135 04/20/19 0200 04/22/19 0155 04/23/19 0348  WBC 14.4*  13.6* 10.9* 10.7* 10.3  HGB 14.1 14.0 15.0 13.9 13.9  HCT 42.1 41.6 47.4 41.6 41.6  PLT 233 227 293 241 220  MCV 90.7 90.8 97.1 90.6 91.6  MCH 30.4 30.6 30.7 30.3 30.6  MCHC 33.5 33.7 31.6 33.4 33.4  RDW 14.1 14.1 14.7 14.1 14.4  LYMPHSABS  --   --   --  1.0 1.2  MONOABS  --   --   --  1.5* 1.4*  EOSABS  --   --   --  0.4 0.5  BASOSABS  --   --   --  0.1 0.1    Chemistries  Recent Labs  Lab 04/18/19 0238 04/18/19 0238 04/19/19 0135 04/20/19 0200 04/21/19 0202 04/22/19 0155 04/23/19 0348  NA 136   < > 133* 134* 135 135 134*  K 4.6   < > 4.0 4.4 3.9 3.9 4.5  CL 102   < > 102 103 101 99 99  CO2 26   < > 23 23 24 27 27   GLUCOSE 110*   < > 116* 112* 114* 119* 115*  BUN 30*   < > 24* 23 23 23  29*  CREATININE 0.83   < > 0.70 0.67 0.67 0.86 1.16  CALCIUM 8.4*   < > 8.3* 8.3* 8.6* 8.4* 8.5*  MG  --   --  2.1 2.1 2.1 2.3 2.3  AST 18  --  19 24  --  20 18  ALT 15  --  18 18  --  19 18  ALKPHOS 78  --  71 72  --  68 65  BILITOT 0.9  --  1.7* 1.2  --  1.2 1.6*   < > = values in this interval not displayed.   ------------------------------------------------------------------------------------------------------------------ No results for input(s): CHOL, HDL, LDLCALC, TRIG, CHOLHDL, LDLDIRECT in the last 72 hours.  Lab Results  Component Value Date   HGBA1C 7.1 (H) 04/06/2019   ------------------------------------------------------------------------------------------------------------------ No results for input(s): TSH, T4TOTAL, T3FREE, THYROIDAB in the last 72 hours.  Invalid input(s): FREET3 ------------------------------------------------------------------------------------------------------------------ No results for input(s): VITAMINB12, FOLATE, FERRITIN, TIBC, IRON, RETICCTPCT in the last 72 hours.  Coagulation profile No results for input(s): INR, PROTIME in the last 168 hours.  Recent Labs    04/22/19 0155 04/23/19 0348  DDIMER 3.19* 2.97*    Cardiac Enzymes No  results for input(s): CKMB, TROPONINI, MYOGLOBIN in the last 168 hours.  Invalid input(s): CK ------------------------------------------------------------------------------------------------------------------    Component Value Date/Time   BNP 41.0  04/23/2019 0348    Micro Results Recent Results (from the past 240 hour(s))  MRSA PCR Screening     Status: None   Collection Time: 04/17/19  4:17 PM   Specimen: Nasal Mucosa; Nasopharyngeal  Result Value Ref Range Status   MRSA by PCR NEGATIVE NEGATIVE Final    Comment:        The GeneXpert MRSA Assay (FDA approved for NASAL specimens only), is one component of a comprehensive MRSA colonization surveillance program. It is not intended to diagnose MRSA infection nor to guide or monitor treatment for MRSA infections. Performed at [facility], [place] [address], [place], [place] [address]   Fungus culture, blood     Status: None (Preliminary result)   Collection Time: 04/18/19  3:00 PM   Specimen: BLOOD  Result Value Ref Range Status   Specimen Description   Final    BLOOD RIGHT ANTECUBITAL Performed at [place] [address] [place], [place] [address]    Special Requests   Final    BOTTLES DRAWN AEROBIC ONLY Blood Culture adequate volume Performed at [place] [address] [place], [place] [address]    Culture   Final    NO GROWTH 4 DAYS Performed at [facility], [place] [address], [place], [place] [address]    Report Status PENDING  Incomplete  Fungus culture, blood     Status: None (Preliminary result)   Collection Time: 04/18/19  3:05 PM   Specimen: BLOOD RIGHT HAND  Result Value Ref Range Status   Specimen Description   Final    BLOOD RIGHT HAND Performed at [facility] [address], [place], [place] [address]    Special Requests   Final    BOTTLES DRAWN AEROBIC ONLY Blood Culture adequate volume Performed at [facility] [address], [place], [place] [address]    Culture   Final    NO GROWTH 4 DAYS Performed at [facility], [place] [address], [place], [place] [address]    Report Status PENDING  Incomplete  Aerobic/Anaerobic Culture (surgical/deep wound)     Status: None (Preliminary result)   Collection Time: 04/19/19 11:39 AM   Specimen: Biopsy; Tissue  Result Value Ref Range Status   Specimen Description   Final    BIOPSY RT NECK Performed at [facility] [address] [place], [place] [address]    Special Requests   Final    Immunocompromised Performed at [facility], [place] [address], [place], [place] [address]    Gram Stain NO WBC SEEN NO ORGANISMS SEEN   Final   Culture   Final    NO GROWTH 3 DAYS Performed at [facility], [place] [address] [place], [place] [address]    Report Status PENDING  Incomplete  Acid Fast Smear (AFB)     Status: None   Collection Time: 04/19/19 11:39 AM   Specimen: Neck, Right  Result Value Ref Range Status   AFB Specimen Processing Concentration  Final   Acid Fast Smear Negative  Final    Comment: (NOTE) Performed At: [facility] [place], [place] [id number] Rush Farmer MD [id number]    Source (AFB) BIOPSY  Final    Comment: RT NECK Performed at [facility], [place] [address] [place], [place] [address]   Fungus Culture With Stain     Status: None (Preliminary result)  Collection Time: 04/19/19 11:39 AM   Specimen: Neck, Right  Result Value Ref Range Status   Fungus Stain Final report  Final    Comment: (NOTE) Performed At: [facility] [place], [place] [id number] Rush Farmer MD [id number]    Fungus (Mycology) Culture PENDING  Incomplete   Fungal Source BIOPSY  Final    Comment: RT NECK Performed at [facility], [place] [address] [place], [place] [address]   Fungus Culture Result      Status: None   Collection Time: 04/19/19 11:39 AM  Result Value Ref Range Status   Result 1 Comment  Final    Comment: (NOTE) KOH/Calcofluor preparation:  no fungus observed. Performed At: [facility] [place], [place] [id number] Rush Farmer MD [id number]     Radiology Reports CT HEAD W & WO CONTRAST  Result Date: 04/20/2019 CLINICAL DATA:  Suspected sinusitis EXAM: CT HEAD WITHOUT AND WITH CONTRAST TECHNIQUE: Contiguous axial images were obtained from the base of the skull through the vertex without and with intravenous contrast CONTRAST:  139mL OMNIPAQUE IOHEXOL 350 MG/ML SOLN COMPARISON:  None. FINDINGS: Brain: There is no acute intracranial hemorrhage, mass-effect, or edema. Gray-white differentiation is preserved. Patchy hypoattenuation in the supratentorial white matter is nonspecific but may reflect mild chronic microvascular ischemic changes. There is no extra-axial fluid collection. Ventricles and sulci are within normal limits in size and configuration. Vascular: There is atherosclerotic calcification at the skull base. Skull: Calvarium is unremarkable. Sinuses/Orbits: Minimal patchy retained secretions left ethmoid sinuses. Otherwise trace paranasal sinus mucosal thickening. There is evidence chronic left maxillary sinus inflammation. Orbits are unremarkable. Other: None. IMPRESSION: No acute intracranial abnormality. Mild chronic microvascular ischemic changes. No significant paranasal sinus opacification. Electronically Signed   By: Macy Mis M.D.   On: 04/20/2019 14:32   CT CHEST W CONTRAST  Result Date: 04/20/2019 CLINICAL DATA:  Acute hypoxic respiratory failure due to COVID pneumonia. Febrile illness. EXAM: CT CHEST, ABDOMEN, AND PELVIS WITH CONTRAST TECHNIQUE: Multidetector CT imaging of the chest, abdomen and pelvis was performed following the standard protocol during bolus administration of intravenous contrast. CONTRAST:  152mL OMNIPAQUE  IOHEXOL 350 MG/ML SOLN COMPARISON:  Chest CT 04/09/2019 FINDINGS: CT CHEST FINDINGS Cardiovascular: The heart is mildly enlarged but stable. Stable tortuosity, ectasia and calcification of the thoracic aorta. Stable scattered coronary artery calcifications. Mediastinum/Nodes: Persistent scattered mediastinal and hilar lymph nodes. The esophagus is grossly normal. Lungs/Pleura: Exam limited by breathing motion artifact but there is persistent diffuse interstitial and airspace disease in both lungs. The infiltrates demonstrate progressive consolidation versus the more ground-glass appearance on the prior study. No definite pleural effusions or pleural lesions. No pneumothorax. Musculoskeletal: No significant findings. CT ABDOMEN PELVIS FINDINGS Examination is limited due to breathing motion artifact. Hepatobiliary: No focal hepatic lesions or intrahepatic biliary dilatation. The gallbladder is normal. No common bile duct dilatation. Pancreas: Small cystic area noted in the pancreatic head which is likely a benign postinflammatory cyst. There is a second exophytic cyst projecting off the body tail junction region. No findings for acute pancreatitis and no ductal dilatation. Spleen: Normal size. No focal lesions. Adrenals/Urinary Tract: The adrenal glands and kidneys are grossly normal. Small left renal cyst noted. No hydronephrosis. The bladder is unremarkable. Stomach/Bowel: The stomach, duodenum, small bowel and colon are grossly normal. No acute inflammatory changes, mass lesions or obstructive findings. Moderate stool throughout the colon and down into the rectum suggesting constipation Vascular/Lymphatic: Moderate atherosclerotic calcifications involving the aorta  and branch vessels but no aneurysm. No mesenteric or retroperitoneal mass or adenopathy. Reproductive: Mild prostate gland enlargement. The seminal vesicles appear normal. Other: No pelvic mass or free pelvic fluid collections. No inguinal mass or  adenopathy. Musculoskeletal: No significant bony findings. IMPRESSION: 1. Persistent diffuse interstitial and airspace disease in both lungs. The infiltrates demonstrate progressive consolidation versus the more ground-glass appearance on the prior study. 2. Stable mediastinal and hilar lymph nodes. 3. No acute abdominal/pelvic findings, mass lesions or adenopathy. 4. Cystic lesions in the pancreas, likely benign postinflammatory cysts but will need follow-up once the patient is no longer acutely ill. MRI of the abdomen would be helpful for further evaluation if possible at some point. 5. Moderate stool throughout the colon and down into the rectum suggesting constipation. Electronically Signed   By: Marijo Sanes M.D.   On: 04/20/2019 14:30   CT ANGIO CHEST PE W OR WO CONTRAST  Result Date: 04/09/2019 CLINICAL DATA:  COVID pneumonia, hypoxia, shortness of breath and elevated D-dimer. EXAM: CT ANGIOGRAPHY CHEST WITH CONTRAST TECHNIQUE: Multidetector CT imaging of the chest was performed using the standard protocol during bolus administration of intravenous contrast. Multiplanar CT image reconstructions and MIPs were obtained to evaluate the vascular anatomy. CONTRAST:  90mL OMNIPAQUE IOHEXOL 350 MG/ML SOLN COMPARISON:  Chest x-ray on 04/05/2019 FINDINGS: Cardiovascular: The pulmonary arteries are well opacified. There is no evidence pulmonary embolism. Central pulmonary arteries are normal in caliber. The heart size is normal. The thoracic aorta is normal in caliber. Trace amount of pericardial fluid. Calcified plaque is noted of the coronary arteries primarily in the distribution of the LAD. Mediastinum/Nodes: Mildly prominent right hilar nodal tissue measuring up to 1.5 cm. No evidence of enlarged mediastinal or axillary lymph nodes. Lungs/Pleura: Severe airspace disease is seen throughout all lobes of both lungs in a ground-glass pattern. There is an associated very small right pleural effusion. No  pneumothorax. Upper Abdomen: No acute abnormality. Musculoskeletal: No chest wall abnormality. No acute or significant osseous findings. Review of the MIP images confirms the above findings. IMPRESSION: 1. No evidence of pulmonary embolism. 2. Severe airspace disease throughout all lobes of both lungs in a ground-glass pattern. Findings are consistent with severe bilateral COVID-19 pneumonia. 3. Mildly prominent right hilar nodal tissue measuring up to 1.5 cm in short axis. This is likely reactive. 4. Coronary artery disease with calcified plaque in the distribution of the LAD. Electronically Signed   By: Aletta Edouard M.D.   On: 04/09/2019 08:58   CT ABDOMEN PELVIS W CONTRAST  Result Date: 04/20/2019 CLINICAL DATA:  Acute hypoxic respiratory failure due to COVID pneumonia. Febrile illness. EXAM: CT CHEST, ABDOMEN, AND PELVIS WITH CONTRAST TECHNIQUE: Multidetector CT imaging of the chest, abdomen and pelvis was performed following the standard protocol during bolus administration of intravenous contrast. CONTRAST:  144mL OMNIPAQUE IOHEXOL 350 MG/ML SOLN COMPARISON:  Chest CT 04/09/2019 FINDINGS: CT CHEST FINDINGS Cardiovascular: The heart is mildly enlarged but stable. Stable tortuosity, ectasia and calcification of the thoracic aorta. Stable scattered coronary artery calcifications. Mediastinum/Nodes: Persistent scattered mediastinal and hilar lymph nodes. The esophagus is grossly normal. Lungs/Pleura: Exam limited by breathing motion artifact but there is persistent diffuse interstitial and airspace disease in both lungs. The infiltrates demonstrate progressive consolidation versus the more ground-glass appearance on the prior study. No definite pleural effusions or pleural lesions. No pneumothorax. Musculoskeletal: No significant findings. CT ABDOMEN PELVIS FINDINGS Examination is limited due to breathing motion artifact. Hepatobiliary: No focal hepatic lesions or intrahepatic biliary  dilatation. The  gallbladder is normal. No common bile duct dilatation. Pancreas: Small cystic area noted in the pancreatic head which is likely a benign postinflammatory cyst. There is a second exophytic cyst projecting off the body tail junction region. No findings for acute pancreatitis and no ductal dilatation. Spleen: Normal size. No focal lesions. Adrenals/Urinary Tract: The adrenal glands and kidneys are grossly normal. Small left renal cyst noted. No hydronephrosis. The bladder is unremarkable. Stomach/Bowel: The stomach, duodenum, small bowel and colon are grossly normal. No acute inflammatory changes, mass lesions or obstructive findings. Moderate stool throughout the colon and down into the rectum suggesting constipation Vascular/Lymphatic: Moderate atherosclerotic calcifications involving the aorta and branch vessels but no aneurysm. No mesenteric or retroperitoneal mass or adenopathy. Reproductive: Mild prostate gland enlargement. The seminal vesicles appear normal. Other: No pelvic mass or free pelvic fluid collections. No inguinal mass or adenopathy. Musculoskeletal: No significant bony findings. IMPRESSION: 1. Persistent diffuse interstitial and airspace disease in both lungs. The infiltrates demonstrate progressive consolidation versus the more ground-glass appearance on the prior study. 2. Stable mediastinal and hilar lymph nodes. 3. No acute abdominal/pelvic findings, mass lesions or adenopathy. 4. Cystic lesions in the pancreas, likely benign postinflammatory cysts but will need follow-up once the patient is no longer acutely ill. MRI of the abdomen would be helpful for further evaluation if possible at some point. 5. Moderate stool throughout the colon and down into the rectum suggesting constipation. Electronically Signed   By: Marijo Sanes M.D.   On: 04/20/2019 14:30   DG Chest Port 1 View  Result Date: 04/16/2019 CLINICAL DATA:  74 year old male with a history of COVID pneumonia EXAM: PORTABLE CHEST 1  VIEW COMPARISON:  CT 04/09/2019, plain film 04/05/2019 FINDINGS: Cardiomediastinal silhouette unchanged in size and contour. Similar appearance of low lung volumes and mixed airspace and interstitial opacities of the bilateral lungs. No pneumothorax or pleural effusion. IMPRESSION: Unchanged appearance of the lungs with mixed interstitial and airspace disease bilateral compatible with multifocal pneumonia Electronically Signed   By: Corrie Mckusick D.O.   On: 04/16/2019 07:48   DG Chest Port 1 View  Result Date: 04/05/2019 CLINICAL DATA:  COVID positive low oxygen level EXAM: PORTABLE CHEST 1 VIEW COMPARISON:  None. FINDINGS: Bilateral ground-glass opacities and consolidations. No pleural effusion. Normal heart size. Aortic atherosclerosis. No pneumothorax. IMPRESSION: Bilateral ground-glass opacities and consolidations consistent with bilateral pneumonia. Electronically Signed   By: Donavan Foil M.D.   On: 04/05/2019 16:47   VAS [place] LOWER EXTREMITY VENOUS (DVT)  Result Date: 04/09/2019  Lower Venous Study Indications: Covid positive with worsening positive d-Dimer and hypoxia.  Anticoagulation: Lovenox. Comparison Study: No priors. Performing Technologist: Oda Cogan RDMS, RVT  Examination Guidelines: A complete evaluation includes B-mode imaging, spectral Doppler, color Doppler, and power Doppler as needed of all accessible portions of each vessel. Bilateral testing is considered an integral part of a complete examination. Limited examinations for reoccurring indications may be performed as noted.  +---------+---------------+---------+-----------+----------+--------------+ RIGHT    CompressibilityPhasicitySpontaneityPropertiesThrombus Aging +---------+---------------+---------+-----------+----------+--------------+ CFV      Full           Yes      Yes                                 +---------+---------------+---------+-----------+----------+--------------+ SFJ      Full                                                         +---------+---------------+---------+-----------+----------+--------------+  FV Prox  Full                                                        +---------+---------------+---------+-----------+----------+--------------+ FV Mid   Full                                                        +---------+---------------+---------+-----------+----------+--------------+ FV DistalFull                                                        +---------+---------------+---------+-----------+----------+--------------+ PFV      Full                                                        +---------+---------------+---------+-----------+----------+--------------+ POP      Full           Yes      Yes                                 +---------+---------------+---------+-----------+----------+--------------+ PTV      Full                                                        +---------+---------------+---------+-----------+----------+--------------+ PERO     Full                                                        +---------+---------------+---------+-----------+----------+--------------+   +---------+---------------+---------+-----------+----------+--------------+ LEFT     CompressibilityPhasicitySpontaneityPropertiesThrombus Aging +---------+---------------+---------+-----------+----------+--------------+ CFV      Full           Yes      Yes                                 +---------+---------------+---------+-----------+----------+--------------+ SFJ      Full                                                        +---------+---------------+---------+-----------+----------+--------------+ FV Prox  Full                                                        +---------+---------------+---------+-----------+----------+--------------+  FV Mid   Full                                                         +---------+---------------+---------+-----------+----------+--------------+ FV DistalFull                                                        +---------+---------------+---------+-----------+----------+--------------+ PFV      Full                                                        +---------+---------------+---------+-----------+----------+--------------+ POP      Full           Yes      Yes                                 +---------+---------------+---------+-----------+----------+--------------+ PTV      Full                                                        +---------+---------------+---------+-----------+----------+--------------+ PERO     Partial                                      Acute          +---------+---------------+---------+-----------+----------+--------------+ Soleal   Full                                         Acute          +---------+---------------+---------+-----------+----------+--------------+     Summary: Right: There is no evidence of deep vein thrombosis in the lower extremity. No cystic structure found in the popliteal fossa. Left: Findings consistent with acute deep vein thrombosis involving the left peroneal veins, and left soleal veins. Short segment of localized thrombus in the peroneal and soleal veins in the mid segment.  *See table(s) above for measurements and observations. Electronically signed by Ruta Hinds MD on 04/09/2019 at 2:56:25 PM.    Final

## 2019-04-24 LAB — CBC WITH DIFFERENTIAL/PLATELET
Abs Immature Granulocytes: 0.09 10*3/uL — ABNORMAL HIGH (ref 0.00–0.07)
Basophils Absolute: 0.1 10*3/uL (ref 0.0–0.1)
Basophils Relative: 1 %
Eosinophils Absolute: 0.6 10*3/uL — ABNORMAL HIGH (ref 0.0–0.5)
Eosinophils Relative: 7 %
HCT: 40.1 % (ref 39.0–52.0)
Hemoglobin: 13.3 g/dL (ref 13.0–17.0)
Immature Granulocytes: 1 %
Lymphocytes Relative: 12 %
Lymphs Abs: 1 10*3/uL (ref 0.7–4.0)
MCH: 30.3 pg (ref 26.0–34.0)
MCHC: 33.2 g/dL (ref 30.0–36.0)
MCV: 91.3 fL (ref 80.0–100.0)
Monocytes Absolute: 1.1 10*3/uL — ABNORMAL HIGH (ref 0.1–1.0)
Monocytes Relative: 14 %
Neutro Abs: 5.6 10*3/uL (ref 1.7–7.7)
Neutrophils Relative %: 65 %
Platelets: 211 10*3/uL (ref 150–400)
RBC: 4.39 MIL/uL (ref 4.22–5.81)
RDW: 14.4 % (ref 11.5–15.5)
WBC: 8.4 10*3/uL (ref 4.0–10.5)
nRBC: 0 % (ref 0.0–0.2)

## 2019-04-24 LAB — COMPREHENSIVE METABOLIC PANEL
ALT: 16 U/L (ref 0–44)
AST: 19 U/L (ref 15–41)
Albumin: 3.1 g/dL — ABNORMAL LOW (ref 3.5–5.0)
Alkaline Phosphatase: 57 U/L (ref 38–126)
Anion gap: 8 (ref 5–15)
BUN: 25 mg/dL — ABNORMAL HIGH (ref 8–23)
CO2: 27 mmol/L (ref 22–32)
Calcium: 8.3 mg/dL — ABNORMAL LOW (ref 8.9–10.3)
Chloride: 101 mmol/L (ref 98–111)
Creatinine, Ser: 1.12 mg/dL (ref 0.61–1.24)
GFR calc Af Amer: >60 mL/min (ref >60)
GFR calc non Af Amer: >60 mL/min (ref >60)
Glucose, Bld: 134 mg/dL — ABNORMAL HIGH (ref 70–99)
Potassium: 3.8 mmol/L (ref 3.5–5.1)
Sodium: 136 mmol/L (ref 135–145)
Total Bilirubin: 1.1 mg/dL (ref 0.3–1.2)
Total Protein: 5.7 g/dL — ABNORMAL LOW (ref 6.5–8.1)

## 2019-04-24 LAB — HISTOPLASMA GAL'MANNAN AG SER: Histoplasma Gal'mannan Ag Ser: <0.5 (ref <0.5)

## 2019-04-24 LAB — ASPERGILLUS ANTIBODY BY IMMUNODIFF
Aspergillus flavus: NEGATIVE
Aspergillus fumigatus, IgG: NEGATIVE
Aspergillus niger: NEGATIVE

## 2019-04-24 LAB — AEROBIC/ANAEROBIC CULTURE W GRAM STAIN (SURGICAL/DEEP WOUND)
Culture: NO GROWTH
Gram Stain: NONE SEEN

## 2019-04-24 LAB — MAGNESIUM: Magnesium: 2.1 mg/dL (ref 1.7–2.4)

## 2019-04-24 LAB — BRAIN NATRIURETIC PEPTIDE: B Natriuretic Peptide: 40.2 pg/mL (ref 0.0–100.0)

## 2019-04-24 LAB — FUNGITELL, SERUM: Fungitell Result: <31 pg/mL (ref <80)

## 2019-04-24 LAB — GLUCOSE, CAPILLARY
Glucose-Capillary: 152 mg/dL — ABNORMAL HIGH (ref 70–99)
Glucose-Capillary: 160 mg/dL — ABNORMAL HIGH (ref 70–99)
Glucose-Capillary: 210 mg/dL — ABNORMAL HIGH (ref 70–99)
Glucose-Capillary: 151 mg/dL — ABNORMAL HIGH (ref 70–99)

## 2019-04-24 LAB — D-DIMER, QUANTITATIVE: D-Dimer, Quant: 2.86 ug/mL-FEU — ABNORMAL HIGH (ref 0.00–0.50)

## 2019-04-24 LAB — C-REACTIVE PROTEIN: CRP: 0.8 mg/dL (ref <1.0)

## 2019-04-24 MED ORDER — FUROSEMIDE 10 MG/ML IJ SOLN
20.0000 mg | Freq: Once | INTRAMUSCULAR | Status: AC
  Administered 2019-04-24: 20 mg via INTRAVENOUS
  Filled 2019-04-24: qty 2

## 2019-04-24 NOTE — Plan of Care (Signed)
  Problem: Education: Goal: Knowledge of risk factors and measures for prevention of condition will improve Outcome: Progressing   Problem: Respiratory: Goal: Will maintain a patent airway Outcome: Progressing   Problem: Clinical Measurements: Goal: Ability to maintain clinical measurements within normal limits will improve Outcome: Progressing   Problem: Clinical Measurements: Goal: Will remain free from infection Outcome: Progressing   Problem: Elimination: Goal: Will not experience complications related to urinary retention Outcome: Progressing   Problem: Safety: Goal: Ability to remain free from injury will improve Outcome: Progressing   Problem: Skin Integrity: Goal: Risk for impaired skin integrity will decrease Outcome: Progressing

## 2019-04-24 NOTE — Progress Notes (Signed)
Physical Therapy Treatment Patient Details Name: Dustin Diaz MRN: [id number] DOB: [birth year] Today's Date: 04/24/2019    History of Present Illness 74 year old male admitted with Covid /Pneumonia. PMH includes DM, HTN, hyperlipidemia, polymyalgia rheumatica. 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 Patient requires extra time to mobilize. Patient on 4 L [place](Ear) at 96% then 91 %  With mobility. BP 124/61. Continue progressive  Mobility as tolerated.    Follow Up Recommendations  Home health PT;Supervision - Intermittent     Equipment Recommendations  Rolling walker with 5" wheels    Recommendations for Other Services       Precautions / Restrictions Precautions Precautions: Fall;Other (comment) Precaution Comments: monitor O2 Sats, and BP    Mobility  Bed Mobility Overal bed mobility: Modified Independent             General bed mobility comments: moved quickly , frustrated and anxious. Increased SOB.  Transfers                 General transfer comment: NT  Ambulation/Gait                 Stairs             Wheelchair Mobility    Modified Rankin (Stroke Patients Only)       Balance                                            Cognition Arousal/Alertness: Awake/alert Behavior During Therapy: Flat affect;Anxious Overall Cognitive Status: Within Functional Limits for tasks assessed                                 General Comments: anxious when he tried to sit up and felt dizzy, did not try to stand and quickly got self back to bed.      Exercises      General Comments        Pertinent Vitals/Pain Faces Pain Scale: Hurts even more Pain Location: mouth. lips, neck sores Pain Descriptors / Indicators: Burning;Constant;Discomfort Pain Intervention(s): Monitored during session    Home Living                       Prior Function            PT Goals (current goals can now be found in the care plan section) Acute Rehab PT Goals Patient Stated Goal: Go back home PT Goal Formulation: With patient Time For Goal Achievement: 05/08/19 Potential to Achieve Goals: Fair Progress towards PT goals: Not progressing toward goals - comment(weakness, SOB,)    Frequency    Min 3X/week      PT Plan Current plan remains appropriate    Co-evaluation              AM-PAC PT "6 Clicks" Mobility   Outcome Measure  Help needed turning from your back to your side while in a flat bed without using bedrails?: None Help needed moving from lying on your back to sitting on the side of a flat bed without using bedrails?: None Help needed moving to and from a bed to a chair (including  a wheelchair)?: A Little Help needed standing up from a chair using your arms (e.g., wheelchair or bedside chair)?: A Little Help needed to walk in hospital room?: A Lot Help needed climbing 3-5 steps with a railing? : A Lot 6 Click Score: 18    End of Session Equipment Utilized During Treatment: Oxygen Activity Tolerance: Patient limited by fatigue;Treatment limited secondary to medical complications (Comment) Patient left: in bed;with call bell/phone within reach Nurse Communication: Mobility status PT Visit Diagnosis: Unsteadiness on feet (R26.81);Other abnormalities of gait and mobility (R26.89);Muscle weakness (generalized) (M62.81)     Time: [id number] PT Time Calculation (min) (ACUTE ONLY): 43 min  Charges:  $Therapeutic Activity: 38-52 mins                     Tresa Endo PT Acute Rehabilitation Services Pager [phone] Office [phone]    Claretha Cooper 04/24/2019, 5:54 PM

## 2019-04-24 NOTE — Progress Notes (Addendum)
PROGRESS NOTE                                                                                                                                                                                                             Patient Demographics:    Dustin Diaz, is a 74 y.o. male, DOB - [birth year], [id number]  Outpatient Primary MD for the patient is Lavone Orn, MD   Admit date - 04/05/2019   LOS - 13  Chief Complaint  Patient presents with  . Covid positive  . low O2       Brief Narrative:  Patient is a 74 y.o. 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  Hospital course complicated by slow improvement in hypoxia,lower extremity DVT, and nodular ulcerated skin lesions raising concern for invasive fungal infection.  See below for further details.   Subjective:   Patient in bed denies any headache, no chest or abdominal pain, his skin lesions are less painful, mild shortness of breath upon laying flat.   Assessment  & Plan :   Acute Hypoxic Resp Failure due to Covid 19 Viral pneumonia: Slowly improved-down to just 2 L at rest this morning-but gets very dyspneic with minimal movement.  Has required around 10-15 L of oxygen with ambulation-in the past few sessions with physical therapy.  Has completed a course of remdesivir-and has been slowly weaned back to his usual home regimen of prednisone.  O2 requirements:  SpO2: 93 % O2 Flow Rate (L/min): 3 L/min FiO2 (%): 100 %   COVID-19 Labs: Recent Labs    04/22/19 0155 04/23/19 0348 04/24/19 0535  DDIMER 3.19* 2.97* 2.86*  CRP 0.9 0.6 0.8       Component Value Date/Time   BNP 40.2 04/24/2019 0535    No results for input(s): PROCALCITON in the last 168 hours.  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  Case discussed with pathology.  Plan is to continue IV acyclovir for another 1 to 2 days once all the lesions are crusted well he can be transitioned to oral valacyclovir to complete a total of 2 weeks of antiviral treatment.   Left lower extremity DVT: Continue Eliquis-CTA chest on 1/18 without PE.  DM-2: CBG stable-continue Lantus 6 units and NovoLog.  Follow.  CBG (last 3)  Recent Labs    04/23/19 1733 04/23/19 2130 04/24/19 0756  GLUCAP 114* 140* 152*    HTN: BP controlled-have discontinued all antihypertensives on 1/26-due to dizziness upon standing.  Dyslipidemia: Continue statin  Bibasilar Rales on exam suggestive of fluid overload.  Does have mild orthopnea on 04/24/2019, repeat low-dose IV Lasix and monitor.Marland Kitchen  History of PMR: Continue prednisone-dose gradually taper down back to his usual home regimen of 3 mg.  Right hilar lymphadenopathy: Likely reactive-repeat imaging in 3 to 6 months as outpatient.  ? Pancreatic Cyst on CT - PCP guided follow up.  Recommend outpatient MRI abdomen pelvis with outpatient GI follow-up in 3 to 4 weeks post discharge.  Nutrition Problem: Nutrition Problem: Increased nutrient needs Etiology: catabolic [id number] PNA) Signs/Symptoms: estimated needs Interventions: Hormel Shake    Consults  :  CCS, ID  Procedures  :   1/28>> Punch biopsy right neck lesion by general surgery - results consistent with HSV I, discussed with pathologist.   Condition - Fair  Family Communication  :  Previous MD  -  Wife updated 04/22/2019, updated again on 04/23/2019.  Code Status :   Full Code  Diet :  Diet Order            Diet heart healthy/carb modified Room service appropriate? Yes; Fluid consistency: Thin  Diet effective now               Disposition Plan  :  Remain hospitalized-Home when hypoxia improves.  Barriers to discharge: Hypoxia requiring O2 supplementation/remains on IV amphotericin B (see assessment/plan section for further details)  Antimicorbials  :    Anti-infectives (From admission, onward)   Start     Dose/Rate Route Frequency Ordered Stop   04/23/19 1430  acyclovir (ZOVIRAX) 390 mg in dextrose 5 % 100 mL IVPB     5 mg/kg  78 kg 107.8 mL/hr over 60 Minutes Intravenous Every 8 hours 04/23/19 1347     04/18/19 1800  amphotericin B liposome (AMBISOME) 230 mg in dextrose 5 % 500 mL IVPB  Status:  Discontinued     3 mg/kg  78 kg 278.8 mL/hr over 120 Minutes Intravenous Every 24 hours 04/18/19 1447 04/23/19 1347   04/18/19 1000  vancomycin (VANCOCIN) IVPB 1000 mg/200 mL premix  Status:  Discontinued     1,000 mg 200 mL/hr over 60 Minutes Intravenous Every 12 hours 04/17/19 1400 04/18/19 1138   04/17/19 1500  ceFEPIme (MAXIPIME) 2 g in sodium chloride 0.9 % 100 mL IVPB  Status:  Discontinued     2 g 200 mL/hr over 30 Minutes Intravenous Every 8 hours 04/17/19 1359 04/18/19 1335   04/17/19 1430  vancomycin (VANCOREADY) IVPB 1500 mg/300 mL     1,500 mg 150 mL/hr over 120 Minutes Intravenous  Once 04/17/19 1400 04/17/19 1825   04/06/19 1000  remdesivir 100 mg in sodium chloride 0.9 % 100 mL IVPB  Status:  Discontinued     100 mg 200 mL/hr over 30 Minutes Intravenous Daily 04/05/19 1919 04/05/19 1919   04/06/19  1000  remdesivir 100 mg in sodium chloride 0.9 % 100 mL IVPB     100 mg 200 mL/hr over 30 Minutes Intravenous Daily 04/05/19 1919 04/09/19 0948   04/06/19 0400  azithromycin (ZITHROMAX) 500 mg in sodium chloride 0.9 % 250 mL IVPB  Status:  Discontinued     500 mg 250 mL/hr over 60 Minutes Intravenous Every 24 hours 04/06/19 0242  04/06/19 0725   04/06/19 0300  cefTRIAXone (ROCEPHIN) 1 g in sodium chloride 0.9 % 100 mL IVPB  Status:  Discontinued     1 g 200 mL/hr over 30 Minutes Intravenous Every 24 hours 04/06/19 0242 04/06/19 0725   04/05/19 2100  remdesivir 200 mg in sodium chloride 0.9% 250 mL IVPB     200 mg 580 mL/hr over 30 Minutes Intravenous Once 04/05/19 1919 04/05/19 2200   04/05/19 1918  remdesivir 200 mg in sodium chloride 0.9% 250 mL IVPB  Status:  Discontinued     200 mg 580 mL/hr over 30 Minutes Intravenous Once 04/05/19 1919 04/05/19 1919     DVT Prophylaxis  : Eliquis  Inpatient Medications  Scheduled Meds: . acyclovir ointment   Topical 4x daily  . apixaban  5 mg Oral BID  . vitamin C  500 mg Oral Daily  . atorvastatin  10 mg Oral Daily  . docusate sodium  200 mg Oral BID  . fluticasone  2 spray Each Nare Daily  . furosemide  20 mg Intravenous Once  . insulin aspart  0-15 Units Subcutaneous TID WC  . insulin aspart  0-5 Units Subcutaneous QHS  . insulin aspart  4 Units Subcutaneous TID WC  . insulin glargine  6 Units Subcutaneous QHS  . Ipratropium-Albuterol  1 puff Inhalation Q6H  . latanoprost  1 drop Both Eyes QHS  . pantoprazole  40 mg Oral Daily  . predniSONE  3 mg Oral Daily  . zinc sulfate  220 mg Oral Daily   Continuous Infusions: . acyclovir 390 mg (04/24/19 0540)  . sodium chloride Stopped (04/23/19 2220)  . sodium chloride Stopped (04/23/19 2121)   PRN Meds:.acetaminophen, ALPRAZolam, chlorpheniramine-HYDROcodone, diphenhydrAMINE **OR** diphenhydrAMINE, guaiFENesin-dextromethorphan, lip balm, magic mouthwash w/lidocaine, [DISCONTINUED] ondansetron **OR** ondansetron (ZOFRAN) IV, sodium chloride, sodium chloride flush, traMADol   Time Spent in minutes  25  See all Orders from today for further details   Lala Lund M.D on 04/24/2019 at 9:08 AM  To page go to [email or web address] - use universal password  [facility] -  Office  [phone]    Objective:    Vitals:   04/23/19 1700 04/23/19 2000 04/24/19 0456 04/24/19 0753  BP: (!) 141/66 135/73 140/82 129/74  Pulse: 87 83 86 78  Resp: 20 20 18 18   Temp: (!) 97.5 F (36.4 C) 97.7 F (36.5 C) 98 F (36.7 C) 98 F (36.7 C)  TempSrc: Oral Oral Oral Oral  SpO2: 90% 91% 93% 93%  Weight:      Height:        Wt Readings from Last 3 Encounters:  04/06/19 78 kg  07/24/12 93 kg     Intake/Output Summary (Last 24 hours) at 04/24/2019 0908 Last data filed at 04/24/2019 0830 Gross per 24 hour  Intake 1046.22 ml  Output 1925 ml  Net -878.78 ml    Physical Exam  Awake Alert,  No new F.N deficits, Normal affect [place].AT,PERRAL Supple Neck,No JVD, No cervical lymphadenopathy appriciated.  Symmetrical Chest wall movement, Good air movement bilaterally, few rales RRR,No Gallops, Rubs or new Murmurs,  No Parasternal Heave +ve B.Sounds, Abd Soft, No tenderness, No organomegaly appriciated, No rebound - guarding or rigidity.  Patient has multiple nodular lesions one on his lower lip, 2 on the right lateral aspect of his neck, 1 on his occipital area and 2 putting lesions on the back of his neck, pictures below, they are now drying and look almost completely crusted.  Skin: See pictures below (taken on 1/27)                      Data Review:    CBC Recent Labs  Lab 04/19/19 0135 04/20/19 0200 04/22/19 0155 04/23/19 0348 04/24/19 0535  WBC 13.6* 10.9* 10.7* 10.3 8.4  HGB 14.0 15.0 13.9 13.9 13.3  HCT 41.6 47.4 41.6 41.6 40.1  PLT 227 293 241 220 211  MCV 90.8 97.1 90.6 91.6 91.3  MCH 30.6 30.7 30.3 30.6 30.3  MCHC 33.7 31.6 33.4 33.4 33.2  RDW 14.1 14.7 14.1 14.4 14.4  LYMPHSABS  --   --  1.0 1.2 1.0  MONOABS  --   --  1.5* 1.4* 1.1*  EOSABS  --   --  0.4 0.5 0.6*  BASOSABS  --   --  0.1 0.1 0.1    Chemistries  Recent Labs  Lab 04/19/19 0135 04/19/19 0135 04/20/19 0200 04/21/19 0202 04/22/19 0155 04/23/19 0348 04/24/19 0535  NA 133*   < > 134* 135 135  134* 136  K 4.0   < > 4.4 3.9 3.9 4.5 3.8  CL 102   < > 103 101 99 99 101  CO2 23   < > 23 24 27 27 27   GLUCOSE 116*   < > 112* 114* 119* 115* 134*  BUN 24*   < > 23 23 23  29* 25*  CREATININE 0.70   < > 0.67 0.67 0.86 1.16 1.12  CALCIUM 8.3*   < > 8.3* 8.6* 8.4* 8.5* 8.3*  MG 2.1   < > 2.1 2.1 2.3 2.3 2.1  AST 19  --  24  --  20 18 19   ALT 18  --  18  --  19 18 16   ALKPHOS 71  --  72  --  68 65 57  BILITOT 1.7*  --  1.2  --  1.2 1.6* 1.1   < > = values in this interval not displayed.   ------------------------------------------------------------------------------------------------------------------ No results for input(s): CHOL, HDL, LDLCALC, TRIG, CHOLHDL, LDLDIRECT in the last 72 hours.  Lab Results  Component Value Date   HGBA1C 7.1 (H) 04/06/2019   ------------------------------------------------------------------------------------------------------------------ No results for input(s): TSH, T4TOTAL, T3FREE, THYROIDAB in the last 72 hours.  Invalid input(s): FREET3 ------------------------------------------------------------------------------------------------------------------ No results for input(s): VITAMINB12, FOLATE, FERRITIN, TIBC, IRON, RETICCTPCT in the last 72 hours.  Coagulation profile No results for input(s): INR, PROTIME in the last 168 hours.  Recent Labs    04/23/19 0348 04/24/19 0535  DDIMER 2.97* 2.86*    Cardiac Enzymes No results for input(s): CKMB, TROPONINI, MYOGLOBIN in the last 168 hours.  Invalid input(s): CK ------------------------------------------------------------------------------------------------------------------    Component Value Date/Time   BNP 40.2 04/24/2019 0535    Micro Results Recent Results (from the past 240 hour(s))  MRSA PCR Screening     Status: None   Collection Time: 04/17/19  4:17 PM   Specimen: Nasal Mucosa; Nasopharyngeal  Result Value Ref Range Status   MRSA by PCR NEGATIVE NEGATIVE Final    Comment:  The GeneXpert MRSA Assay (FDA approved for NASAL specimens only), is one component of a comprehensive MRSA colonization surveillance program. It is not intended to diagnose MRSA infection nor to guide or monitor treatment for MRSA infections. Performed at [facility], [place] [address], [place], [place] [address]   Fungus culture, blood     Status: None (Preliminary result)   Collection Time: 04/18/19  3:00 PM   Specimen: BLOOD  Result Value Ref Range Status   Specimen Description BLOOD RIGHT ANTECUBITAL  Final   Special Requests   Final    BOTTLES DRAWN AEROBIC ONLY Blood Culture adequate volume Performed at [facility] [address], [place], [place] [address]    Culture NO GROWTH 6 DAYS  Final   Report Status PENDING  Incomplete  Fungus culture, blood     Status: None (Preliminary result)   Collection Time: 04/18/19  3:05 PM   Specimen: BLOOD RIGHT HAND  Result Value Ref Range Status   Specimen Description BLOOD RIGHT HAND  Final   Special Requests   Final    BOTTLES DRAWN AEROBIC ONLY Blood Culture adequate volume Performed at [facility], [place] [address] [place], [place] [address]    Culture NO GROWTH 6 DAYS  Final   Report Status PENDING  Incomplete  Aerobic/Anaerobic Culture (surgical/deep wound)     Status: None (Preliminary result)   Collection Time: 04/19/19 11:39 AM   Specimen: Biopsy; Tissue  Result Value Ref Range Status   Specimen Description   Final    BIOPSY RT NECK Performed at [facility] [address], [place], [place] [address]    Special Requests   Final    Immunocompromised Performed at [facility], [place] [address], [place], [place] [address]    Gram Stain NO WBC SEEN NO ORGANISMS SEEN   Final   Culture   Final    NO GROWTH 4 DAYS NO ANAEROBES ISOLATED; CULTURE IN PROGRESS FOR 5 DAYS Performed at [facility] [address],  [place], [place] [address]    Report Status PENDING  Incomplete  Acid Fast Smear (AFB)     Status: None   Collection Time: 04/19/19 11:39 AM   Specimen: Neck, Right  Result Value Ref Range Status   AFB Specimen Processing Concentration  Final   Acid Fast Smear Negative  Final    Comment: (NOTE) Performed At: [facility] [place], [place] [id number] Rush Farmer MD [id number]    Source (AFB) BIOPSY  Final    Comment: RT NECK Performed at [facility], [place] [address], [place], [place] [address]   Fungus Culture With Stain     Status: None (Preliminary result)   Collection Time: 04/19/19 11:39 AM   Specimen: Neck, Right  Result Value Ref Range Status   Fungus Stain Final report  Final    Comment: (NOTE) Performed At: [facility] [place], [place] [id number] Rush Farmer MD [id number]    Fungus (Mycology) Culture PENDING  Incomplete   Fungal Source BIOPSY  Final    Comment: RT NECK Performed at [facility], [place] [address] [place], [place] [address]   Fungus Culture Result     Status: None   Collection Time: 04/19/19 11:39 AM  Result Value Ref Range Status   Result 1 Comment  Final    Comment: (NOTE) KOH/Calcofluor preparation:  no fungus observed. Performed At: [facility]  [place], [place] [id number] Rush Farmer MD [id number]     Radiology Reports CT HEAD W & WO CONTRAST  Result Date: 04/20/2019 CLINICAL DATA:  Suspected sinusitis EXAM: CT HEAD WITHOUT AND WITH CONTRAST TECHNIQUE: Contiguous axial images were obtained from the base of the skull through the vertex without and with intravenous contrast CONTRAST:  177mL OMNIPAQUE IOHEXOL 350 MG/ML SOLN COMPARISON:  None. FINDINGS: Brain: There is no acute intracranial hemorrhage, mass-effect, or edema. Gray-white differentiation is preserved. Patchy hypoattenuation in the supratentorial white matter is  nonspecific but may reflect mild chronic microvascular ischemic changes. There is no extra-axial fluid collection. Ventricles and sulci are within normal limits in size and configuration. Vascular: There is atherosclerotic calcification at the skull base. Skull: Calvarium is unremarkable. Sinuses/Orbits: Minimal patchy retained secretions left ethmoid sinuses. Otherwise trace paranasal sinus mucosal thickening. There is evidence chronic left maxillary sinus inflammation. Orbits are unremarkable. Other: None. IMPRESSION: No acute intracranial abnormality. Mild chronic microvascular ischemic changes. No significant paranasal sinus opacification. Electronically Signed   By: Macy Mis M.D.   On: 04/20/2019 14:32   CT CHEST W CONTRAST  Result Date: 04/20/2019 CLINICAL DATA:  Acute hypoxic respiratory failure due to COVID pneumonia. Febrile illness. EXAM: CT CHEST, ABDOMEN, AND PELVIS WITH CONTRAST TECHNIQUE: Multidetector CT imaging of the chest, abdomen and pelvis was performed following the standard protocol during bolus administration of intravenous contrast. CONTRAST:  16mL OMNIPAQUE IOHEXOL 350 MG/ML SOLN COMPARISON:  Chest CT 04/09/2019 FINDINGS: CT CHEST FINDINGS Cardiovascular: The heart is mildly enlarged but stable. Stable tortuosity, ectasia and calcification of the thoracic aorta. Stable scattered coronary artery calcifications. Mediastinum/Nodes: Persistent scattered mediastinal and hilar lymph nodes. The esophagus is grossly normal. Lungs/Pleura: Exam limited by breathing motion artifact but there is persistent diffuse interstitial and airspace disease in both lungs. The infiltrates demonstrate progressive consolidation versus the more ground-glass appearance on the prior study. No definite pleural effusions or pleural lesions. No pneumothorax. Musculoskeletal: No significant findings. CT ABDOMEN PELVIS FINDINGS Examination is limited due to breathing motion artifact. Hepatobiliary: No focal  hepatic lesions or intrahepatic biliary dilatation. The gallbladder is normal. No common bile duct dilatation. Pancreas: Small cystic area noted in the pancreatic head which is likely a benign postinflammatory cyst. There is a second exophytic cyst projecting off the body tail junction region. No findings for acute pancreatitis and no ductal dilatation. Spleen: Normal size. No focal lesions. Adrenals/Urinary Tract: The adrenal glands and kidneys are grossly normal. Small left renal cyst noted. No hydronephrosis. The bladder is unremarkable. Stomach/Bowel: The stomach, duodenum, small bowel and colon are grossly normal. No acute inflammatory changes, mass lesions or obstructive findings. Moderate stool throughout the colon and down into the rectum suggesting constipation Vascular/Lymphatic: Moderate atherosclerotic calcifications involving the aorta and branch vessels but no aneurysm. No mesenteric or retroperitoneal mass or adenopathy. Reproductive: Mild prostate gland enlargement. The seminal vesicles appear normal. Other: No pelvic mass or free pelvic fluid collections. No inguinal mass or adenopathy. Musculoskeletal: No significant bony findings. IMPRESSION: 1. Persistent diffuse interstitial and airspace disease in both lungs. The infiltrates demonstrate progressive consolidation versus the more ground-glass appearance on the prior study. 2. Stable mediastinal and hilar lymph nodes. 3. No acute abdominal/pelvic findings, mass lesions or adenopathy. 4. Cystic lesions in the pancreas, likely benign postinflammatory cysts but will need follow-up once the patient is no longer acutely ill. MRI of the abdomen would be helpful for further evaluation if possible at some point. 5. Moderate stool throughout the  colon and down into the rectum suggesting constipation. Electronically Signed   By: Marijo Sanes M.D.   On: 04/20/2019 14:30   CT ANGIO CHEST PE W OR WO CONTRAST  Result Date: 04/09/2019 CLINICAL DATA:  COVID  pneumonia, hypoxia, shortness of breath and elevated D-dimer. EXAM: CT ANGIOGRAPHY CHEST WITH CONTRAST TECHNIQUE: Multidetector CT imaging of the chest was performed using the standard protocol during bolus administration of intravenous contrast. Multiplanar CT image reconstructions and MIPs were obtained to evaluate the vascular anatomy. CONTRAST:  88mL OMNIPAQUE IOHEXOL 350 MG/ML SOLN COMPARISON:  Chest x-ray on 04/05/2019 FINDINGS: Cardiovascular: The pulmonary arteries are well opacified. There is no evidence pulmonary embolism. Central pulmonary arteries are normal in caliber. The heart size is normal. The thoracic aorta is normal in caliber. Trace amount of pericardial fluid. Calcified plaque is noted of the coronary arteries primarily in the distribution of the LAD. Mediastinum/Nodes: Mildly prominent right hilar nodal tissue measuring up to 1.5 cm. No evidence of enlarged mediastinal or axillary lymph nodes. Lungs/Pleura: Severe airspace disease is seen throughout all lobes of both lungs in a ground-glass pattern. There is an associated very small right pleural effusion. No pneumothorax. Upper Abdomen: No acute abnormality. Musculoskeletal: No chest wall abnormality. No acute or significant osseous findings. Review of the MIP images confirms the above findings. IMPRESSION: 1. No evidence of pulmonary embolism. 2. Severe airspace disease throughout all lobes of both lungs in a ground-glass pattern. Findings are consistent with severe bilateral COVID-19 pneumonia. 3. Mildly prominent right hilar nodal tissue measuring up to 1.5 cm in short axis. This is likely reactive. 4. Coronary artery disease with calcified plaque in the distribution of the LAD. Electronically Signed   By: Aletta Edouard M.D.   On: 04/09/2019 08:58   CT ABDOMEN PELVIS W CONTRAST  Result Date: 04/20/2019 CLINICAL DATA:  Acute hypoxic respiratory failure due to COVID pneumonia. Febrile illness. EXAM: CT CHEST, ABDOMEN, AND PELVIS WITH  CONTRAST TECHNIQUE: Multidetector CT imaging of the chest, abdomen and pelvis was performed following the standard protocol during bolus administration of intravenous contrast. CONTRAST:  166mL OMNIPAQUE IOHEXOL 350 MG/ML SOLN COMPARISON:  Chest CT 04/09/2019 FINDINGS: CT CHEST FINDINGS Cardiovascular: The heart is mildly enlarged but stable. Stable tortuosity, ectasia and calcification of the thoracic aorta. Stable scattered coronary artery calcifications. Mediastinum/Nodes: Persistent scattered mediastinal and hilar lymph nodes. The esophagus is grossly normal. Lungs/Pleura: Exam limited by breathing motion artifact but there is persistent diffuse interstitial and airspace disease in both lungs. The infiltrates demonstrate progressive consolidation versus the more ground-glass appearance on the prior study. No definite pleural effusions or pleural lesions. No pneumothorax. Musculoskeletal: No significant findings. CT ABDOMEN PELVIS FINDINGS Examination is limited due to breathing motion artifact. Hepatobiliary: No focal hepatic lesions or intrahepatic biliary dilatation. The gallbladder is normal. No common bile duct dilatation. Pancreas: Small cystic area noted in the pancreatic head which is likely a benign postinflammatory cyst. There is a second exophytic cyst projecting off the body tail junction region. No findings for acute pancreatitis and no ductal dilatation. Spleen: Normal size. No focal lesions. Adrenals/Urinary Tract: The adrenal glands and kidneys are grossly normal. Small left renal cyst noted. No hydronephrosis. The bladder is unremarkable. Stomach/Bowel: The stomach, duodenum, small bowel and colon are grossly normal. No acute inflammatory changes, mass lesions or obstructive findings. Moderate stool throughout the colon and down into the rectum suggesting constipation Vascular/Lymphatic: Moderate atherosclerotic calcifications involving the aorta and branch vessels but no aneurysm. No mesenteric  or retroperitoneal  mass or adenopathy. Reproductive: Mild prostate gland enlargement. The seminal vesicles appear normal. Other: No pelvic mass or free pelvic fluid collections. No inguinal mass or adenopathy. Musculoskeletal: No significant bony findings. IMPRESSION: 1. Persistent diffuse interstitial and airspace disease in both lungs. The infiltrates demonstrate progressive consolidation versus the more ground-glass appearance on the prior study. 2. Stable mediastinal and hilar lymph nodes. 3. No acute abdominal/pelvic findings, mass lesions or adenopathy. 4. Cystic lesions in the pancreas, likely benign postinflammatory cysts but will need follow-up once the patient is no longer acutely ill. MRI of the abdomen would be helpful for further evaluation if possible at some point. 5. Moderate stool throughout the colon and down into the rectum suggesting constipation. Electronically Signed   By: Marijo Sanes M.D.   On: 04/20/2019 14:30   DG Chest Port 1 View  Result Date: 04/16/2019 CLINICAL DATA:  74 year old male with a history of COVID pneumonia EXAM: PORTABLE CHEST 1 VIEW COMPARISON:  CT 04/09/2019, plain film 04/05/2019 FINDINGS: Cardiomediastinal silhouette unchanged in size and contour. Similar appearance of low lung volumes and mixed airspace and interstitial opacities of the bilateral lungs. No pneumothorax or pleural effusion. IMPRESSION: Unchanged appearance of the lungs with mixed interstitial and airspace disease bilateral compatible with multifocal pneumonia Electronically Signed   By: Corrie Mckusick D.O.   On: 04/16/2019 07:48   DG Chest Port 1 View  Result Date: 04/05/2019 CLINICAL DATA:  COVID positive low oxygen level EXAM: PORTABLE CHEST 1 VIEW COMPARISON:  None. FINDINGS: Bilateral ground-glass opacities and consolidations. No pleural effusion. Normal heart size. Aortic atherosclerosis. No pneumothorax. IMPRESSION: Bilateral ground-glass opacities and consolidations consistent with  bilateral pneumonia. Electronically Signed   By: Donavan Foil M.D.   On: 04/05/2019 16:47   VAS [place] LOWER EXTREMITY VENOUS (DVT)  Result Date: 04/09/2019  Lower Venous Study Indications: Covid positive with worsening positive d-Dimer and hypoxia.  Anticoagulation: Lovenox. Comparison Study: No priors. Performing Technologist: Oda Cogan RDMS, RVT  Examination Guidelines: A complete evaluation includes B-mode imaging, spectral Doppler, color Doppler, and power Doppler as needed of all accessible portions of each vessel. Bilateral testing is considered an integral part of a complete examination. Limited examinations for reoccurring indications may be performed as noted.  +---------+---------------+---------+-----------+----------+--------------+ RIGHT    CompressibilityPhasicitySpontaneityPropertiesThrombus Aging +---------+---------------+---------+-----------+----------+--------------+ CFV      Full           Yes      Yes                                 +---------+---------------+---------+-----------+----------+--------------+ SFJ      Full                                                        +---------+---------------+---------+-----------+----------+--------------+ FV Prox  Full                                                        +---------+---------------+---------+-----------+----------+--------------+ FV Mid   Full                                                        +---------+---------------+---------+-----------+----------+--------------+  FV DistalFull                                                        +---------+---------------+---------+-----------+----------+--------------+ PFV      Full                                                        +---------+---------------+---------+-----------+----------+--------------+ POP      Full           Yes      Yes                                  +---------+---------------+---------+-----------+----------+--------------+ PTV      Full                                                        +---------+---------------+---------+-----------+----------+--------------+ PERO     Full                                                        +---------+---------------+---------+-----------+----------+--------------+   +---------+---------------+---------+-----------+----------+--------------+ LEFT     CompressibilityPhasicitySpontaneityPropertiesThrombus Aging +---------+---------------+---------+-----------+----------+--------------+ CFV      Full           Yes      Yes                                 +---------+---------------+---------+-----------+----------+--------------+ SFJ      Full                                                        +---------+---------------+---------+-----------+----------+--------------+ FV Prox  Full                                                        +---------+---------------+---------+-----------+----------+--------------+ FV Mid   Full                                                        +---------+---------------+---------+-----------+----------+--------------+ FV DistalFull                                                        +---------+---------------+---------+-----------+----------+--------------+  PFV      Full                                                        +---------+---------------+---------+-----------+----------+--------------+ POP      Full           Yes      Yes                                 +---------+---------------+---------+-----------+----------+--------------+ PTV      Full                                                        +---------+---------------+---------+-----------+----------+--------------+ PERO     Partial                                      Acute           +---------+---------------+---------+-----------+----------+--------------+ Soleal   Full                                         Acute          +---------+---------------+---------+-----------+----------+--------------+     Summary: Right: There is no evidence of deep vein thrombosis in the lower extremity. No cystic structure found in the popliteal fossa. Left: Findings consistent with acute deep vein thrombosis involving the left peroneal veins, and left soleal veins. Short segment of localized thrombus in the peroneal and soleal veins in the mid segment.  *See table(s) above for measurements and observations. Electronically signed by Ruta Hinds MD on 04/09/2019 at 2:56:25 PM.    Final

## 2019-04-25 LAB — COMPREHENSIVE METABOLIC PANEL
ALT: 16 U/L (ref 0–44)
AST: 19 U/L (ref 15–41)
Albumin: 3.3 g/dL — ABNORMAL LOW (ref 3.5–5.0)
Alkaline Phosphatase: 61 U/L (ref 38–126)
Anion gap: 9 (ref 5–15)
BUN: 27 mg/dL — ABNORMAL HIGH (ref 8–23)
CO2: 28 mmol/L (ref 22–32)
Calcium: 8.5 mg/dL — ABNORMAL LOW (ref 8.9–10.3)
Chloride: 99 mmol/L (ref 98–111)
Creatinine, Ser: 1.01 mg/dL (ref 0.61–1.24)
GFR calc Af Amer: >60 mL/min (ref >60)
GFR calc non Af Amer: >60 mL/min (ref >60)
Glucose, Bld: 142 mg/dL — ABNORMAL HIGH (ref 70–99)
Potassium: 4.1 mmol/L (ref 3.5–5.1)
Sodium: 136 mmol/L (ref 135–145)
Total Bilirubin: 1.2 mg/dL (ref 0.3–1.2)
Total Protein: 5.8 g/dL — ABNORMAL LOW (ref 6.5–8.1)

## 2019-04-25 LAB — CBC WITH DIFFERENTIAL/PLATELET
Abs Immature Granulocytes: 0.06 10*3/uL (ref 0.00–0.07)
Basophils Absolute: 0.1 10*3/uL (ref 0.0–0.1)
Basophils Relative: 1 %
Eosinophils Absolute: 0.6 10*3/uL — ABNORMAL HIGH (ref 0.0–0.5)
Eosinophils Relative: 6 %
HCT: 38.8 % — ABNORMAL LOW (ref 39.0–52.0)
Hemoglobin: 12.8 g/dL — ABNORMAL LOW (ref 13.0–17.0)
Immature Granulocytes: 1 %
Lymphocytes Relative: 15 %
Lymphs Abs: 1.3 10*3/uL (ref 0.7–4.0)
MCH: 30.3 pg (ref 26.0–34.0)
MCHC: 33 g/dL (ref 30.0–36.0)
MCV: 91.7 fL (ref 80.0–100.0)
Monocytes Absolute: 1.1 10*3/uL — ABNORMAL HIGH (ref 0.1–1.0)
Monocytes Relative: 13 %
Neutro Abs: 5.7 10*3/uL (ref 1.7–7.7)
Neutrophils Relative %: 64 %
Platelets: 203 10*3/uL (ref 150–400)
RBC: 4.23 MIL/uL (ref 4.22–5.81)
RDW: 14.5 % (ref 11.5–15.5)
WBC: 8.8 10*3/uL (ref 4.0–10.5)
nRBC: 0 % (ref 0.0–0.2)

## 2019-04-25 LAB — FUNGUS CULTURE, BLOOD
Culture: NO GROWTH
Culture: NO GROWTH
Special Requests: ADEQUATE
Special Requests: ADEQUATE

## 2019-04-25 LAB — GLUCOSE, CAPILLARY
Glucose-Capillary: 145 mg/dL — ABNORMAL HIGH (ref 70–99)
Glucose-Capillary: 143 mg/dL — ABNORMAL HIGH (ref 70–99)
Glucose-Capillary: 136 mg/dL — ABNORMAL HIGH (ref 70–99)
Glucose-Capillary: 252 mg/dL — ABNORMAL HIGH (ref 70–99)

## 2019-04-25 LAB — BRAIN NATRIURETIC PEPTIDE: B Natriuretic Peptide: 43 pg/mL (ref 0.0–100.0)

## 2019-04-25 LAB — D-DIMER, QUANTITATIVE: D-Dimer, Quant: 2.59 ug/mL-FEU — ABNORMAL HIGH (ref 0.00–0.50)

## 2019-04-25 LAB — MAGNESIUM: Magnesium: 2.2 mg/dL (ref 1.7–2.4)

## 2019-04-25 LAB — C-REACTIVE PROTEIN: CRP: 0.9 mg/dL (ref <1.0)

## 2019-04-25 NOTE — Progress Notes (Signed)
Occupational Therapy Treatment Patient Details Name: Dustin Diaz MRN: [id number] DOB: [birth year] Today's Date: 04/25/2019    History of present illness 74 year old male admitted with Covid /Pneumonia. PMH includes DM, HTN, hyperlipidemia, polymyalgia rheumatica. 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  He was able to stand, walk to bathroom and complete toileting with min guard but desat with mobility and required significant increase in O2 to maintain SpO2>88% (see walking saturation note).  He used a RW for mobility and had no instances of loss of balance or dizziness, only said he "felt like he could not catch his breath."  Educated on pursed lip breathing and provided multiple reminders throughout session.  He was receptive but not consistent with the pursed lip breathing.  Required multiple lengthy rest breaks (>8min) during activity indicating low activity tolerance.  Educated on energy conservation strategies.  Will continue to follow acutely to address the deficits listed below and improve activity tolerance/endurance.      Follow Up Recommendations  Home health OT;Supervision - Intermittent    Equipment Recommendations  None recommended by OT    Recommendations for Other Services      Precautions / Restrictions Precautions Precautions: Fall;Other (comment) Precaution Comments: monitor O2 Sats, and BP Restrictions Weight Bearing Restrictions: No       Mobility Bed Mobility               General bed mobility comments: Up in chair  Transfers Overall transfer level: Needs assistance Equipment used: Rolling walker (2 wheeled) Transfers: Sit to/from Stand Sit to Stand: Min guard              Balance Overall balance assessment: Needs assistance Sitting-balance support: Feet supported Sitting balance-Leahy Scale: Good     Standing balance support: During  functional activity;Bilateral upper extremity supported Standing balance-Leahy Scale: Fair                             ADL either performed or assessed with clinical judgement   ADL Overall ADL's : Needs assistance/impaired     Grooming: Set up;Sitting           Upper Body Dressing : Set up;Sitting   Lower Body Dressing: Min guard;Sit to/from stand   Toilet Transfer: Min guard;Ambulation;Regular Toilet;RW   Toileting- Water quality scientist and Hygiene: Min guard;Sit to/from stand       Functional mobility during ADLs: Passenger transport manager     Praxis      Cognition Arousal/Alertness: Awake/alert Behavior During Therapy: Flat affect;Anxious Overall Cognitive Status: Within Functional Limits for tasks assessed                                          Exercises Exercises: Other exercises Other Exercises Other Exercises: Pursed lip breathing Other Exercises: x10 incentive spirometer Other Exercises: x10 flutter valve   Shoulder Instructions       General Comments      Pertinent Vitals/ Pain       Pain Assessment: No/denies pain  Home Living  Prior Functioning/Environment              Frequency  Min 3X/week        Progress Toward Goals  OT Goals(current goals can now be found in the care plan section)  Progress towards OT goals: Progressing toward goals  Acute Rehab OT Goals Patient Stated Goal: Go back home OT Goal Formulation: With patient Time For Goal Achievement: 04/26/19 Potential to Achieve Goals: Good  Plan Discharge plan needs to be updated    Co-evaluation                 AM-PAC OT "6 Clicks" Daily Activity     Outcome Measure   Help from another person eating meals?: None Help from another person taking care of personal grooming?: A Little Help from another person toileting, which includes  using toliet, bedpan, or urinal?: A Little Help from another person bathing (including washing, rinsing, drying)?: A Little Help from another person to put on and taking off regular upper body clothing?: A Little Help from another person to put on and taking off regular lower body clothing?: A Little 6 Click Score: 19    End of Session Equipment Utilized During Treatment: Gait belt;Rolling walker;Oxygen  OT Visit Diagnosis: Unsteadiness on feet (R26.81);Muscle weakness (generalized) (M62.81)   Activity Tolerance Patient limited by fatigue   Patient Left in chair;with call Diaz/phone within reach   Nurse Communication Mobility status        Time: [id number] OT Time Calculation (min): 54 min  Charges: OT General Charges $OT Visit: 1 Visit OT Treatments $Self Care/Home Management : 38-52 mins $Therapeutic Activity: 8-22 mins  August Luz, OTR/L    Phylliss Bob 04/25/2019, 12:38 PM

## 2019-04-25 NOTE — Progress Notes (Signed)
SATURATION QUALIFICATIONS: (This note is used to comply with regulatory documentation for home oxygen)  Patient Saturations on 5L at Rest = 89%  Patient Saturations on 10L while Ambulating = 75%  Patient Saturations on 15 Liters of oxygen while Ambulating = 88%  Please briefly explain why patient needs home oxygen: Patient desats greatly with mobility.   August Luz, OTR/L

## 2019-04-25 NOTE — Progress Notes (Signed)
PROGRESS NOTE    Dustin Diaz  [id number] DOB: 1945-09-27 DOA: 04/05/2019 PCP: Lavone Orn, MD    Brief Narrative:   Dustin Diaz is a 74 y.o. male with past medical history remarkable for of DM-2, HTN, PMR (on chronic steroids), HLD,  who presented to the hospital on 1/14 with shortness of breath-was found to have acute hypoxic respiratory failure secondary to COVID-19 pneumonia.  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   Assessment & Plan:   Principal Problem:   Pneumonia due to COVID-19 virus Active Problems:   Hypertension   Diabetes ([place])   High cholesterol   Polymyalgia rheumatica ([facility])   Acute hypoxic respiratory failure secondary to acute Covid-19 viral pneumonia during the ongoing Covid 19 Pandemic - POA Patient presenting to ED with progressive shortness of breath and found to be hypoxic secondary to Covid-19 pneumonia.  Initially CRP elevated at 36.4 with a D-dimer of 1.85. Received 2 doses of Actemra on 1/15 and 1/16.  Completed 5-day course of IV remdesivir on 1/18.  Initially started on IV Decadron which has now been weaned back to his home prednisone 3 mg p.o. daily.  Continues with hypoxia at rest on 4L [place], which is extensively worsened with ambulation, requiring up to 10-15 L of supplemental oxygen while working with PT/OT. --Continue home prednisone 3 mg p.o. daily --Continue supplemental oxygen, titrate to maintain SPO2 greater than 92% --Continue supportive care with vitamin C, zinc, Tylenol, Combivent MDI --Continue airborne/contact isolation precautions --Check chest x-ray in the a.m.  HSV 1 infection/skin lesions Patient developed nodular cavitary skin lesions to his neck, lip, and occipital area during hospitalization.  Initially thought secondary to fungal infection.  Underwent punch biopsy by general surgery with biopsy  notable for HSV 1 infection.  Infectious disease was consulted and recommended initiation of IV acyclovir.  Once lesions fully crusted, will transition to oral valacyclovir to complete a total course of 14 days.  Left lower extremity DVT: CTA chest on 04/09/2019 negative for pulmonary embolism. --Continue Eliquis  Type 2 diabetes mellitus Hemoglobin A1c 7.1 on 04/06/2019, well controlled.  On Glimepiride 2 mg p.o. daily, Jardiance 25 mg p.o. daily, Metformin 2000 mg every morning, pioglitazone 45 mg p.o. daily at home. --Holding home hypoglycemic regimen while inpatient --Continue Lantus 6 units subcutaneously daily --Novolog 4u [place] TIDAC --Insulin scale for further coverage  Essential hypertension On amlodipine-valsartan 5-160 mg p.o. daily at home.  Patient with episodes of dizziness, and is antihypertensives were discontinued.  BP 138/71 today, fairly well controlled. --Continue monitoring BP off of his home antihypertensives  Dyslipidemia: Continue statin  History of PMR --Continue prednisone 3 mg p.o. daily (home dose)  Right hilar lymphadenopathy Likely reactive secondary to acute infectious process with Covid-19 pneumonia versus HSV-1 infection. 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  To be coordinated by PCP.   DVT prophylaxis: Eliquis Code Status: Full code Family Communication: Updated patient extensively at bedside Disposition Plan: From home with s, anticipate discharge home in 1-2 days if oxygen requirement improves with home health PT/OT/RN/aide.   Consultants:   General surgery  Infectious disease  Procedures:   Punch biopsy right neck lesion by general surgery on 1/28 2021  Antimicrobials:   Acyclovir 2/1>> Amphotericin B 1/27 - 2/1 Cefepime 1/26 - 1/27 Vancomycin 1/26 -  1/27 Ceftriaxone 1/15 -  1/15 Remdesivir 1/14 - 1/18 Actemra 1/15 and 1/16    Subjective: Patient seen and examined at bedside, resting comfortably.  Continues on 4 L nasal cannula, but becomes significantly hypoxic while ambulating with PT/OT up to 15 L nasal cannula.  Lesions appear to have crusted over now.  Objective: Vitals:   04/24/19 1529 04/24/19 2015 04/25/19 0453 04/25/19 0719  BP: (!) 115/56 125/81 126/67 138/71  Pulse: 82 93 67 79  Resp: 18 20 20 20   Temp: 98 F (36.7 C) 98.2 F (36.8 C) 98.5 F (36.9 C) 98.3 F (36.8 C)  TempSrc: Oral Oral Oral Oral  SpO2: 90% 93% 94% 93%  Weight:      Height:        Intake/Output Summary (Last 24 hours) at 04/25/2019 1409 Last data filed at 04/25/2019 1200 Gross per 24 hour  Intake 1147.39 ml  Output 1150 ml  Net -2.61 ml   Filed Weights   04/06/19 0145  Weight: 78 kg    Examination:  General exam: Appears calm and comfortable  Respiratory system: Clear to auscultation. Respiratory effort normal. Cardiovascular system: S1 & S2 heard, RRR. No JVD, murmurs, rubs, gallops or clicks. No pedal edema. Gastrointestinal system: Abdomen is nondistended, soft and nontender. No organomegaly or masses felt. Normal bowel sounds heard. Central nervous system: Alert and oriented. No focal neurological deficits. Extremities: Symmetric 5 x 5 power. Skin: No rashes, lesions or ulcers Psychiatry: Judgement and insight appear normal. Mood & affect appropriate.     Data Reviewed: I have personally reviewed following labs and imaging studies  CBC: Recent Labs  Lab 04/20/19 0200 04/22/19 0155 04/23/19 0348 04/24/19 0535 04/25/19 0447  WBC 10.9* 10.7* 10.3 8.4 8.8  NEUTROABS  --  7.7 6.9 5.6 5.7  HGB 15.0 13.9 13.9 13.3 12.8*  HCT 47.4 41.6 41.6 40.1 38.8*  MCV 97.1 90.6 91.6 91.3 91.7  PLT 293 241 220 211 [id number]   Basic Metabolic Panel: Recent Labs  Lab 04/21/19 0202 04/22/19 0155 04/23/19 0348 04/24/19 0535 04/25/19 0447  NA 135 135 134* 136 136   K 3.9 3.9 4.5 3.8 4.1  CL 101 99 99 101 99  CO2 24 27 27 27 28   GLUCOSE 114* 119* 115* 134* 142*  BUN 23 23 29* 25* 27*  CREATININE 0.67 0.86 1.16 1.12 1.01  CALCIUM 8.6* 8.4* 8.5* 8.3* 8.5*  MG 2.1 2.3 2.3 2.1 2.2   GFR: Estimated Creatinine Clearance: 68.3 mL/min (by C-G formula based on SCr of 1.01 mg/dL). Liver Function Tests: Recent Labs  Lab 04/20/19 0200 04/22/19 0155 04/23/19 0348 04/24/19 0535 04/25/19 0447  AST 24 20 18 19 19   ALT 18 19 18 16 16   ALKPHOS 72 68 65 57 61  BILITOT 1.2 1.2 1.6* 1.1 1.2  PROT 5.5* 5.8* 5.9* 5.7* 5.8*  ALBUMIN 2.9* 3.1* 3.3* 3.1* 3.3*   No results for input(s): LIPASE, AMYLASE in the last 168 hours. No results for input(s): AMMONIA in the last 168 hours. Coagulation Profile: No results for input(s): INR, PROTIME in the last 168 hours. Cardiac Enzymes: No results for input(s): CKTOTAL, CKMB, CKMBINDEX, TROPONINI in the last 168 hours. BNP (last 3 results) No results for input(s): PROBNP in the last 8760 hours. HbA1C: No results for input(s): HGBA1C in the last 72 hours. CBG: Recent Labs  Lab 04/24/19 1154 04/24/19 1656 04/24/19 2207 04/25/19 0718 04/25/19 1156  GLUCAP 160* 210* 151* 145* 143*   Lipid Profile: No results for input(s):  CHOL, HDL, LDLCALC, TRIG, CHOLHDL, LDLDIRECT in the last 72 hours. Thyroid Function Tests: No results for input(s): TSH, T4TOTAL, FREET4, T3FREE, THYROIDAB in the last 72 hours. Anemia Panel: No results for input(s): VITAMINB12, FOLATE, FERRITIN, TIBC, IRON, RETICCTPCT in the last 72 hours. Sepsis Labs: No results for input(s): PROCALCITON, LATICACIDVEN in the last 168 hours.  Recent Results (from the past 240 hour(s))  MRSA PCR Screening     Status: None   Collection Time: 04/17/19  4:17 PM   Specimen: Nasal Mucosa; Nasopharyngeal  Result Value Ref Range Status   MRSA by PCR NEGATIVE NEGATIVE Final    Comment:        The GeneXpert MRSA Assay (FDA approved for NASAL specimens only), is  one component of a comprehensive MRSA colonization surveillance program. It is not intended to diagnose MRSA infection nor to guide or monitor treatment for MRSA infections. Performed at [facility], [place] [address], [place], [place] [address]   Fungus culture, blood     Status: None   Collection Time: 04/18/19  3:00 PM   Specimen: BLOOD  Result Value Ref Range Status   Specimen Description   Final    BLOOD RIGHT ANTECUBITAL Performed at [facility] [address], [place], [place] [address]    Special Requests   Final    BOTTLES DRAWN AEROBIC ONLY Blood Culture adequate volume Performed at [facility] [address] [place], [place] [address]    Culture   Final    NO GROWTH 7 DAYS NO FUNGUS ISOLATED Performed at [facility], [place] [address] [place], [place] [address]    Report Status 04/25/2019 FINAL  Final  Fungus culture, blood     Status: None   Collection Time: 04/18/19  3:05 PM   Specimen: BLOOD RIGHT HAND  Result Value Ref Range Status   Specimen Description   Final    BLOOD RIGHT HAND Performed at [facility] [address], [place], [place] [address]    Special Requests   Final    BOTTLES DRAWN AEROBIC ONLY Blood Culture adequate volume Performed at [facility] [address], [place], [place] [address]    Culture   Final    NO GROWTH 7 DAYS NO FUNGUS ISOLATED Performed at [facility], [place] [address], [place], [place] [address]    Report Status 04/25/2019 FINAL  Final  Aerobic/Anaerobic Culture (surgical/deep wound)     Status: None   Collection Time: 04/19/19 11:39 AM   Specimen: Biopsy; Tissue  Result Value Ref Range Status   Specimen Description   Final    BIOPSY RT NECK Performed at [facility] [address], [place], [place] [address]    Special Requests   Final    Immunocompromised Performed at [facility], [place] [address] [place], [place] [address]    Gram Stain   Final    NO WBC SEEN NO ORGANISMS SEEN Performed at [facility], [place] [address], [place], [place] [address]    Culture No growth aerobically or anaerobically.  Final   Report Status 04/24/2019 FINAL  Final  Acid Fast Smear (AFB)     Status: None   Collection Time: 04/19/19 11:39 AM   Specimen: Neck, Right  Result Value Ref Range Status   AFB Specimen Processing Concentration  Final   Acid Fast Smear Negative  Final    Comment: (NOTE) Performed  At: [facility] [place], [place] [id number] Rush Farmer MD [id number]    Source (AFB) BIOPSY  Final    Comment: RT NECK Performed at [facility], [place] [address] [place], [place] [address]   Fungus Culture With Stain     Status: None (Preliminary result)   Collection Time: 04/19/19 11:39 AM   Specimen: Neck, Right  Result Value Ref Range Status   Fungus Stain Final report  Final    Comment: (NOTE) Performed At: [facility] [place], [place] [id number] Rush Farmer MD [id number]    Fungus (Mycology) Culture PENDING  Incomplete   Fungal Source BIOPSY  Final    Comment: RT NECK Performed at [facility], [place] [address] [place], [place] [address]   Fungus Culture Result     Status: None   Collection Time: 04/19/19 11:39 AM  Result Value Ref Range Status   Result 1 Comment  Final    Comment: (NOTE) KOH/Calcofluor preparation:  no fungus observed. Performed At: [facility] [place], [place] [id number] Rush Farmer MD [id number]          Radiology Studies: No results found.      Scheduled Meds: . acyclovir ointment   Topical 4x daily  . apixaban  5 mg Oral BID  . vitamin C  500 mg Oral Daily  . atorvastatin  10 mg Oral Daily  . docusate sodium  200 mg Oral BID  . fluticasone  2 spray Each Nare Daily  .  insulin aspart  0-15 Units Subcutaneous TID WC  . insulin aspart  0-5 Units Subcutaneous QHS  . insulin aspart  4 Units Subcutaneous TID WC  . insulin glargine  6 Units Subcutaneous QHS  . Ipratropium-Albuterol  1 puff Inhalation Q6H  . latanoprost  1 drop Both Eyes QHS  . pantoprazole  40 mg Oral Daily  . predniSONE  3 mg Oral Daily  . zinc sulfate  220 mg Oral Daily   Continuous Infusions: . acyclovir 390 mg (04/25/19 0620)     LOS: 20 days    Time spent: 34 minutes spent on chart review, discussion with nursing staff, consultants, updating family and interview/physical exam; more than 50% of that time was spent in counseling and/or coordination of care.    Gerhardt Gleed J [place] DO [facility] Available via Epic secure chat 7am-7pm After these hours, please refer to coverage provider listed on [email or web address] 04/25/2019, 2:09 PM

## 2019-04-25 NOTE — Plan of Care (Signed)
   Vital Signs MEWS/VS Documentation       04/25/2019 0719 04/25/2019 1119 04/25/2019 1608 04/25/2019 1948   MEWS Score:  0  --  0  0   MEWS Score Color:  Green  --  Green  Green   Resp:  20  --  18  20   Pulse:  79  --  88  83   BP:  138/71  --  130/68  (!) 128/59   Temp:  98.3 F (36.8 C)  --  97.9 F (36.6 C)  98.2 F (36.8 C)   O2 Device:  Nasal Cannula  HFNC  HFNC  Nasal Cannula   O2 Flow Rate (L/min):  4 L/min  6 L/min  --  4 L/min   Level of Consciousness:  Alert  --  --  Alert        POC reviewed with pt.  Oral sore on mouth acyclovir applied.    Paticia Stack Johnrobert Foti 04/25/2019,9:41 PM

## 2019-04-26 ENCOUNTER — Inpatient Hospital Stay (HOSPITAL_COMMUNITY): Payer: Medicare HMO

## 2019-04-26 LAB — GLUCOSE, CAPILLARY
Glucose-Capillary: 138 mg/dL — ABNORMAL HIGH (ref 70–99)
Glucose-Capillary: 202 mg/dL — ABNORMAL HIGH (ref 70–99)
Glucose-Capillary: 241 mg/dL — ABNORMAL HIGH (ref 70–99)
Glucose-Capillary: 221 mg/dL — ABNORMAL HIGH (ref 70–99)

## 2019-04-26 MED ORDER — ENSURE ENLIVE PO LIQD
237.0000 mL | Freq: Two times a day (BID) | ORAL | Status: DC
  Administered 2019-04-26 – 2019-04-29 (×5): 237 mL via ORAL

## 2019-04-26 MED ORDER — FUROSEMIDE 10 MG/ML IJ SOLN
40.0000 mg | Freq: Once | INTRAMUSCULAR | Status: AC
  Administered 2019-04-26: 40 mg via INTRAVENOUS
  Filled 2019-04-26: qty 4

## 2019-04-26 MED ORDER — VALACYCLOVIR HCL 500 MG PO TABS
1000.0000 mg | ORAL_TABLET | Freq: Two times a day (BID) | ORAL | Status: DC
  Administered 2019-04-26 – 2019-05-03 (×16): 1000 mg via ORAL
  Filled 2019-04-26 (×16): qty 2

## 2019-04-26 NOTE — Progress Notes (Addendum)
PROGRESS NOTE    [name]  [id number] DOB: 05-08-1945 DOA: 04/05/2019 PCP: Lavone Orn, MD    Brief Narrative:   Dustin Diaz is a 74 y.o. male with past medical history remarkable for of DM-2, HTN, PMR (on chronic steroids), HLD,  who presented to the hospital on 1/14 with shortness of breath-was found to have acute hypoxic respiratory failure secondary to COVID-19 pneumonia.  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   Assessment & Plan:   Principal Problem:   Pneumonia due to COVID-19 virus Active Problems:   Hypertension   Diabetes (Dustin Diaz)   High cholesterol   Polymyalgia rheumatica (HCC)   Acute hypoxic respiratory failure secondary to acute Covid-19 viral pneumonia during the ongoing Covid 19 Pandemic - POA Patient presenting to ED with progressive shortness of breath and found to be hypoxic secondary to Covid-19 pneumonia.  Initially CRP elevated at 36.4 with a D-dimer of 1.85. Received 2 doses of Actemra on 1/15 and 1/16.  Completed 5-day course of IV remdesivir on 1/18.  Initially started on IV Decadron which has now been weaned back to his home prednisone 3 mg p.o. daily.  Continues with hypoxia at rest on 4L [place], which is extensively worsened with ambulation, requiring up to 10-15 L of supplemental oxygen while working with PT/OT. --Continue home prednisone 3 mg p.o. daily --Continue supplemental oxygen, titrate to maintain SPO2 greater than 92% --Continue supportive care with vitamin C, zinc, Tylenol, Combivent MDI --Continue airborne/contact isolation precautions --Lasix 40mg  IV x 1 today --repeat inflammatory markers to include CRP, ferritin and ddimer in the AM  HSV 1 infection/skin lesions Patient developed nodular cavitary skin lesions to his neck, lip, and occipital area during hospitalization.  Initially thought secondary to fungal  infection.  Underwent punch biopsy by general surgery with biopsy notable for HSV 1 infection.  Infectious disease was consulted and recommended initiation of IV acyclovir.  Given the lesions are now crusted over, will transition to oral valacyclovir 1000 mg p.o. twice daily to complete a total course of 14 days.  Left lower extremity DVT: CTA chest on 04/09/2019 negative for pulmonary embolism. --Continue Eliquis  Type 2 diabetes mellitus Hemoglobin A1c 7.1 on 04/06/2019, well controlled.  On Glimepiride 2 mg p.o. daily, Jardiance 25 mg p.o. daily, Metformin 2000 mg every morning, pioglitazone 45 mg p.o. daily at home. --Holding home hypoglycemic regimen while inpatient --Continue Lantus 6 units subcutaneously daily --Novolog 4u [place] TIDAC --Insulin scale for further coverage  Essential hypertension On amlodipine-valsartan 5-160 mg p.o. daily at home.  Patient with episodes of dizziness, and is antihypertensives were discontinued.  BP 138/71 today, fairly well controlled. --Continue monitoring BP off of his home antihypertensives  Dyslipidemia: Continue statin  History of PMR --Continue prednisone 3 mg p.o. daily (home dose)  Right hilar lymphadenopathy Likely reactive secondary to acute infectious process with Covid-19 pneumonia versus HSV-1 infection. 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  To be coordinated by PCP.   DVT prophylaxis: Eliquis Code Status: Full code Family Communication: Updated patient extensively at bedside Disposition Plan: From home with spouse, currently on 4 L nasal cannula but with significant hypoxia with minimal ambulation requiring 10-15 L of high flow oxygen, currently unsafe discharge home at this time mental FiO2 requirements improved.  Anticipate discharge back home  with home health when FiO2 requirements  are more reasonable.   Consultants:   General surgery  Infectious disease  Procedures:   Punch biopsy right neck lesion by general surgery on 1/28 2021  Antimicrobials:   Acyclovir 2/1>> Amphotericin B 1/27 - 2/1 Cefepime 1/26 - 1/27 Vancomycin 1/26 - 1/27 Ceftriaxone 1/15 - 1/15 Remdesivir 1/14 - 1/18 Actemra 1/15 and 1/16    Subjective: Patient seen and examined at bedside, resting comfortably.  Continues on 4 L nasal cannula, but becomes significantly hypoxic while ambulating with PT/OT up to 15 L nasal cannula.  Lesions appear to have crusted over now.  Frustrated regarding length of stay, but understands given his severe dyspnea and hypoxia on ambulation.  No other complaints at this time.  No other acute concerns overnight by nursing staff.  Objective: Vitals:   04/25/19 1608 04/25/19 1948 04/26/19 0417 04/26/19 0800  BP: 130/68 (!) 128/59 118/61 136/63  Pulse: 88 83 64 85  Resp: 18 20 20 20   Temp: 97.9 F (36.6 C) 98.2 F (36.8 C) 97.9 F (36.6 C) 97.6 F (36.4 C)  TempSrc: Oral Oral Oral Oral  SpO2: 98% 92% 92% 92%  Weight:      Height:        Intake/Output Summary (Last 24 hours) at 04/26/2019 1103 Last data filed at 04/26/2019 1047 Gross per 24 hour  Intake 820 ml  Output 2075 ml  Net -1255 ml   Filed Weights   04/06/19 0145  Weight: 78 kg    Examination:  General exam: Appears calm and comfortable  Respiratory system: Clear to auscultation. Respiratory effort normal.  On 4 L nasal cannula at rest and requires 10-15 L high flow nasal cannula on ambulation Cardiovascular system: S1 & S2 heard, RRR. No JVD, murmurs, rubs, gallops or clicks. No pedal edema. Gastrointestinal system: Abdomen is nondistended, soft and nontender. No organomegaly or masses felt. Normal bowel sounds heard. Central nervous system: Alert and oriented. No focal neurological deficits. Extremities: Symmetric 5 x 5 power. Skin: No rashes, lesions or ulcers Psychiatry:  Judgement and insight appear normal. Mood & affect appropriate.     Data Reviewed: I have personally reviewed following labs and imaging studies  CBC: Recent Labs  Lab 04/20/19 0200 04/22/19 0155 04/23/19 0348 04/24/19 0535 04/25/19 0447  WBC 10.9* 10.7* 10.3 8.4 8.8  NEUTROABS  --  7.7 6.9 5.6 5.7  HGB 15.0 13.9 13.9 13.3 12.8*  HCT 47.4 41.6 41.6 40.1 38.8*  MCV 97.1 90.6 91.6 91.3 91.7  PLT 293 241 220 211 [id number]   Basic Metabolic Panel: Recent Labs  Lab 04/21/19 0202 04/22/19 0155 04/23/19 0348 04/24/19 0535 04/25/19 0447  NA 135 135 134* 136 136  K 3.9 3.9 4.5 3.8 4.1  CL 101 99 99 101 99  CO2 24 27 27 27 28   GLUCOSE 114* 119* 115* 134* 142*  BUN 23 23 29* 25* 27*  CREATININE 0.67 0.86 1.16 1.12 1.01  CALCIUM 8.6* 8.4* 8.5* 8.3* 8.5*  MG 2.1 2.3 2.3 2.1 2.2   GFR: Estimated Creatinine Clearance: 68.3 mL/min (by C-G formula based on SCr of 1.01 mg/dL). Liver Function Tests: Recent Labs  Lab 04/20/19 0200 04/22/19 0155 04/23/19 0348 04/24/19 0535 04/25/19 0447  AST 24 20 18 19 19   ALT 18 19 18 16 16   ALKPHOS 72 68 65 57 61  BILITOT 1.2 1.2 1.6* 1.1 1.2  PROT 5.5* 5.8* 5.9* 5.7* 5.8*  ALBUMIN 2.9* 3.1* 3.3* 3.1* 3.3*   No results  for input(s): LIPASE, AMYLASE in the last 168 hours. No results for input(s): AMMONIA in the last 168 hours. Coagulation Profile: No results for input(s): INR, PROTIME in the last 168 hours. Cardiac Enzymes: No results for input(s): CKTOTAL, CKMB, CKMBINDEX, TROPONINI in the last 168 hours. BNP (last 3 results) No results for input(s): PROBNP in the last 8760 hours. HbA1C: No results for input(s): HGBA1C in the last 72 hours. CBG: Recent Labs  Lab 04/25/19 0718 04/25/19 1156 04/25/19 1611 04/25/19 1932 04/26/19 0735  GLUCAP 145* 143* 136* 252* 138*   Lipid Profile: No results for input(s): CHOL, HDL, LDLCALC, TRIG, CHOLHDL, LDLDIRECT in the last 72 hours. Thyroid Function Tests: No results for input(s): TSH,  T4TOTAL, FREET4, T3FREE, THYROIDAB in the last 72 hours. Anemia Panel: No results for input(s): VITAMINB12, FOLATE, FERRITIN, TIBC, IRON, RETICCTPCT in the last 72 hours. Sepsis Labs: No results for input(s): PROCALCITON, LATICACIDVEN in the last 168 hours.  Recent Results (from the past 240 hour(s))  MRSA PCR Screening     Status: None   Collection Time: 04/17/19  4:17 PM   Specimen: Nasal Mucosa; Nasopharyngeal  Result Value Ref Range Status   MRSA by PCR NEGATIVE NEGATIVE Final    Comment:        The GeneXpert MRSA Assay (FDA approved for NASAL specimens only), is one component of a comprehensive MRSA colonization surveillance program. It is not intended to diagnose MRSA infection nor to guide or monitor treatment for MRSA infections. Performed at [facility], [place] [address], [place], [place] [address]   Fungus culture, blood     Status: None   Collection Time: 04/18/19  3:00 PM   Specimen: BLOOD  Result Value Ref Range Status   Specimen Description   Final    BLOOD RIGHT ANTECUBITAL Performed at [place] [address] [place], [place] [address]    Special Requests   Final    BOTTLES DRAWN AEROBIC ONLY Blood Culture adequate volume Performed at [facility] [address], [place], [place] [address]    Culture   Final    NO GROWTH 7 DAYS NO FUNGUS ISOLATED Performed at [facility], [place] [address], [place], [place] [address]    Report Status 04/25/2019 FINAL  Final  Fungus culture, blood     Status: None   Collection Time: 04/18/19  3:05 PM   Specimen: BLOOD RIGHT HAND  Result Value Ref Range Status   Specimen Description   Final    BLOOD RIGHT HAND Performed at [place] [address] [place], [place] [address]    Special Requests   Final    BOTTLES DRAWN AEROBIC ONLY Blood Culture adequate volume Performed at [place] [address],  [place], [place] [address]    Culture   Final    NO GROWTH 7 DAYS NO FUNGUS ISOLATED Performed at [facility], [place] [address], [place], [place] [address]    Report Status 04/25/2019 FINAL  Final  Aerobic/Anaerobic Culture (surgical/deep wound)     Status: None   Collection Time: 04/19/19 11:39 AM   Specimen: Biopsy; Tissue  Result Value Ref Range Status   Specimen Description   Final    BIOPSY RT NECK Performed at [place] [address], [place], [place] [address]    Special Requests   Final    Immunocompromised Performed at [facility], [place] [address] [place],  [place] [address]    Gram Stain   Final    NO WBC SEEN NO ORGANISMS SEEN Performed at [facility], [place] [address] [place], [place] [address]    Culture No growth aerobically or anaerobically.  Final   Report Status 04/24/2019 FINAL  Final  Acid Fast Smear (AFB)     Status: None   Collection Time: 04/19/19 11:39 AM   Specimen: Neck, Right  Result Value Ref Range Status   AFB Specimen Processing Concentration  Final   Acid Fast Smear Negative  Final    Comment: (NOTE) Performed At: [facility] [place], [place] [id number] Rush Farmer MD [id number]    Source (AFB) BIOPSY  Final    Comment: RT NECK Performed at [facility], [place] [address], [place], [place] [address]   Fungus Culture With Stain     Status: None (Preliminary result)   Collection Time: 04/19/19 11:39 AM   Specimen: Neck, Right  Result Value Ref Range Status   Fungus Stain Final report  Final    Comment: (NOTE) Performed At: [facility] [place], [place] [id number] Rush Farmer MD [id number]    Fungus (Mycology) Culture PENDING  Incomplete   Fungal Source BIOPSY  Final    Comment: RT NECK Performed at [facility], [place] [address] [place], [place] [address]   Fungus Culture Result     Status: None    Collection Time: 04/19/19 11:39 AM  Result Value Ref Range Status   Result 1 Comment  Final    Comment: (NOTE) KOH/Calcofluor preparation:  no fungus observed. Performed At: [facility] [place], [place] [id number] Rush Farmer MD [id number]          Radiology Studies: DG CHEST PORT 1 VIEW  Result Date: 04/26/2019 CLINICAL DATA:  Shortness of breath. EXAM: PORTABLE CHEST 1 VIEW COMPARISON:  04/20/2019. FINDINGS: Left lower lateral portion of the chest not imaged. Heart size stable. Prominent multifocal bilateral pulmonary infiltrates are again noted without significant interim change. Moderate right pleural effusion noted on today's exam. No pneumothorax. No acute bony abnormality. IMPRESSION: Prominent multifocal bilateral pulmonary infiltrates are again noted without significant interim change. Electronically Signed   By: Marcello Moores  Register   On: 04/26/2019 08:41        Scheduled Meds: . acyclovir ointment   Topical 4x daily  . apixaban  5 mg Oral BID  . vitamin C  500 mg Oral Daily  . atorvastatin  10 mg Oral Daily  . docusate sodium  200 mg Oral BID  . fluticasone  2 spray Each Nare Daily  . insulin aspart  0-15 Units Subcutaneous TID WC  . insulin aspart  0-5 Units Subcutaneous QHS  . insulin aspart  4 Units Subcutaneous TID WC  . insulin glargine  6 Units Subcutaneous QHS  . Ipratropium-Albuterol  1 puff Inhalation Q6H  . latanoprost  1 drop Both Eyes QHS  . pantoprazole  40 mg Oral Daily  . predniSONE  3 mg Oral Daily  . zinc sulfate  220 mg Oral Daily   Continuous Infusions: . acyclovir 390 mg (04/26/19 0606)     LOS: 21 days    Time spent: 34 minutes spent on chart review, discussion with nursing staff, consultants, updating family and interview/physical exam; more than 50% of that time was spent in counseling and/or coordination of care.    Katianna Mcclenney J [place] DO [facility] Available via Epic secure  chat 7am-7pm After  these hours, please refer to coverage provider listed on [email or web address] 04/26/2019, 11:03 AM

## 2019-04-26 NOTE — Progress Notes (Signed)
Physical Therapy Treatment Patient Details Name: Dustin Diaz MRN: [id number] DOB: 05-Dec-1945 Today's Date: 04/26/2019    History of Present Illness 74 year old male admitted with Covid /Pneumonia. PMH includes DM, HTN, hyperlipidemia, polymyalgia rheumatica. herpes virus on lip and neck    PT Comments    Patient lying in bed when PT arrived. Requested to ambulate and needed to go to the bathroom. Ambulated with portable O2, RW, and assistance with IV pole. Once in bathroom, within 5 min he was desatting to 74%. RN notified. Increased oxygen and at 8 LPM, [place], finally able to recover to 89%. He becomes very anxious and borderline hyperventilating- Needs close monitoring. He was reviewed on IS and Flutter valve unit- good return demo with both. Max cues for pursed lip breathing- Should continue benefit from PT to further address goals related to endurance, energy conservation, postural awareness, strengthening and gait safety for optimal functional outcomes .  Follow Up Recommendations  Home health PT;Supervision - Intermittent;Supervision for mobility/OOB     Equipment Recommendations  Rolling walker with 5" wheels    Recommendations for Other Services       Precautions / Restrictions Precautions Precautions: Fall;Other (comment) Precaution Comments: monitor O2 Sats, and BP Restrictions Weight Bearing Restrictions: No    Mobility  Bed Mobility Overal bed mobility: Modified Independent             General bed mobility comments: Up in chair  Transfers Overall transfer level: Needs assistance Equipment used: Rolling walker (2 wheeled) Transfers: Sit to/from Stand   Stand pivot transfers: Min guard          Ambulation/Gait Ambulation/Gait assistance: Min guard   Assistive device: Rolling walker (2 wheeled) Gait Pattern/deviations: Step-through pattern Gait velocity: decr Gait velocity interpretation: 1.31 - 2.62 ft/sec, indicative of limited community  ambulator General Gait Details: ambulated from bed to rest room able to use rest room and ambulate back to bed using RW, O2 from 4 to 8 LPM, [place]- with max cues for slowing breathing and using proper breathing techniques/   Stairs             Wheelchair Mobility    Modified Rankin (Stroke Patients Only)       Balance Overall balance assessment: Needs assistance Sitting-balance support: Feet supported Sitting balance-Leahy Scale: Good     Standing balance support: During functional activity;Bilateral upper extremity supported Standing balance-Leahy Scale: Fair Standing balance comment: steady assist while pulling up briefs                            Cognition Arousal/Alertness: Awake/alert Behavior During Therapy: Flat affect;Anxious Overall Cognitive Status: Within Functional Limits for tasks assessed                                 General Comments: Tends to get anxious to point of hyperventilating and desatting. Recovery time today was several minutes (> 5) for 89%      Exercises Other Exercises Other Exercises: Pursed lip breathing Other Exercises: x10 incentive spirometer Other Exercises: x10 flutter valve    General Comments General comments (skin integrity, edema, etc.): Monitor closely.He is anxious, and to the point of hyperventilating. Desatted to 74% while in BR, and had to increase O2 to 8 LPM, [place]- able to ambulate back to [facility] chair.      Pertinent Vitals/Pain Pain Assessment: No/denies pain  Home Living                      Prior Function            PT Goals (current goals can now be found in the care plan section) Acute Rehab PT Goals Patient Stated Goal: Go back home PT Goal Formulation: With patient Time For Goal Achievement: 05/08/19 Potential to Achieve Goals: Fair Progress towards PT goals: (Still limited by anxiety and tendency to hyperventilate/desat)    Frequency    Min 3X/week      PT  Plan Current plan remains appropriate    Co-evaluation              AM-PAC PT "6 Clicks" Mobility   Outcome Measure  Help needed turning from your back to your side while in a flat bed without using bedrails?: None Help needed moving from lying on your back to sitting on the side of a flat bed without using bedrails?: None Help needed moving to and from a bed to a chair (including a wheelchair)?: A Little Help needed standing up from a chair using your arms (e.g., wheelchair or bedside chair)?: A Little Help needed to walk in hospital room?: A Little Help needed climbing 3-5 steps with a railing? : A Lot 6 Click Score: 19    End of Session Equipment Utilized During Treatment: Oxygen Activity Tolerance: Patient limited by fatigue;Other (comment)(tends to get anxious and borderline hyperventilate.) Patient left: with call bell/phone within reach;in chair Nurse Communication: Mobility status(RN aware of circumstances) PT Visit Diagnosis: Unsteadiness on feet (R26.81);Other abnormalities of gait and mobility (R26.89);Muscle weakness (generalized) (M62.81)     Time: 1020-1100 PT Time Calculation (min) (ACUTE ONLY): 40 min  Charges:  $Gait Training: 8-22 mins $Therapeutic Exercise: 8-22 mins $Therapeutic Activity: 8-22 mins                    Rollen Sox, PT # [phone] CGV cell   Casandra Doffing 04/26/2019, 11:46 AM

## 2019-04-26 NOTE — Progress Notes (Signed)
Nutrition Follow-up  DOCUMENTATION CODES:   Not applicable  INTERVENTION:   Pt receiving Hormel Shake daily with Breakfast which provides 520 kcals and 22 g of protein and Magic cup BID with lunch and dinner, each supplement provides 290 kcal and 9 grams of protein, automatically on meal trays to optimize nutritional intake.   Encourage PO intake - reached out to pt's RN  Ensure Enlive po BID, each supplement provides 350 kcal and 20 grams of protein   NUTRITION DIAGNOSIS:   Increased nutrient needs related to catabolic [id number] PNA) as evidenced by estimated needs. Ongoing.   GOAL:   Patient will meet greater than or equal to 90% of their needs Progressing.   MONITOR:   PO intake, Supplement acceptance  REASON FOR ASSESSMENT:   Malnutrition Screening Tool    ASSESSMENT:   Pt with PMH of DM, HTN, HLD, and polymyalgia rheumatica who is admitted with SOB x 1 week when dx with COVID-19.   Per RN pt is eating and drinking well.  Pt remains on 4 L O2 via HFNC with pt requiring 10 -15 L with minimal activity.   Meal Completion has decreased to 25-60 % of meals.   Per I&D pt with nodular lesions in neck/lip that have started to cavitate - followed by I&D, biopsy consistent with HSV-1 infection.   Medications reviewed and include: vitamin C, colace, SSI with meals/bedtime, 4 units novolog with meals, 6 units lantus, prednisone, zinc  Labs reviewed:  CBG's: [phone] Lab Results  Component Value Date   HGBA1C 7.1 (H) 04/06/2019     NUTRITION - FOCUSED PHYSICAL EXAM:  Deferred   Diet Order:   Diet Order            Diet heart healthy/carb modified Room service appropriate? Yes; Fluid consistency: Thin  Diet effective now              EDUCATION NEEDS:   Not appropriate for education at this time  Skin:  Skin Assessment: (cavitary lesions neck and lip)  Last BM:  2/4  Height:   Ht Readings from Last 1 Encounters:  04/06/19 5\' 10"  (1.778 m)     Weight:   Wt Readings from Last 1 Encounters:  04/06/19 78 kg    Ideal Body Weight:  75.4 kg  BMI:  Body mass index is 24.67 kg/m.  Estimated Nutritional Needs:   Kcal:  1950-2300  Protein:  115-132 grams  Fluid:  >1.9 L/day  Dustin Diaz RD, LDN, CNSC [phone] Pager [phone] After Hours Pager

## 2019-04-26 NOTE — Progress Notes (Signed)
Pharmacy Antibiotic Note  Dustin Diaz is a 74 y.o. 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 Noted risk factors of chronic prednisone/Actemra doses 1/15 and 1/16.    Pharmacy has been consulted for amphotericin B dosing.   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 D/w Dr. Candiss Norse and we will transition ampho B to acyclovir. We will use the non-encephalitis dose to avoid nephrotoxicity. We can transition to PO Valtrex when ready for PO  Scr 1.1 CrCl 68  Pt is now ready to transition to PO Valtrex per Dr. [place] We will continue the Valtrex to complete a total of 14 days of therapy.   Plan: Dc IV acyclovir Valtrex 1g PO BID x 11 more days Rx signs off  Height: 5\' 10"  (177.8 cm) Weight: 171 lb 15.3 oz (78 kg) IBW/kg (Calculated) : 73  Temp (24hrs), Avg:97.9 F (36.6 C), Min:97.6 F (36.4 C), Max:98.2 F (36.8 C)  Recent Labs  Lab 04/20/19 0200 04/20/19 0200 04/21/19 0202 04/22/19 0155 04/23/19 0348 04/24/19 0535 04/25/19 0447  WBC 10.9*  --   --  10.7* 10.3 8.4 8.8  CREATININE 0.67   < > 0.67 0.86 1.16 1.12 1.01   < > = values in this interval not displayed.    Estimated Creatinine Clearance: 68.3 mL/min (by C-G formula based on SCr of 1.01 mg/dL).    Allergies  Allergen Reactions  . Codeine     Onnie Boer, PharmD, [place], AAHIVP, CPP Infectious Disease Pharmacist 04/26/2019 12:15 PM

## 2019-04-26 NOTE — Plan of Care (Signed)
   Vital Signs MEWS/VS Documentation       04/26/2019 0500 04/26/2019 0800 04/26/2019 1134 04/26/2019 1800   MEWS Score:  0  0  --  0   MEWS Score Color:  Green  Green  --  Green   Resp:  --  20  --  20   Pulse:  --  85  --  --   BP:  --  136/63  --  129/72   Temp:  --  97.6 F (36.4 C)  --  98.3 F (36.8 C)   O2 Device:  --  Nasal Cannula  Nasal Cannula  Nasal Cannula   O2 Flow Rate (L/min):  --  4 L/min  4 L/min  4 L/min   Level of Consciousness:  Alert  Alert  --  Alert       POC reviewed with pt     Dustin Diaz [name] 04/26/2019,7:43 PM

## 2019-04-27 LAB — CBC
HCT: 39.7 % (ref 39.0–52.0)
Hemoglobin: 12.7 g/dL — ABNORMAL LOW (ref 13.0–17.0)
MCH: 29.8 pg (ref 26.0–34.0)
MCHC: 32 g/dL (ref 30.0–36.0)
MCV: 93.2 fL (ref 80.0–100.0)
Platelets: 213 10*3/uL (ref 150–400)
RBC: 4.26 MIL/uL (ref 4.22–5.81)
RDW: 14.6 % (ref 11.5–15.5)
WBC: 8.5 10*3/uL (ref 4.0–10.5)
nRBC: 0 % (ref 0.0–0.2)

## 2019-04-27 LAB — GLUCOSE, CAPILLARY
Glucose-Capillary: 138 mg/dL — ABNORMAL HIGH (ref 70–99)
Glucose-Capillary: 313 mg/dL — ABNORMAL HIGH (ref 70–99)
Glucose-Capillary: 164 mg/dL — ABNORMAL HIGH (ref 70–99)
Glucose-Capillary: 138 mg/dL — ABNORMAL HIGH (ref 70–99)
Glucose-Capillary: 302 mg/dL — ABNORMAL HIGH (ref 70–99)

## 2019-04-27 LAB — BASIC METABOLIC PANEL
Anion gap: 7 (ref 5–15)
BUN: 30 mg/dL — ABNORMAL HIGH (ref 8–23)
CO2: 30 mmol/L (ref 22–32)
Calcium: 8.5 mg/dL — ABNORMAL LOW (ref 8.9–10.3)
Chloride: 101 mmol/L (ref 98–111)
Creatinine, Ser: 1.12 mg/dL (ref 0.61–1.24)
GFR calc Af Amer: >60 mL/min (ref >60)
GFR calc non Af Amer: >60 mL/min (ref >60)
Glucose, Bld: 150 mg/dL — ABNORMAL HIGH (ref 70–99)
Potassium: 4.5 mmol/L (ref 3.5–5.1)
Sodium: 138 mmol/L (ref 135–145)

## 2019-04-27 LAB — FERRITIN: Ferritin: 247 ng/mL (ref 24–336)

## 2019-04-27 LAB — D-DIMER, QUANTITATIVE: D-Dimer, Quant: 2.38 ug/mL-FEU — ABNORMAL HIGH (ref 0.00–0.50)

## 2019-04-27 LAB — C-REACTIVE PROTEIN: CRP: 0.8 mg/dL (ref <1.0)

## 2019-04-27 MED ORDER — FUROSEMIDE 10 MG/ML IJ SOLN
40.0000 mg | Freq: Once | INTRAMUSCULAR | Status: AC
  Administered 2019-04-27: 40 mg via INTRAVENOUS
  Filled 2019-04-27: qty 4

## 2019-04-27 MED ORDER — INSULIN ASPART 100 UNIT/ML ~~LOC~~ SOLN
6.0000 [IU] | Freq: Three times a day (TID) | SUBCUTANEOUS | Status: DC
  Administered 2019-04-27 – 2019-05-03 (×16): 6 [IU] via SUBCUTANEOUS

## 2019-04-27 MED ORDER — INSULIN GLARGINE 100 UNIT/ML ~~LOC~~ SOLN
8.0000 [IU] | Freq: Every day | SUBCUTANEOUS | Status: DC
  Administered 2019-04-27 – 2019-05-02 (×6): 8 [IU] via SUBCUTANEOUS
  Filled 2019-04-27 (×7): qty 0.08

## 2019-04-27 MED ORDER — INSULIN ASPART 100 UNIT/ML ~~LOC~~ SOLN
2.0000 [IU] | Freq: Once | SUBCUTANEOUS | Status: AC
  Administered 2019-04-27: 2 [IU] via SUBCUTANEOUS

## 2019-04-27 NOTE — Progress Notes (Signed)
PROGRESS NOTE    Dustin Diaz  [id number] DOB: 05/29/1945 DOA: 04/05/2019 PCP: Lavone Orn, MD    Brief Narrative:   Dustin Diaz is a 74 y.o. male with past medical history remarkable for of DM-2, HTN, PMR (on chronic steroids), HLD,  who presented to the hospital on 1/14 with shortness of breath-was found to have acute hypoxic respiratory failure secondary to COVID-19 pneumonia.  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   Assessment & Plan:   Principal Problem:   Pneumonia due to COVID-19 virus Active Problems:   Hypertension   Diabetes ([place])   High cholesterol   Polymyalgia rheumatica (HCC)   Acute hypoxic respiratory failure secondary to acute Covid-19 viral pneumonia during the ongoing Covid 19 Pandemic - POA Patient presenting to ED with progressive shortness of breath and found to be hypoxic secondary to Covid-19 pneumonia.  Initially CRP elevated at 36.4 with a D-dimer of 1.85. Received 2 doses of Actemra on 1/15 and 1/16.  Completed 5-day course of IV remdesivir on 1/18.  Initially started on IV Decadron which has now been weaned back to his home prednisone 3 mg p.o. daily.  Continues with hypoxia at rest on 4L [place], which is extensively worsened with ambulation, requiring up to 10-15 L of supplemental oxygen while working with PT/OT. --Continue home prednisone 3 mg p.o. daily --Continue supplemental oxygen, titrate to maintain SPO2 greater than 92% --Continue supportive care with vitamin C, zinc, Tylenol, Combivent MDI --Continue airborne/contact isolation precautions --repeat Lasix 40mg  IV x 1 today  HSV 1 infection/skin lesions Patient developed nodular cavitary skin lesions to his neck, lip, and occipital area during hospitalization.  Initially thought secondary to fungal infection.  Underwent punch biopsy by general surgery with biopsy  notable for HSV 1 infection.  Infectious disease was consulted and recommended initiation of IV acyclovir.  Given the lesions are now crusted over,  transitioned to oral valacyclovir 1000 mg p.o. twice daily to complete a total course of 14 days.  Left lower extremity DVT: CTA chest on 04/09/2019 negative for pulmonary embolism. --Continue Eliquis  Type 2 diabetes mellitus Hemoglobin A1c 7.1 on 04/06/2019, well controlled.  On Glimepiride 2 mg p.o. daily, Jardiance 25 mg p.o. daily, Metformin 2000 mg every morning, pioglitazone 45 mg p.o. daily at home. --Holding home hypoglycemic regimen while inpatient --Continue Lantus 8 units subcutaneously daily --Novolog 6u [place] TIDAC --Insulin scale for further coverage  Essential hypertension On amlodipine-valsartan 5-160 mg p.o. daily at home.  Patient with episodes of dizziness, and is antihypertensives were discontinued.  BP 120/70 today, fairly well controlled. --Continue monitoring BP off of his home antihypertensives  Dyslipidemia: Continue statin  History of PMR --Continue prednisone 3 mg p.o. daily (home dose)  Right hilar lymphadenopathy Likely reactive secondary to acute infectious process with Covid-19 pneumonia versus HSV-1 infection. 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  To be coordinated by PCP.   DVT prophylaxis: Eliquis Code Status: Full code Family Communication: Updated patient extensively at bedside Disposition Plan: From home with spouse, currently on 4 L nasal cannula but with significant hypoxia with minimal ambulation requiring 10-15 L of high flow oxygen, currently unsafe discharge home at this time mental FiO2 requirements improved.  Anticipate discharge back home with home health when FiO2 requirements are more reasonable.  Consultants:   General  surgery  Infectious disease  Procedures:   Punch biopsy right neck lesion by general surgery on 1/28 2021  Antimicrobials:   Acyclovir 2/1>> Amphotericin B 1/27 - 2/1 Cefepime 1/26 - 1/27 Vancomycin 1/26 - 1/27 Ceftriaxone 1/15 - 1/15 Remdesivir 1/14 - 1/18 Actemra 1/15 and 1/16    Subjective: Patient seen and examined at bedside, resting comfortably.  Continues on 4 L nasal cannula, but becomes significantly hypoxic while ambulating with PT/OT up to 15 L nasal cannula.  Lesions appear to have crusted over now.  Continues with frustration regarding length of stay, but understands given his severe dyspnea and hypoxia on ambulation.  No other complaints at this time.  No other acute concerns overnight by nursing staff.  Objective: Vitals:   04/26/19 1800 04/26/19 1928 04/27/19 0300 04/27/19 0800  BP: 129/72 109/64 120/70 (!) 126/58  Pulse:  94 69 83  Resp: 20 18 17 18   Temp: 98.3 F (36.8 C) 98.5 F (36.9 C) 97.7 F (36.5 C) 98.2 F (36.8 C)  TempSrc: Oral Oral Oral Oral  SpO2: 96% 92% 100% 98%  Weight:      Height:        Intake/Output Summary (Last 24 hours) at 04/27/2019 1352 Last data filed at 04/27/2019 [id number] Gross per 24 hour  Intake 720 ml  Output 675 ml  Net 45 ml   Filed Weights   04/06/19 0145  Weight: 78 kg    Examination:  General exam: Appears calm and comfortable  Respiratory system: Clear to auscultation. Respiratory effort normal.  On 4 L nasal cannula at rest and requires 10-15 L high flow nasal cannula on ambulation Cardiovascular system: S1 & S2 heard, RRR. No JVD, murmurs, rubs, gallops or clicks. No pedal edema. Gastrointestinal system: Abdomen is nondistended, soft and nontender. No organomegaly or masses felt. Normal bowel sounds heard. Central nervous system: Alert and oriented. No focal neurological deficits. Extremities: Symmetric 5 x 5 power. Skin: No rashes, lesions or ulcers Psychiatry: Judgement and insight appear normal. Mood &  affect appropriate.     Data Reviewed: I have personally reviewed following labs and imaging studies  CBC: Recent Labs  Lab 04/22/19 0155 04/23/19 0348 04/24/19 0535 04/25/19 0447 04/27/19 0421  WBC 10.7* 10.3 8.4 8.8 8.5  NEUTROABS 7.7 6.9 5.6 5.7  --   HGB 13.9 13.9 13.3 12.8* 12.7*  HCT 41.6 41.6 40.1 38.8* 39.7  MCV 90.6 91.6 91.3 91.7 93.2  PLT 241 220 211 203 [id number]   Basic Metabolic Panel: Recent Labs  Lab 04/21/19 0202 04/21/19 0202 04/22/19 0155 04/23/19 0348 04/24/19 0535 04/25/19 0447 04/27/19 0421  NA 135   < > 135 134* 136 136 138  K 3.9   < > 3.9 4.5 3.8 4.1 4.5  CL 101   < > 99 99 101 99 101  CO2 24   < > 27 27 27 28 30   GLUCOSE 114*   < > 119* 115* 134* 142* 150*  BUN 23   < > 23 29* 25* 27* 30*  CREATININE 0.67   < > 0.86 1.16 1.12 1.01 1.12  CALCIUM 8.6*   < > 8.4* 8.5* 8.3* 8.5* 8.5*  MG 2.1  --  2.3 2.3 2.1 2.2  --    < > = values in this interval not displayed.   GFR: Estimated Creatinine Clearance: 61.6 mL/min (by C-G formula based on SCr of 1.12 mg/dL). Liver Function Tests: Recent Labs  Lab 04/22/19 0155 04/23/19 0348  04/24/19 0535 04/25/19 0447  AST 20 18 19 19   ALT 19 18 16 16   ALKPHOS 68 65 57 61  BILITOT 1.2 1.6* 1.1 1.2  PROT 5.8* 5.9* 5.7* 5.8*  ALBUMIN 3.1* 3.3* 3.1* 3.3*   No results for input(s): LIPASE, AMYLASE in the last 168 hours. No results for input(s): AMMONIA in the last 168 hours. Coagulation Profile: No results for input(s): INR, PROTIME in the last 168 hours. Cardiac Enzymes: No results for input(s): CKTOTAL, CKMB, CKMBINDEX, TROPONINI in the last 168 hours. BNP (last 3 results) No results for input(s): PROBNP in the last 8760 hours. HbA1C: No results for input(s): HGBA1C in the last 72 hours. CBG: Recent Labs  Lab 04/26/19 1634 04/26/19 1925 04/27/19 0755 04/27/19 1126 04/27/19 1255  GLUCAP 241* 221* 138* 313* 164*   Lipid Profile: No results for input(s): CHOL, HDL, LDLCALC, TRIG, CHOLHDL,  LDLDIRECT in the last 72 hours. Thyroid Function Tests: No results for input(s): TSH, T4TOTAL, FREET4, T3FREE, THYROIDAB in the last 72 hours. Anemia Panel: Recent Labs    04/27/19 0421  FERRITIN 247   Sepsis Labs: No results for input(s): PROCALCITON, LATICACIDVEN in the last 168 hours.  Recent Results (from the past 240 hour(s))  MRSA PCR Screening     Status: None   Collection Time: 04/17/19  4:17 PM   Specimen: Nasal Mucosa; Nasopharyngeal  Result Value Ref Range Status   MRSA by PCR NEGATIVE NEGATIVE Final    Comment:        The GeneXpert MRSA Assay (FDA approved for NASAL specimens only), is one component of a comprehensive MRSA colonization surveillance program. It is not intended to diagnose MRSA infection nor to guide or monitor treatment for MRSA infections. Performed at [facility], [place] [address] [place], [place] [address]   Fungus culture, blood     Status: None   Collection Time: 04/18/19  3:00 PM   Specimen: BLOOD  Result Value Ref Range Status   Specimen Description   Final    BLOOD RIGHT ANTECUBITAL Performed at [facility] [address], [place], [place] [address]    Special Requests   Final    BOTTLES DRAWN AEROBIC ONLY Blood Culture adequate volume Performed at [facility] [address] [place], [place] [address]    Culture   Final    NO GROWTH 7 DAYS NO FUNGUS ISOLATED Performed at [facility], [place] [address], [place], [place] [address]    Report Status 04/25/2019 FINAL  Final  Fungus culture, blood     Status: None   Collection Time: 04/18/19  3:05 PM   Specimen: BLOOD RIGHT HAND  Result Value Ref Range Status   Specimen Description   Final    BLOOD RIGHT HAND Performed at [facility] [address], [place], [place] [address]    Special Requests   Final    BOTTLES DRAWN AEROBIC ONLY Blood Culture adequate volume Performed at [facility] [address] [place], [place] [address]    Culture   Final    NO GROWTH 7 DAYS NO FUNGUS ISOLATED Performed at [facility], [place] [address] [place], [place] [address]    Report Status 04/25/2019 FINAL  Final  Aerobic/Anaerobic Culture (surgical/deep wound)     Status: None   Collection Time: 04/19/19 11:39 AM   Specimen: Biopsy; Tissue  Result Value Ref Range Status   Specimen Description   Final  BIOPSY RT NECK Performed at [facility], [place] [address], [place], [place] [address]    Special Requests   Final    Immunocompromised Performed at [facility], [place] [address] [place], [place] [address]    Gram Stain   Final    NO WBC SEEN NO ORGANISMS SEEN Performed at [facility], [place] [address], [place], [place] [address]    Culture No growth aerobically or anaerobically.  Final   Report Status 04/24/2019 FINAL  Final  Acid Fast Smear (AFB)     Status: None   Collection Time: 04/19/19 11:39 AM   Specimen: Neck, Right  Result Value Ref Range Status   AFB Specimen Processing Concentration  Final   Acid Fast Smear Negative  Final    Comment: (NOTE) Performed At: [facility] [place], [place] [id number] Rush Farmer MD [id number]    Source (AFB) BIOPSY  Final    Comment: RT NECK Performed at [facility], [place] [address] [place], [place] [address]   Fungus Culture With Stain     Status: None (Preliminary result)   Collection Time: 04/19/19 11:39 AM   Specimen: Neck, Right  Result Value Ref Range Status   Fungus Stain Final report  Final    Comment: (NOTE) Performed At: [facility] [place], [place] [id number] Rush Farmer MD [id number]    Fungus (Mycology) Culture PENDING  Incomplete   Fungal Source BIOPSY  Final    Comment: RT NECK Performed at [facility], [place] [address], [place],   [address]   Fungus Culture Result     Status: None   Collection Time: 04/19/19 11:39 AM  Result Value Ref Range Status   Result 1 Comment  Final    Comment: (NOTE) KOH/Calcofluor preparation:  no fungus observed. Performed At: [facility] [place], [place] [id number] Rush Farmer MD [id number]          Radiology Studies: DG CHEST PORT 1 VIEW  Result Date: 04/26/2019 CLINICAL DATA:  Shortness of breath. EXAM: PORTABLE CHEST 1 VIEW COMPARISON:  04/20/2019. FINDINGS: Left lower lateral portion of the chest not imaged. Heart size stable. Prominent multifocal bilateral pulmonary infiltrates are again noted without significant interim change. Moderate right pleural effusion noted on today's exam. No pneumothorax. No acute bony abnormality. IMPRESSION: Prominent multifocal bilateral pulmonary infiltrates are again noted without significant interim change. Electronically Signed   By: Marcello Moores  Register   On: 04/26/2019 08:41        Scheduled Meds: . acyclovir ointment   Topical 4x daily  . apixaban  5 mg Oral BID  . vitamin C  500 mg Oral Daily  . atorvastatin  10 mg Oral Daily  . docusate sodium  200 mg Oral BID  . feeding supplement (ENSURE ENLIVE)  237 mL Oral BID BM  . fluticasone  2 spray Each Nare Daily  . insulin aspart  0-15 Units Subcutaneous TID WC  . insulin aspart  0-5 Units Subcutaneous QHS  . insulin aspart  6 Units Subcutaneous TID WC  . insulin glargine  8 Units Subcutaneous QHS  . Ipratropium-Albuterol  1 puff Inhalation Q6H  . latanoprost  1 drop Both Eyes QHS  . pantoprazole  40 mg Oral Daily  . predniSONE  3 mg Oral Daily  . valACYclovir  1,000 mg Oral BID  . zinc sulfate  220 mg Oral Daily   Continuous Infusions:  LOS: 22 days    Time spent: 34 minutes spent on chart review, discussion with nursing staff, consultants, updating family and interview/physical exam; more than 50% of that time was spent in counseling and/or  coordination of care.    Verbena Boeding J [place] DO [facility] Available via Epic secure chat 7am-7pm After these hours, please refer to coverage provider listed on [email or web address] 04/27/2019, 1:52 PM

## 2019-04-27 NOTE — Progress Notes (Signed)
Due to his elevated CBGs, ok to increase meal coverage to 6 units and lantus to 8 units.   Onnie Boer, PharmD, BCIDP, AAHIVP, CPP Infectious Disease Pharmacist 04/27/2019 12:20 PM

## 2019-04-27 NOTE — Progress Notes (Signed)
Results for SHARIQ, DEPOLO (MRN [id number]) as of 04/27/2019 12:01  Ref. Range 04/26/2019 11:02 04/26/2019 16:34 04/26/2019 19:25 04/27/2019 07:55 04/27/2019 11:26  Glucose-Capillary Latest Ref Range: 70 - 99 mg/dL 202 (H) 241 (H) 221 (H) 138 (H) 313 (H)  Noted that postprandial blood sugars continue to be greater than 200 mg/dl.  Recommend increasing Novolog meal coverage to 6 units TID if blood sugars continue to be elevated. May need to titrate other dosages as needed.  Harvel Ricks RN BSN CDE Diabetes Coordinator Pager: [phone]  8am-5pm

## 2019-04-28 LAB — GLUCOSE, CAPILLARY
Glucose-Capillary: 134 mg/dL — ABNORMAL HIGH (ref 70–99)
Glucose-Capillary: 143 mg/dL — ABNORMAL HIGH (ref 70–99)
Glucose-Capillary: 74 mg/dL (ref 70–99)
Glucose-Capillary: 295 mg/dL — ABNORMAL HIGH (ref 70–99)
Glucose-Capillary: 103 mg/dL — ABNORMAL HIGH (ref 70–99)

## 2019-04-28 MED ORDER — FUROSEMIDE 10 MG/ML IJ SOLN
40.0000 mg | Freq: Two times a day (BID) | INTRAMUSCULAR | Status: AC
  Administered 2019-04-28 (×2): 40 mg via INTRAVENOUS
  Filled 2019-04-28 (×2): qty 4

## 2019-04-28 NOTE — Progress Notes (Signed)
SATURATION QUALIFICATIONS: (This note is used to comply with regulatory documentation for home oxygen)  Patient Saturations on Room Air at Rest = unable  Patient Saturations on Room Air while Ambulating = unable  Patient Saturations on 4 Liters of oxygen while Ambulating = 84%  Please briefly explain why patient needs home oxygen: Pt was unable to ambulate on room air, he is very anxious and even on 4L/min was insistent sats will "tank". On 4L/min ambulated approx 63ft w/ RW and SBA and desat to min of 84% with seated rest and pursed lip breathing able to recover to 92% within 1 min.  Horald Chestnut, PT

## 2019-04-28 NOTE — Progress Notes (Signed)
PROGRESS NOTE    Dustin Diaz  [id number] DOB: February 27, 1946 DOA: 04/05/2019 PCP: Lavone Orn, MD    Brief Narrative:   Dustin Diaz is a 74 y.o. male with past medical history remarkable for of DM-2, HTN, PMR (on chronic steroids), HLD,  who presented to the hospital on 1/14 with shortness of breath-was found to have acute hypoxic respiratory failure secondary to COVID-19 pneumonia.  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   Assessment & Plan:   Principal Problem:   Pneumonia due to COVID-19 virus Active Problems:   Hypertension   Diabetes ([place])   High cholesterol   Polymyalgia rheumatica ([facility])   Acute hypoxic respiratory failure secondary to acute Covid-19 viral pneumonia during the ongoing Covid 19 Pandemic - POA Patient presenting to ED with progressive shortness of breath and found to be hypoxic secondary to Covid-19 pneumonia.  Initially CRP elevated at 36.4 with a D-dimer of 1.85. Received 2 doses of Actemra on 1/15 and 1/16.  Completed 5-day course of IV remdesivir on 1/18.  Initially started on IV Decadron which has now been weaned back to his home prednisone 3 mg p.o. daily.  Continues with hypoxia at rest on 2L [place], which is extensively worsened with ambulation, with drop in SPO2 to 84% on 4 L nasal cannula ambulating approximately 32 feet with RW today with PT. --Continue home prednisone 3 mg p.o. daily --Continue supplemental oxygen, titrate to maintain SPO2 greater than 92% --Continue supportive care with vitamin C, zinc, Tylenol, Combivent MDI --Continue airborne/contact isolation precautions --repeat Lasix 40mg  IV BID today --Discussed with RN, repeat walking trial to see how much supplemental oxygen he requires to maintain SPO2 greater than 90% with exertion  HSV 1 infection/skin lesions Patient developed nodular cavitary skin lesions to  his neck, lip, and occipital area during hospitalization.  Initially thought secondary to fungal infection.  Underwent punch biopsy by general surgery with biopsy notable for HSV 1 infection.  Infectious disease was consulted and recommended initiation of IV acyclovir.  Given the lesions are now crusted over,  transitioned to oral valacyclovir 1000 mg p.o. twice daily to complete a total course of 14 days.  Left lower extremity DVT: CTA chest on 04/09/2019 negative for pulmonary embolism. --Continue Eliquis  Type 2 diabetes mellitus Hemoglobin A1c 7.1 on 04/06/2019, well controlled.  On Glimepiride 2 mg p.o. daily, Jardiance 25 mg p.o. daily, Metformin 2000 mg every morning, pioglitazone 45 mg p.o. daily at home. --Holding home hypoglycemic regimen while inpatient --Continue Lantus 8 units subcutaneously daily --Novolog 6u [place] TIDAC --Insulin scale for further coverage  Essential hypertension On amlodipine-valsartan 5-160 mg p.o. daily at home.  Patient with episodes of dizziness, and is antihypertensives were discontinued.  BP 120/70 today, fairly well controlled. --Continue monitoring BP off of his home antihypertensives  Dyslipidemia: Continue statin  History of PMR --Continue prednisone 3 mg p.o. daily (home dose)  Right hilar lymphadenopathy Likely reactive secondary to acute infectious process with Covid-19 pneumonia versus HSV-1 infection. 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  To be coordinated by PCP.   DVT prophylaxis: Eliquis Code Status: Full code Family Communication: Updated patient extensively at bedside Disposition Plan: From home with spouse, currently on 2 L nasal cannula at rest but with significant hypoxia with minimal ambulation, down to 84%  on 4 L nasal cannula.  Currently unsafe discharge home at this time until  exertional FiO2 requirements improved.  Anticipate discharge back home with home health when FiO2 requirements are more reasonable.   Consultants:   General surgery  Infectious disease  Procedures:   Punch biopsy right neck lesion by general surgery on 1/28 2021  Antimicrobials:   Acyclovir 2/1>> Amphotericin B 1/27 - 2/1 Cefepime 1/26 - 1/27 Vancomycin 1/26 - 1/27 Ceftriaxone 1/15 - 1/15 Remdesivir 1/14 - 1/18 Actemra 1/15 and 1/16    Subjective: Patient seen and examined at bedside, resting comfortably.  Continues on 2 L nasal cannula, but becomes significantly hypoxic while ambulating with PT today.  Continues with frustration regarding length of stay, but understands given his severe dyspnea and hypoxia on ambulation.  No other complaints at this time.  No other acute concerns overnight by nursing staff.  Objective: Vitals:   04/27/19 1900 04/27/19 2000 04/28/19 0341 04/28/19 0756  BP:  119/68 (!) 131/56 123/66  Pulse:  90 74 73  Resp:  20 18 20   Temp: 98 F (36.7 C) 98.8 F (37.1 C) 98.3 F (36.8 C) 98.3 F (36.8 C)  TempSrc: Oral Oral Axillary Oral  SpO2:  94% 91% 94%  Weight:      Height:        Intake/Output Summary (Last 24 hours) at 04/28/2019 1155 Last data filed at 04/28/2019 0600 Gross per 24 hour  Intake 250 ml  Output 550 ml  Net -300 ml   Filed Weights   04/06/19 0145  Weight: 78 kg    Examination:  General exam: Appears calm and comfortable  Respiratory system: Crackles bilateral bases with mild mid inspiratory wheezing bilateral upper lobes, Respiratory effort normal.  On 2 L nasal cannula at rest and continues with significant hypoxia with minimal exertion Cardiovascular system: S1 & S2 heard, RRR. No JVD, murmurs, rubs, gallops or clicks. No pedal edema. Gastrointestinal system: Abdomen is nondistended, soft and nontender. No organomegaly or masses felt. Normal bowel sounds heard. Central nervous system: Alert and oriented. No focal  neurological deficits. Extremities: Symmetric 5 x 5 power. Skin: No rashes, lesions or ulcers Psychiatry: Judgement and insight appear normal. Mood & affect appropriate.     Data Reviewed: I have personally reviewed following labs and imaging studies  CBC: Recent Labs  Lab 04/22/19 0155 04/23/19 0348 04/24/19 0535 04/25/19 0447 04/27/19 0421  WBC 10.7* 10.3 8.4 8.8 8.5  NEUTROABS 7.7 6.9 5.6 5.7  --   HGB 13.9 13.9 13.3 12.8* 12.7*  HCT 41.6 41.6 40.1 38.8* 39.7  MCV 90.6 91.6 91.3 91.7 93.2  PLT 241 220 211 203 [id number]   Basic Metabolic Panel: Recent Labs  Lab 04/22/19 0155 04/23/19 0348 04/24/19 0535 04/25/19 0447 04/27/19 0421  NA 135 134* 136 136 138  K 3.9 4.5 3.8 4.1 4.5  CL 99 99 101 99 101  CO2 27 27 27 28 30   GLUCOSE 119* 115* 134* 142* 150*  BUN 23 29* 25* 27* 30*  CREATININE 0.86 1.16 1.12 1.01 1.12  CALCIUM 8.4* 8.5* 8.3* 8.5* 8.5*  MG 2.3 2.3 2.1 2.2  --    GFR: Estimated Creatinine Clearance: 61.6 mL/min (by C-G formula based on SCr of 1.12 mg/dL). Liver Function Tests: Recent Labs  Lab 04/22/19 0155 04/23/19 0348 04/24/19 0535 04/25/19 0447  AST 20 18 19 19   ALT 19 18 16 16   ALKPHOS 68 65 57 61  BILITOT 1.2 1.6* 1.1 1.2  PROT 5.8* 5.9*  5.7* 5.8*  ALBUMIN 3.1* 3.3* 3.1* 3.3*   No results for input(s): LIPASE, AMYLASE in the last 168 hours. No results for input(s): AMMONIA in the last 168 hours. Coagulation Profile: No results for input(s): INR, PROTIME in the last 168 hours. Cardiac Enzymes: No results for input(s): CKTOTAL, CKMB, CKMBINDEX, TROPONINI in the last 168 hours. BNP (last 3 results) No results for input(s): PROBNP in the last 8760 hours. HbA1C: No results for input(s): HGBA1C in the last 72 hours. CBG: Recent Labs  Lab 04/27/19 1255 04/27/19 1417 04/27/19 1611 04/27/19 2013 04/28/19 0754  GLUCAP 164* 138* 134* 302* 143*   Lipid Profile: No results for input(s): CHOL, HDL, LDLCALC, TRIG, CHOLHDL, LDLDIRECT in the last  72 hours. Thyroid Function Tests: No results for input(s): TSH, T4TOTAL, FREET4, T3FREE, THYROIDAB in the last 72 hours. Anemia Panel: Recent Labs    04/27/19 0421  FERRITIN 247   Sepsis Labs: No results for input(s): PROCALCITON, LATICACIDVEN in the last 168 hours.  Recent Results (from the past 240 hour(s))  Fungus culture, blood     Status: None   Collection Time: 04/18/19  3:00 PM   Specimen: BLOOD  Result Value Ref Range Status   Specimen Description   Final    BLOOD RIGHT ANTECUBITAL Performed at [facility] [address], [place], [place] [address]    Special Requests   Final    BOTTLES DRAWN AEROBIC ONLY Blood Culture adequate volume Performed at [facility] [address] [place], [place] [address]    Culture   Final    NO GROWTH 7 DAYS NO FUNGUS ISOLATED Performed at [facility], [place] [address] [place], [place] [address]    Report Status 04/25/2019 FINAL  Final  Fungus culture, blood     Status: None   Collection Time: 04/18/19  3:05 PM   Specimen: BLOOD RIGHT HAND  Result Value Ref Range Status   Specimen Description   Final    BLOOD RIGHT HAND Performed at [facility] [address], [place], [place] [address]    Special Requests   Final    BOTTLES DRAWN AEROBIC ONLY Blood Culture adequate volume Performed at [facility] [address] [place], [place] [address]    Culture   Final    NO GROWTH 7 DAYS NO FUNGUS ISOLATED Performed at [facility], [place] [address], [place], [place] [address]    Report Status 04/25/2019 FINAL  Final  Aerobic/Anaerobic Culture (surgical/deep wound)     Status: None   Collection Time: 04/19/19 11:39 AM   Specimen: Biopsy; Tissue  Result Value Ref Range Status   Specimen Description   Final    BIOPSY RT NECK Performed at [facility] [address], [place], [place] [address]    Special Requests   Final     Immunocompromised Performed at [facility], [place] [address], [place], [place] [address]    Gram Stain   Final    NO WBC SEEN NO ORGANISMS SEEN Performed at [facility], [place] [address] [place], [place] [address]    Culture No growth aerobically or anaerobically.  Final   Report Status 04/24/2019 FINAL  Final  Acid Fast Smear (AFB)     Status: None   Collection Time: 04/19/19 11:39 AM   Specimen: Neck, Right  Result Value Ref Range Status   AFB Specimen Processing Concentration  Final   Acid  Fast Smear Negative  Final    Comment: (NOTE) Performed At: [facility] [place], [place] [id number] Rush Farmer MD [id number]    Source (AFB) BIOPSY  Final    Comment: RT NECK Performed at [facility], [place] [address] [place], [place] [address]   Fungus Culture With Stain     Status: None (Preliminary result)   Collection Time: 04/19/19 11:39 AM   Specimen: Neck, Right  Result Value Ref Range Status   Fungus Stain Final report  Final    Comment: (NOTE) Performed At: [facility], [place], [place] [id number] Rush Farmer MD [id number]    Fungus (Mycology) Culture PENDING  Incomplete   Fungal Source BIOPSY  Final    Comment: RT NECK Performed at [facility], [place] [address], [place], [place] [address]   Fungus Culture Result     Status: None   Collection Time: 04/19/19 11:39 AM  Result Value Ref Range Status   Result 1 Comment  Final    Comment: (NOTE) KOH/Calcofluor preparation:  no fungus observed. Performed At: [facility] [place], [place] [id number] Rush Farmer MD [id number]          Radiology Studies: No results found.      Scheduled Meds: . acyclovir ointment   Topical 4x daily  . apixaban  5 mg Oral BID  . vitamin C  500 mg Oral Daily  . atorvastatin  10 mg Oral Daily  . docusate sodium  200 mg Oral BID  .  feeding supplement (ENSURE ENLIVE)  237 mL Oral BID BM  . fluticasone  2 spray Each Nare Daily  . furosemide  40 mg Intravenous Q12H  . insulin aspart  0-15 Units Subcutaneous TID WC  . insulin aspart  0-5 Units Subcutaneous QHS  . insulin aspart  6 Units Subcutaneous TID WC  . insulin glargine  8 Units Subcutaneous QHS  . Ipratropium-Albuterol  1 puff Inhalation Q6H  . latanoprost  1 drop Both Eyes QHS  . pantoprazole  40 mg Oral Daily  . predniSONE  3 mg Oral Daily  . valACYclovir  1,000 mg Oral BID  . zinc sulfate  220 mg Oral Daily   Continuous Infusions:    LOS: 23 days    Time spent: 34 minutes spent on chart review, discussion with nursing staff, consultants, updating family and interview/physical exam; more than 50% of that time was spent in counseling and/or coordination of care.    Marixa Mellott J [place] DO [facility] Available via Epic secure chat 7am-7pm After these hours, please refer to coverage provider listed on [email or web address] 04/28/2019, 11:55 AM

## 2019-04-28 NOTE — Progress Notes (Signed)
RN called pt.'s wife for daily updates. No further concerns.

## 2019-04-28 NOTE — Progress Notes (Signed)
Pt still refusing to walk in hallway with nurse. States is very tired. Pt wants to go back to bed after dinner. Refused shower also.

## 2019-04-28 NOTE — Progress Notes (Signed)
Physical Therapy Treatment Patient Details Name: Dustin Diaz MRN: [id number] DOB: [birth year] Today's Date: 04/28/2019    History of Present Illness 74 year old male admitted with Covid /Pneumonia. PMH includes DM, HTN, hyperlipidemia, polymyalgia rheumatica. herpes virus on lip and neck    PT Comments    Pt did well with tx this am, was less anxious and able to complete tasks given. Pt insists that with any movement will desat, at rest on 2L/min sats in low 90s. Ambulated approx 74ft in room with RW and SBA did well with 02 sats (on 4L/min) until sat down and sats dropped to min 84%, Pt able to recover quickly to 92% within 1 min. Completed seated therapeutic exercises in recliner on 2L/min with min desat being 89%    Follow Up Recommendations  Home health PT;Supervision - Intermittent;Supervision for mobility/OOB     Equipment Recommendations  Rolling walker with 5" wheels    Recommendations for Other Services       Precautions / Restrictions Precautions Precautions: Fall;Other (comment) Precaution Comments: monitor O2 Sats, and BP Restrictions Weight Bearing Restrictions: No    Mobility  Bed Mobility               General bed mobility comments: Up in chair  Transfers Overall transfer level: Needs assistance Equipment used: Rolling walker (2 wheeled) Transfers: Sit to/from Stand Sit to Stand: Supervision            Ambulation/Gait Ambulation/Gait assistance: Supervision Gait Distance (Feet): 32 Feet Assistive device: Rolling walker (2 wheeled) Gait Pattern/deviations: Step-through pattern Gait velocity: decr   General Gait Details: ambulated on 4L/min via [place] min sat 84% able to recover within 60min to 92%   Stairs             Wheelchair Mobility    Modified Rankin (Stroke Patients Only)       Balance Overall balance assessment: Needs assistance Sitting-balance support: Feet supported Sitting balance-Leahy Scale: Good      Standing balance support: During functional activity;Bilateral upper extremity supported Standing balance-Leahy Scale: Fair                              Cognition Arousal/Alertness: Awake/alert Behavior During Therapy: Flat affect;Anxious Overall Cognitive Status: Within Functional Limits for tasks assessed                                 General Comments: did not seem too anxious today, used some distraction      Exercises General Exercises - Lower Extremity Ankle Circles/Pumps: AROM;Strengthening;Both;10 reps Long Arc Quad: AROM;Strengthening;Both;10 reps Straight Leg Raises: AROM;Strengthening;Both;10 reps    General Comments        Pertinent Vitals/Pain Pain Assessment: Faces Faces Pain Scale: Hurts little more Pain Location: mouth. lips, neck sores, w/ inceased work of breathing Pain Descriptors / Indicators: Burning;Constant;Discomfort Pain Intervention(s): Limited activity within patient's tolerance;Monitored during session    Home Living                      Prior Function            PT Goals (current goals can now be found in the care plan section) Acute Rehab PT Goals Patient Stated Goal: Go back home to see family PT Goal Formulation: With patient Time For Goal Achievement: 05/08/19 Potential to Achieve Goals: Fair Progress towards PT  goals: (inconsistent with progress)    Frequency    Min 3X/week      PT Plan Current plan remains appropriate    Co-evaluation              AM-PAC PT "6 Clicks" Mobility   Outcome Measure  Help needed turning from your back to your side while in a flat bed without using bedrails?: None Help needed moving from lying on your back to sitting on the side of a flat bed without using bedrails?: None Help needed moving to and from a bed to a chair (including a wheelchair)?: A Little Help needed standing up from a chair using your arms (e.g., wheelchair or bedside chair)?: A  Little Help needed to walk in hospital room?: A Little Help needed climbing 3-5 steps with a railing? : A Lot 6 Click Score: 19    End of Session Equipment Utilized During Treatment: Oxygen Activity Tolerance: Patient limited by fatigue;Patient limited by lethargy;Treatment limited secondary to medical complications (Comment) Patient left: in chair;with call bell/phone within reach Nurse Communication: Mobility status PT Visit Diagnosis: Unsteadiness on feet (R26.81);Other abnormalities of gait and mobility (R26.89);Muscle weakness (generalized) (M62.81)     Time: [id number] PT Time Calculation (min) (ACUTE ONLY): 23 min  Charges:  $Gait Training: 8-22 mins $Therapeutic Exercise: 8-22 mins                     Horald Chestnut, PT    Delford Field 04/28/2019, 10:02 AM

## 2019-04-29 LAB — GLUCOSE, CAPILLARY
Glucose-Capillary: 124 mg/dL — ABNORMAL HIGH (ref 70–99)
Glucose-Capillary: 196 mg/dL — ABNORMAL HIGH (ref 70–99)
Glucose-Capillary: 249 mg/dL — ABNORMAL HIGH (ref 70–99)
Glucose-Capillary: 181 mg/dL — ABNORMAL HIGH (ref 70–99)

## 2019-04-29 LAB — BASIC METABOLIC PANEL
Anion gap: 10 (ref 5–15)
BUN: 26 mg/dL — ABNORMAL HIGH (ref 8–23)
CO2: 30 mmol/L (ref 22–32)
Calcium: 8.8 mg/dL — ABNORMAL LOW (ref 8.9–10.3)
Chloride: 97 mmol/L — ABNORMAL LOW (ref 98–111)
Creatinine, Ser: 0.91 mg/dL (ref 0.61–1.24)
GFR calc Af Amer: >60 mL/min (ref >60)
GFR calc non Af Amer: >60 mL/min (ref >60)
Glucose, Bld: 131 mg/dL — ABNORMAL HIGH (ref 70–99)
Potassium: 3.6 mmol/L (ref 3.5–5.1)
Sodium: 137 mmol/L (ref 135–145)

## 2019-04-29 MED ORDER — FUROSEMIDE 10 MG/ML IJ SOLN
40.0000 mg | Freq: Two times a day (BID) | INTRAMUSCULAR | Status: AC
  Administered 2019-04-29 (×2): 40 mg via INTRAVENOUS
  Filled 2019-04-29 (×2): qty 4

## 2019-04-29 MED ORDER — LORATADINE 10 MG PO TABS
10.0000 mg | ORAL_TABLET | Freq: Every day | ORAL | Status: DC
  Administered 2019-04-29 – 2019-05-03 (×5): 10 mg via ORAL
  Filled 2019-04-29 (×5): qty 1

## 2019-04-29 NOTE — Progress Notes (Signed)
Called wife to give daily update and got VM, left message that we were just calling to give update and adv she can call back if she would like.

## 2019-04-29 NOTE — Progress Notes (Signed)
Pt ambulated to bathroom for bath, pt SOB when reached the bathroom, RN increased O2 to 8L for 2 min then back down to 2L. Pt assisting with his bath and became SOB very quickly. RN completed bath, changed gown, socks, and bed linen. Pt ambulated back to bed with walker and was 79% when he sat back in bed. RN increased O2 back to 8L and it took approx 4-5 min for pt O2 sat to reach 92%. RN put O2 back on 2L.

## 2019-04-29 NOTE — Progress Notes (Signed)
RN in to walk pt, pt sat up on edge of bed and started breathing very rapidly and shallow. RN asked pt to try to slow breathing down and he said "honey if I could I would" and continued rapid breathing. O2 sats dropped to 80%. RN adv pt we cannot walk safely at this rate and he immediately started breathing slower as he laid back down in bed. RN adv MD of situation. Pt breathing normal while laying in bed and says he can breath normally in chair in semi reclined position as well but cannot sit straight up for some reason. RN adv pt we will try again later today and maybe see if anxiety meds help to keep him calm enough to walk.

## 2019-04-29 NOTE — Progress Notes (Signed)
PROGRESS NOTE    Dustin Diaz  [id number] DOB: 1945/04/11 DOA: 04/05/2019 PCP: Lavone Orn, MD    Brief Narrative:   Dustin Diaz is a 74 y.o. male with past medical history remarkable for of DM-2, HTN, PMR (on chronic steroids), HLD,  who presented to the hospital on 1/14 with shortness of breath-was found to have acute hypoxic respiratory failure secondary to COVID-19 pneumonia.  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   Assessment & Plan:   Principal Problem:   Pneumonia due to COVID-19 virus Active Problems:   Hypertension   Diabetes ([place])   High cholesterol   Polymyalgia rheumatica (HCC)   Acute hypoxic respiratory failure secondary to acute Covid-19 viral pneumonia during the ongoing Covid 19 Pandemic - POA Patient presenting to ED with progressive shortness of breath and found to be hypoxic secondary to Covid-19 pneumonia.  Initially CRP elevated at 36.4 with a D-dimer of 1.85. Received 2 doses of Actemra on 1/15 and 1/16.  Completed 5-day course of IV remdesivir on 1/18.  Initially started on IV Decadron which has now been weaned back to his home prednisone 3 mg p.o. daily.  Continues with hypoxia at rest on 2L [place], which is extensively worsened with ambulation, with drop in SPO2 to 84% on 4 L nasal cannula ambulating approximately 32 feet with RW yesterday with PT. --Continue home prednisone 3 mg p.o. daily --Continue supplemental oxygen, titrate to maintain SPO2 greater than 92% --Continue supportive care with vitamin C, zinc, Tylenol, Combivent MDI --Continue airborne/contact isolation precautions --repeat Lasix 40mg  IV BID today --Discussed with RN, repeat walking trial today to eval how much supplemental oxygen he requires to maintain SPO2 greater than 90% with exertion  HSV 1 infection/skin lesions Patient developed nodular cavitary skin  lesions to his neck, lip, and occipital area during hospitalization.  Initially thought secondary to fungal infection.  Underwent punch biopsy by general surgery with biopsy notable for HSV 1 infection.  Infectious disease was consulted and recommended initiation of IV acyclovir.  Given the lesions are now crusted over,  transitioned to oral valacyclovir 1000 mg p.o. twice daily to complete a total course of 14 days.  Left lower extremity DVT: CTA chest on 04/09/2019 negative for pulmonary embolism. --Continue Eliquis  Type 2 diabetes mellitus Hemoglobin A1c 7.1 on 04/06/2019, well controlled.  On Glimepiride 2 mg p.o. daily, Jardiance 25 mg p.o. daily, Metformin 2000 mg every morning, pioglitazone 45 mg p.o. daily at home. --Holding home hypoglycemic regimen while inpatient --Continue Lantus 8 units subcutaneously daily --Novolog 6u [place] TIDAC --Insulin scale for further coverage  Essential hypertension On amlodipine-valsartan 5-160 mg p.o. daily at home.  Patient with episodes of dizziness, and is antihypertensives were discontinued.  BP 126/65 today, fairly well controlled. --Continue monitoring BP off of his home antihypertensives  Dyslipidemia: Continue statin  History of PMR --Continue prednisone 3 mg p.o. daily (home dose)  Right hilar lymphadenopathy Likely reactive secondary to acute infectious process with Covid-19 pneumonia versus HSV-1 infection. 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  To be coordinated by PCP.  Congestion --Continue Flonase and saline nasal spray --Start Claritin 10 mg p.o. daily   DVT prophylaxis: Eliquis Code Status: Full code Family Communication: Updated patient extensively at bedside Disposition Plan: From home with spouse, currently on 2  L nasal cannula at rest but with significant hypoxia with  minimal ambulation, down to 84% on 4 L nasal cannula.  Currently unsafe discharge home at this time until exertional FiO2 requirements improved.  Anticipate discharge back home with home health when FiO2 requirements are more reasonable.   Consultants:   General surgery  Infectious disease  Procedures:   Punch biopsy right neck lesion by general surgery on 1/28 2021  Antimicrobials:   Acyclovir 2/1>> Amphotericin B 1/27 - 2/1 Cefepime 1/26 - 1/27 Vancomycin 1/26 - 1/27 Ceftriaxone 1/15 - 1/15 Remdesivir 1/14 - 1/18 Actemra 1/15 and 1/16    Subjective: Patient seen and examined at bedside, resting comfortably.  Continues on 2 L nasal cannula, but becomes significantly hypoxic while ambulating, and significance increasing respiratory rates just sitting at edge of bed today.  No other complaints at this time.  No other acute concerns overnight by nursing staff.  Objective: Vitals:   04/28/19 1550 04/28/19 1956 04/29/19 0550 04/29/19 0748  BP: 116/65 (!) 113/59 126/65 132/72  Pulse: 86 80 84 78  Resp: 18 20 20 18   Temp: 98 F (36.7 C) 98.4 F (36.9 C) 97.7 F (36.5 C) 98.2 F (36.8 C)  TempSrc: Oral Oral Oral Oral  SpO2: 99% 90% 92% 92%  Weight:      Height:        Intake/Output Summary (Last 24 hours) at 04/29/2019 1420 Last data filed at 04/29/2019 1254 Gross per 24 hour  Intake 1280 ml  Output 2075 ml  Net -795 ml   Filed Weights   04/06/19 0145  Weight: 78 kg    Examination:  General exam: Appears calm and comfortable  Respiratory system: Crackles bilateral bases with mild mid inspiratory wheezing bilateral upper lobes, Respiratory effort normal.  On 2 L nasal cannula at rest and continues with significant hypoxia and increased respiratory rate with minimal exertion Cardiovascular system: S1 & S2 heard, RRR. No JVD, murmurs, rubs, gallops or clicks. No pedal edema. Gastrointestinal system: Abdomen is nondistended, soft and nontender. No organomegaly or masses  felt. Normal bowel sounds heard. Central nervous system: Alert and oriented. No focal neurological deficits. Extremities: Symmetric 5 x 5 power. Skin: No rashes, lesions or ulcers Psychiatry: Judgement and insight appear normal. Mood & affect appropriate.     Data Reviewed: I have personally reviewed following labs and imaging studies  CBC: Recent Labs  Lab 04/23/19 0348 04/24/19 0535 04/25/19 0447 04/27/19 0421  WBC 10.3 8.4 8.8 8.5  NEUTROABS 6.9 5.6 5.7  --   HGB 13.9 13.3 12.8* 12.7*  HCT 41.6 40.1 38.8* 39.7  MCV 91.6 91.3 91.7 93.2  PLT 220 211 203 [id number]   Basic Metabolic Panel: Recent Labs  Lab 04/23/19 0348 04/24/19 0535 04/25/19 0447 04/27/19 0421 04/29/19 0535  NA 134* 136 136 138 137  K 4.5 3.8 4.1 4.5 3.6  CL 99 101 99 101 97*  CO2 27 27 28 30 30   GLUCOSE 115* 134* 142* 150* 131*  BUN 29* 25* 27* 30* 26*  CREATININE 1.16 1.12 1.01 1.12 0.91  CALCIUM 8.5* 8.3* 8.5* 8.5* 8.8*  MG 2.3 2.1 2.2  --   --    GFR: Estimated Creatinine Clearance: 75.8 mL/min (by C-G formula based on SCr of 0.91 mg/dL). Liver Function Tests: Recent Labs  Lab 04/23/19 0348 04/24/19 0535 04/25/19 0447  AST 18 19 19   ALT 18 16 16   ALKPHOS 65 57 61  BILITOT 1.6* 1.1 1.2  PROT 5.9* 5.7* 5.8*  ALBUMIN 3.3* 3.1* 3.3*   No results for input(s): LIPASE, AMYLASE in the last 168 hours. No results for input(s): AMMONIA in the last 168 hours. Coagulation Profile: No results for input(s): INR, PROTIME in the last 168 hours. Cardiac Enzymes: No results for input(s): CKTOTAL, CKMB, CKMBINDEX, TROPONINI in the last 168 hours. BNP (last 3 results) No results for input(s): PROBNP in the last 8760 hours. HbA1C: No results for input(s): HGBA1C in the last 72 hours. CBG: Recent Labs  Lab 04/28/19 1149 04/28/19 1626 04/28/19 2212 04/29/19 0746 04/29/19 1158  GLUCAP 74 295* 103* 124* 196*   Lipid Profile: No results for input(s): CHOL, HDL, LDLCALC, TRIG, CHOLHDL, LDLDIRECT in  the last 72 hours. Thyroid Function Tests: No results for input(s): TSH, T4TOTAL, FREET4, T3FREE, THYROIDAB in the last 72 hours. Anemia Panel: Recent Labs    04/27/19 0421  FERRITIN 247   Sepsis Labs: No results for input(s): PROCALCITON, LATICACIDVEN in the last 168 hours.  No results found for this or any previous visit (from the past 240 hour(s)).       Radiology Studies: No results found.      Scheduled Meds: . acyclovir ointment   Topical 4x daily  . apixaban  5 mg Oral BID  . vitamin C  500 mg Oral Daily  . atorvastatin  10 mg Oral Daily  . docusate sodium  200 mg Oral BID  . feeding supplement (ENSURE ENLIVE)  237 mL Oral BID BM  . fluticasone  2 spray Each Nare Daily  . furosemide  40 mg Intravenous Q12H  . insulin aspart  0-15 Units Subcutaneous TID WC  . insulin aspart  0-5 Units Subcutaneous QHS  . insulin aspart  6 Units Subcutaneous TID WC  . insulin glargine  8 Units Subcutaneous QHS  . Ipratropium-Albuterol  1 puff Inhalation Q6H  . latanoprost  1 drop Both Eyes QHS  . loratadine  10 mg Oral Daily  . pantoprazole  40 mg Oral Daily  . predniSONE  3 mg Oral Daily  . valACYclovir  1,000 mg Oral BID  . zinc sulfate  220 mg Oral Daily   Continuous Infusions:    LOS: 24 days    Time spent: 34 minutes spent on chart review, discussion with nursing staff, consultants, updating family and interview/physical exam; more than 50% of that time was spent in counseling and/or coordination of care.    Jonel Weldon J [place] DO [facility] Available via Epic secure chat 7am-7pm After these hours, please refer to coverage provider listed on [email or web address] 04/29/2019, 2:20 PM

## 2019-04-30 LAB — GLUCOSE, CAPILLARY: Glucose-Capillary: 150 mg/dL — ABNORMAL HIGH (ref 70–99)

## 2019-04-30 MED ORDER — OXYMETAZOLINE HCL 0.05 % NA SOLN
1.0000 | Freq: Two times a day (BID) | NASAL | Status: AC
  Administered 2019-04-30 – 2019-05-02 (×5): 1 via NASAL
  Filled 2019-04-30: qty 15

## 2019-04-30 NOTE — Progress Notes (Signed)
PROGRESS NOTE    Dustin Diaz  [id number] DOB: [birth year] DOA: 04/05/2019 PCP: Lavone Orn, MD    Brief Narrative:   Dustin Diaz is a 74 y.o. male with past medical history remarkable for of DM-2, HTN, PMR (on chronic steroids), HLD,  who presented to the hospital on 1/14 with shortness of breath-was found to have acute hypoxic respiratory failure secondary to COVID-19 pneumonia.  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   Assessment & Plan:   Principal Problem:   Pneumonia due to COVID-19 virus Active Problems:   Hypertension   Diabetes ([place])   High cholesterol   Polymyalgia rheumatica (HCC)   Acute hypoxic respiratory failure secondary to acute Covid-19 viral pneumonia during the ongoing Covid 19 Pandemic - POA Patient presenting to ED with progressive shortness of breath and found to be hypoxic secondary to Covid-19 pneumonia.  Initially CRP elevated at 36.4 with a D-dimer of 1.85. Received 2 doses of Actemra on 1/15 and 1/16.  Completed 5-day course of IV remdesivir on 1/18.  Initially started on IV Decadron which has now been weaned back to his home prednisone 3 mg p.o. daily.  Continues with hypoxia at rest on 2L , which is extensively worsened with ambulation, with drop in SPO2 to 79% on 8 L nasal cannula while taking a bath yesterday; taking roughly 4-5 minutes for patient O2 sat to recover.. --Continue home prednisone 3 mg p.o. daily --Continue supplemental oxygen, titrate to maintain SPO2 greater than 92% --Continue supportive care with vitamin C, zinc, Tylenol, Combivent MDI --Continue airborne/contact isolation precautions --Discussed with RN, repeat walking trial today to eval how much supplemental oxygen he requires to maintain SPO2 greater than 90% with exertion; patient refused to walk yesterday.  HSV 1 infection/skin lesions Patient  developed nodular cavitary skin lesions to his neck, lip, and occipital area during hospitalization.  Initially thought secondary to fungal infection.  Underwent punch biopsy by general surgery with biopsy notable for HSV 1 infection.  Infectious disease was consulted and recommended initiation of IV acyclovir.  Given the lesions are now crusted over,  transitioned to oral valacyclovir 1000 mg p.o. twice daily to complete a total course of 14 days.  Left lower extremity DVT: CTA chest on 04/09/2019 negative for pulmonary embolism. --Continue Eliquis  Type 2 diabetes mellitus Hemoglobin A1c 7.1 on 04/06/2019, well controlled.  On Glimepiride 2 mg p.o. daily, Jardiance 25 mg p.o. daily, Metformin 2000 mg every morning, pioglitazone 45 mg p.o. daily at home. --Holding home hypoglycemic regimen while inpatient --Continue Lantus 8 units subcutaneously daily --Novolog 6u [place] TIDAC --Insulin scale for further coverage  Essential hypertension On amlodipine-valsartan 5-160 mg p.o. daily at home.  Patient with episodes of dizziness, and is antihypertensives were discontinued.  BP 126/65 today, fairly well controlled. --Continue monitoring BP off of his home antihypertensives  Dyslipidemia: Continue statin  History of PMR --Continue prednisone 3 mg p.o. daily (home dose)  Right hilar lymphadenopathy Likely reactive secondary to acute infectious process with Covid-19 pneumonia versus HSV-1 infection. 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  To be coordinated by PCP.  Congestion --Continue Flonase and saline nasal spray --Start Claritin 10 mg p.o. daily   DVT prophylaxis: Eliquis Code Status: Full code Family Communication: Updated patient spouse, Velva Harman via telephone this morning Disposition  Plan: From home with spouse, currently on 2 L nasal  cannula at rest but with significant hypoxia with bathing yesterday, down to 79% on 8 L nasal cannula.  Currently unsafe discharge home at this time until exertional FiO2 requirements improved.  Anticipate discharge back home with home health when FiO2 requirements are more reasonable.   Consultants:   General surgery  Infectious disease  Procedures:   Punch biopsy right neck lesion by general surgery on 1/28 2021  Antimicrobials:   Acyclovir 2/1>> Amphotericin B 1/27 - 2/1 Cefepime 1/26 - 1/27 Vancomycin 1/26 - 1/27 Ceftriaxone 1/15 - 1/15 Remdesivir 1/14 - 1/18 Actemra 1/15 and 1/16    Subjective: Patient seen and examined at bedside, resting comfortably.  Continues on 2 L nasal cannula, but becomes significantly hypoxic, especially while bathing x-ray dropping his oxygen saturation to 79%, requiring 8 L nasal cannula with recovery of his sats in 4-5 minutes.  Updated patient's spouse via telephone this morning.  No other complaints at this time.  No other acute concerns overnight by nursing staff.  Objective: Vitals:   04/29/19 1657 04/29/19 2000 04/30/19 0400 04/30/19 0800  BP: 114/65 (!) 110/59 130/62 112/75  Pulse: 94 92    Resp: 18 19 18 20   Temp: 97.9 F (36.6 C) 98.5 F (36.9 C) 98.6 F (37 C) 97.8 F (36.6 C)  TempSrc: Oral Oral Oral Oral  SpO2: 93% 91% 92% 90%  Weight:      Height:        Intake/Output Summary (Last 24 hours) at 04/30/2019 1019 Last data filed at 04/29/2019 2000 Gross per 24 hour  Intake 360 ml  Output 950 ml  Net -590 ml   Filed Weights   04/06/19 0145  Weight: 78 kg    Examination:  General exam: Appears calm and comfortable  Respiratory system: Crackles bilateral bases with mild mid inspiratory wheezing bilateral upper lobes, Respiratory effort normal.  On 2 L nasal cannula at rest and continues with significant hypoxia and increased respiratory rate with minimal exertion Cardiovascular system: S1 & S2 heard, RRR. No JVD,  murmurs, rubs, gallops or clicks. No pedal edema. Gastrointestinal system: Abdomen is nondistended, soft and nontender. No organomegaly or masses felt. Normal bowel sounds heard. Central nervous system: Alert and oriented. No focal neurological deficits. Extremities: Symmetric 5 x 5 power. Skin: No rashes, lesions or ulcers Psychiatry: Judgement and insight appear normal. Mood & affect appropriate.     Data Reviewed: I have personally reviewed following labs and imaging studies  CBC: Recent Labs  Lab 04/24/19 0535 04/25/19 0447 04/27/19 0421  WBC 8.4 8.8 8.5  NEUTROABS 5.6 5.7  --   HGB 13.3 12.8* 12.7*  HCT 40.1 38.8* 39.7  MCV 91.3 91.7 93.2  PLT 211 203 123456   Basic Metabolic Panel: Recent Labs  Lab 04/24/19 0535 04/25/19 0447 04/27/19 0421 04/29/19 0535  NA 136 136 138 137  K 3.8 4.1 4.5 3.6  CL 101 99 101 97*  CO2 27 28 30 30   GLUCOSE 134* 142* 150* 131*  BUN 25* 27* 30* 26*  CREATININE 1.12 1.01 1.12 0.91  CALCIUM 8.3* 8.5* 8.5* 8.8*  MG 2.1 2.2  --   --    GFR: Estimated Creatinine Clearance: 75.8 mL/min (by C-G formula based on SCr of 0.91 mg/dL). Liver Function Tests: Recent Labs  Lab 04/24/19 0535 04/25/19 0447  AST 19 19  ALT 16 16  ALKPHOS 57 61  BILITOT 1.1 1.2  PROT 5.7* 5.8*  ALBUMIN  3.1* 3.3*   No results for input(s): LIPASE, AMYLASE in the last 168 hours. No results for input(s): AMMONIA in the last 168 hours. Coagulation Profile: No results for input(s): INR, PROTIME in the last 168 hours. Cardiac Enzymes: No results for input(s): CKTOTAL, CKMB, CKMBINDEX, TROPONINI in the last 168 hours. BNP (last 3 results) No results for input(s): PROBNP in the last 8760 hours. HbA1C: No results for input(s): HGBA1C in the last 72 hours. CBG: Recent Labs  Lab 04/29/19 0746 04/29/19 1158 04/29/19 1659 04/29/19 2016 04/30/19 0752  GLUCAP 124* 196* 249* 181* 150*   Lipid Profile: No results for input(s): CHOL, HDL, LDLCALC, TRIG, CHOLHDL,  LDLDIRECT in the last 72 hours. Thyroid Function Tests: No results for input(s): TSH, T4TOTAL, FREET4, T3FREE, THYROIDAB in the last 72 hours. Anemia Panel: No results for input(s): VITAMINB12, FOLATE, FERRITIN, TIBC, IRON, RETICCTPCT in the last 72 hours. Sepsis Labs: No results for input(s): PROCALCITON, LATICACIDVEN in the last 168 hours.  No results found for this or any previous visit (from the past 240 hour(s)).       Radiology Studies: No results found.      Scheduled Meds: . acyclovir ointment   Topical 4x daily  . apixaban  5 mg Oral BID  . vitamin C  500 mg Oral Daily  . atorvastatin  10 mg Oral Daily  . docusate sodium  200 mg Oral BID  . feeding supplement (ENSURE ENLIVE)  237 mL Oral BID BM  . fluticasone  2 spray Each Nare Daily  . insulin aspart  0-15 Units Subcutaneous TID WC  . insulin aspart  0-5 Units Subcutaneous QHS  . insulin aspart  6 Units Subcutaneous TID WC  . insulin glargine  8 Units Subcutaneous QHS  . Ipratropium-Albuterol  1 puff Inhalation Q6H  . latanoprost  1 drop Both Eyes QHS  . loratadine  10 mg Oral Daily  . pantoprazole  40 mg Oral Daily  . predniSONE  3 mg Oral Daily  . valACYclovir  1,000 mg Oral BID  . zinc sulfate  220 mg Oral Daily   Continuous Infusions:    LOS: 25 days    Time spent: 34 minutes spent on chart review, discussion with nursing staff, consultants, updating family and interview/physical exam; more than 50% of that time was spent in counseling and/or coordination of care.    Eiko Mcgowen J [place] DO [facility] Available via Epic secure chat 7am-7pm After these hours, please refer to coverage provider listed on [email or web address] 04/30/2019, 10:19 AM

## 2019-04-30 NOTE — Plan of Care (Signed)

## 2019-04-30 NOTE — Progress Notes (Signed)
Occupational Therapy Treatment Patient Details Name: Dustin Diaz MRN: [id number] DOB: [birth year] Today's Date: 04/30/2019    History of present illness 74 year old male admitted with Covid /Pneumonia. PMH includes DM, HTN, hyperlipidemia, polymyalgia rheumatica. herpes virus on lip and neck   OT comments  All goals reviewed and updated. Pt continues to make slow progress in therapy requiring encouragement to participate in out of bed tasks as well as therapy tasks. Per pt, he has not been out of bed all day. When therapist asked pt why, pt responded "I was in that chair all day yesterday." Continued to provided education regarding importance of daily activity and sitting upright in the chair throughout the day. Pt tolerated standing 1 x 4.5 min at the sink to complete grooming tasks. SpO2 dropped to 74% on 2L with pt requiring 4-5 min seated recovery to return to 90%. Pt engaged in breathing exercises with SpO2 dropping to 84% and pt requiring 2-3 min seated recovery to return to 90%. Pt tolerated standing additional 1 x 1.5 min while engaging in standing ther ex with SpO2 dropping to 74%. Pt required additional 4-5 min seated rest break with oxygen titrated up to 4L  to return to 91%. RN updated. Pt required increased time to complete all tasks. Pt reported min to mod shortness of breath throughout. OT will continue to follow acutely.    Follow Up Recommendations  Home health OT;Supervision - Intermittent    Equipment Recommendations  None recommended by OT    Recommendations for Other Services      Precautions / Restrictions Precautions Precautions: Fall;Other (comment) Precaution Comments: monitor O2 Sats, and BP Restrictions Weight Bearing Restrictions: No       Mobility Bed Mobility Overal bed mobility: Modified Independent             General bed mobility comments: HOB elevated, use of bedrail  Transfers Overall transfer level: Needs assistance Equipment used:  None Transfers: Sit to/from [employer] Transfers Sit to Stand: Min guard Stand pivot transfers: Min guard            Balance Overall balance assessment: Mild deficits observed, not formally tested                                         ADL either performed or assessed with clinical judgement   ADL Overall ADL's : Needs assistance/impaired     Grooming: Supervision/safety;Standing                               Functional mobility during ADLs: Min guard General ADL Comments: Pt able to ambulate short distances to bedroom since with min guard and without use of AD.      Vision       Perception     Praxis      Cognition Arousal/Alertness: Awake/alert Behavior During Therapy: Flat affect Overall Cognitive Status: Within Functional Limits for tasks assessed                                 General Comments: Pt continues to require encouragement to participate in therapy tasks as well as OOB tasks. Pt had yet to be out of bed this date.         Exercises Exercises: Other exercises  Other Exercises Other Exercises: Encouraged pursed lip breathing throughout Other Exercises: Flutter valve x 10  Other Exercises: Incentive spirometer x 10 with min cues on technique. Pulling 568mL.    Shoulder Instructions       General Comments Pt on 2L [place] with SpO2 94% at rest. SpO2 dropped to 74% during activity with pt requiring 4-5 min seated rest break to recovery to 90%. Pt titrated up to 4L  at end of session with SpO2 91%. Pt required increased time to complete all tasks.     Pertinent Vitals/ Pain       Pain Assessment: No/denies pain  Home Living                                          Prior Functioning/Environment              Frequency  Min 3X/week        Progress Toward Goals  OT Goals(current goals can now be found in the care plan section)  Progress towards OT goals: Progressing  toward goals(goals reviewed and updated)  Acute Rehab OT Goals Time For Goal Achievement: 05/14/19 Potential to Achieve Goals: Good ADL Goals Additional ADL Goal #3: Pt to tolerate standing up to 10 min with modified independence, in preparation for ADLs.  Plan Discharge plan remains appropriate    Co-evaluation                 AM-PAC OT "6 Clicks" Daily Activity     Outcome Measure   Help from another person eating meals?: None Help from another person taking care of personal grooming?: A Little Help from another person toileting, which includes using toliet, bedpan, or urinal?: A Little Help from another person bathing (including washing, rinsing, drying)?: A Little Help from another person to put on and taking off regular upper body clothing?: A Little Help from another person to put on and taking off regular lower body clothing?: A Little 6 Click Score: 19    End of Session Equipment Utilized During Treatment: Oxygen  OT Visit Diagnosis: Unsteadiness on feet (R26.81);Muscle weakness (generalized) (M62.81)   Activity Tolerance Patient limited by fatigue;Other (comment)(Limited by SOB)   Patient Left in chair;with call bell/phone within reach   Nurse Communication Mobility status        Time: [id number] OT Time Calculation (min): 44 min  Charges: OT General Charges $OT Visit: 1 Visit OT Treatments $Therapeutic Activity: 23-37 mins $Therapeutic Exercise: 8-22 mins  Mauri Brooklyn OTR/L [phone]   Mauri Brooklyn 04/30/2019, 2:23 PM

## 2019-05-01 LAB — GLUCOSE, CAPILLARY
Glucose-Capillary: 130 mg/dL — ABNORMAL HIGH (ref 70–99)
Glucose-Capillary: 213 mg/dL — ABNORMAL HIGH (ref 70–99)
Glucose-Capillary: 146 mg/dL — ABNORMAL HIGH (ref 70–99)
Glucose-Capillary: 146 mg/dL — ABNORMAL HIGH (ref 70–99)
Glucose-Capillary: 178 mg/dL — ABNORMAL HIGH (ref 70–99)
Glucose-Capillary: 334 mg/dL — ABNORMAL HIGH (ref 70–99)

## 2019-05-01 NOTE — Plan of Care (Addendum)
SATURATION QUALIFICATIONS: (This note is used to comply with regulatory documentation for home oxygen)  Patient Saturations on Room Air at Rest 88%  Patient Saturations on Room Air while Ambulating = n/a%  Patient Saturations on 6-8 Liters of oxygen while Ambulating = 72%  Please briefly explain why patient needs home oxygen: Pt desats to 70s on 6-8L on oxygen, he gets weak and sob while ambulating. At rest he is on 2L [place] at 95%

## 2019-05-01 NOTE — Progress Notes (Signed)
Physical Therapy Treatment Patient Details Name: Dustin Diaz MRN: [id number] DOB: Dec 20, 1945 Today's Date: 05/01/2019    History of Present Illness 74 year old male admitted with Covid /Pneumonia. PMH includes DM, HTN, hyperlipidemia, polymyalgia rheumatica. 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 Overall his oxygen requirements have increased during ambulation recently. Pt on 6L [place] at rest with SpO2 92% at rest upon approach. SpO2 dropped to 72% during ambulation on 6L/min, requiring 5-7 min of recovery for second bout of ambulation. He demonstrated futher distance and improved recovery (84ft ea), on 6L/min (5 min recovery), 8L/'min (<1 min recovery). Pt left at 7 L/min HFNC upon completion in bed with RN notified to reduce asap. Pt required additional time for all tasks. Pt reported no increased in anxiety or fear of breathing difficulty today with increased O2 delivery, and verbalized understanding to additional diaphragmatic breathing and deep breathing exercises to speed up recovery. Pt remains appropriate for continued PT intervention to address reduced functional mobility, breathing exercises & conservation.    Follow Up Recommendations  Home health PT;Supervision - Intermittent;Supervision for mobility/OOB     Equipment Recommendations  Rolling walker with 5" wheels    Recommendations for Other Services       Precautions / Restrictions Precautions Precautions: Fall;Other (comment) Precaution Comments: monitor O2 Sats, and BP Restrictions Weight Bearing Restrictions: No    Mobility  Bed Mobility Overal bed mobility: Modified Independent             General bed mobility comments: HOB elevated, use of bedrail  Transfers Overall transfer level: Needs assistance Equipment used: Rolling walker (2 wheeled) Transfers: Sit to/from [facility] Sit to Stand: Min guard Stand pivot transfers: Min guard           Ambulation/Gait Ambulation/Gait assistance: Supervision Gait Distance (Feet): 75 Feet(10x2, 25x2 ) Assistive device: Rolling walker (2 wheeled) Gait Pattern/deviations: Step-through pattern Gait velocity: decreased   General Gait Details: ambulated 47ft x2 6L/min , 65ftx1 8L/min HFNC, 79ft x1 10 L/min HFNC. Pt demonstrated no near LOB with RW and required min gaurd for safety and sequencing..   Stairs             Wheelchair Mobility    Modified Rankin (Stroke Patients Only)       Balance Overall balance assessment: Mild deficits observed, not formally tested Sitting-balance support: Feet supported Sitting balance-Leahy Scale: Good     Standing balance support: During functional activity;Bilateral upper extremity supported Standing balance-Leahy Scale: Fair Standing balance comment: steady assist while pulling up briefs                            Cognition Arousal/Alertness: Awake/alert Behavior During Therapy: WFL for tasks assessed/performed Overall Cognitive Status: Within Functional Limits for tasks assessed                                 General Comments: Pt demonstrated increased willingness to participate today. Educated on diaphragmatic breathing. Pt ambulated 28ft x2 6L/min , 38ft x 2 8 L HFNC & 10 L HFNC.       Exercises Other Exercises Other Exercises: Encouraged pursed lip breathing throughout Other Exercises: Flutter valve x 10  Other Exercises: Incentive spirometer x 10 with min cues on technique. Pulling 542mL.  Other Exercises: Diaphragmatic breathing x 10 min prior  to walking long distance(required multiple tactile and verbal cues for correct activa)    General Comments General comments (skin integrity, edema, etc.): Pt on 6L [place] at rest with SpO2 92% at rest upon approach. SpO2 dropped to 72% during ambulation on 6L/min, requiring 5-7 min of recovery for second bout of ambulation. He demonstrated futher distance  and improved recovery (90ft ea), on 6L/min (5 min recovery), 8L/'min (<1 min recovery). Pt left at 7 L/min HFNC upon completion in bed with RN notified to reduce asap. Pt required additional time for all tasks.       Pertinent Vitals/Pain Pain Assessment: No/denies pain Pain Score: 0-No pain    Home Living                      Prior Function            PT Goals (current goals can now be found in the care plan section) Acute Rehab PT Goals Patient Stated Goal: Go back home to see family PT Goal Formulation: With patient Time For Goal Achievement: 05/08/19 Potential to Achieve Goals: Fair Progress towards PT goals: Progressing toward goals    Frequency    Min 3X/week      PT Plan Current plan remains appropriate    Co-evaluation              AM-PAC PT "6 Clicks" Mobility   Outcome Measure  Help needed turning from your back to your side while in a flat bed without using bedrails?: None Help needed moving from lying on your back to sitting on the side of a flat bed without using bedrails?: None Help needed moving to and from a bed to a chair (including a wheelchair)?: A Little Help needed standing up from a chair using your arms (e.g., wheelchair or bedside chair)?: A Little Help needed to walk in hospital room?: A Little Help needed climbing 3-5 steps with a railing? : A Lot 6 Click Score: 19    End of Session Equipment Utilized During Treatment: Oxygen;Gait belt;Other (comment)(RW) Activity Tolerance: Patient limited by fatigue Patient left: in chair;with call bell/phone within reach;with nursing/sitter in room Nurse Communication: Mobility status PT Visit Diagnosis: Unsteadiness on feet (R26.81);Other abnormalities of gait and mobility (R26.89);Muscle weakness (generalized) (M62.81)     Time: [id number] PT Time Calculation (min) (ACUTE ONLY): 55 min  Charges:  $Gait Training: 23-37 mins $Therapeutic Exercise: 8-22 mins $Therapeutic Activity:  8-22 mins                     Ann Held PT, DPT Acute Rehab [place] P: [place] 05/01/2019, 11:26 AM

## 2019-05-01 NOTE — Progress Notes (Signed)
PROGRESS NOTE    Dustin Diaz  [id number] DOB: [birth year] DOA: 04/05/2019 PCP: Lavone Orn, MD    Brief Narrative:   Dustin Diaz is a 74 y.o. male with past medical history remarkable for of DM-2, HTN, PMR (on chronic steroids), HLD,  who presented to the hospital on 1/14 with shortness of breath-was found to have acute hypoxic respiratory failure secondary to COVID-19 pneumonia.  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   Assessment & Plan:   Principal Problem:   Pneumonia due to COVID-19 virus Active Problems:   Hypertension   Diabetes ([place])   High cholesterol   Polymyalgia rheumatica (HCC)   Acute hypoxic respiratory failure secondary to acute Covid-19 viral pneumonia during the ongoing Covid 19 Pandemic - POA Patient presenting to ED with progressive shortness of breath and found to be hypoxic secondary to Covid-19 pneumonia.  Initially CRP elevated at 36.4 with a D-dimer of 1.85. Received 2 doses of Actemra on 1/15 and 1/16.  Completed 5-day course of IV remdesivir on 1/18.  Initially started on IV Decadron which has now been weaned back to his home prednisone 3 mg p.o. daily.  Continues with hypoxia at rest on 2L [place], which is extensively worsened with ambulation, with drop in SPO2 to 70% on 8 L nasal cannula while brushing his teeth in the bathroom yesterday, requiring 10 L nasal cannula and roughly 10 minutes for recovery. --Continue home prednisone 3 mg p.o. daily --Continue supplemental oxygen, titrate to maintain SPO2 greater than 92% --Continue supportive care with vitamin C, zinc, Tylenol, Combivent MDI --Continue airborne/contact isolation precautions --Discussed with RN, repeat walking trial today to eval how much supplemental oxygen he requires to maintain SPO2 greater than 90% with exertion; discussed with patient likely will discharge  home in next 1-2 days with home oxygen  HSV 1 infection/skin lesions Patient developed nodular cavitary skin lesions to his neck, lip, and occipital area during hospitalization.  Initially thought secondary to fungal infection.  Underwent punch biopsy by general surgery with biopsy notable for HSV 1 infection.  Infectious disease was consulted and recommended initiation of IV acyclovir.  Given the lesions are now crusted over,  transitioned to oral valacyclovir 1000 mg p.o. twice daily to complete a total course of 14 days.  Left lower extremity DVT: CTA chest on 04/09/2019 negative for pulmonary embolism. --Continue Eliquis  Type 2 diabetes mellitus Hemoglobin A1c 7.1 on 04/06/2019, well controlled.  On Glimepiride 2 mg p.o. daily, Jardiance 25 mg p.o. daily, Metformin 2000 mg every morning, pioglitazone 45 mg p.o. daily at home. --Holding home hypoglycemic regimen while inpatient --Continue Lantus 8 units subcutaneously daily --Novolog 6u [place] TIDAC --Insulin scale for further coverage  Essential hypertension On amlodipine-valsartan 5-160 mg p.o. daily at home.  Patient with episodes of dizziness, and is antihypertensives were discontinued.  BP 126/65 today, fairly well controlled. --Continue monitoring BP off of his home antihypertensives  Dyslipidemia: Continue statin  History of PMR --Continue prednisone 3 mg p.o. daily (home dose)  Right hilar lymphadenopathy Likely reactive secondary to acute infectious process with Covid-19 pneumonia versus HSV-1 infection. 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  To be coordinated by PCP.  Congestion --Continue Flonase and saline nasal spray --Start Claritin 10 mg p.o. daily   DVT prophylaxis: Eliquis Code Status:  Full code Family Communication: Updated patient spouse, Velva Harman via telephone  this morning Disposition Plan: From home with spouse, currently on 2 L nasal cannula at rest but with significant hypoxia with brushing teeth yesterday, down to 70's on 10 L nasal cannula with 10 min recovery.  Currently unsafe discharge home at this time until exertional FiO2 requirements improved.  Anticipate discharge back home with home health when FiO2 requirements are more reasonable; hopeful in the next 24-48 hours.  Repeating exertional ambulation test today.   Consultants:   General surgery  Infectious disease  Procedures:   Punch biopsy right neck lesion by general surgery on 1/28 2021  Antimicrobials:   Acyclovir 2/1 - 2/4 Valtrex 2/4>> Amphotericin B 1/27 - 2/1 Cefepime 1/26 - 1/27 Vancomycin 1/26 - 1/27 Ceftriaxone 1/15 - 1/15 Remdesivir 1/14 - 1/18 Actemra 1/15 and 1/16    Subjective: Patient seen and examined at bedside, resting comfortably.  Continues on 2 L nasal cannula, but becomes significantly hypoxic, desaturated to the 70s while brushing his teeth yesterday, required 10 L nasal cannula with 10-minute recovery time. Updated patient's spouse via telephone this morning.  No other complaints at this time.  No other acute concerns overnight by nursing staff.  Objective: Vitals:   04/30/19 1717 04/30/19 2000 05/01/19 0330 05/01/19 0738  BP: 126/82 122/70 126/82 111/70  Pulse: 90 86 85 70  Resp: 20 18 16 16   Temp: (!) 97.5 F (36.4 C) (!) 97.5 F (36.4 C) 97.9 F (36.6 C) 97.8 F (36.6 C)  TempSrc: Oral Oral Oral Oral  SpO2: 94% 96% 96% 98%  Weight:      Height:        Intake/Output Summary (Last 24 hours) at 05/01/2019 1108 Last data filed at 05/01/2019 [id number] Gross per 24 hour  Intake 720 ml  Output 1500 ml  Net -780 ml   Filed Weights   04/06/19 0145  Weight: 78 kg    Examination:  General exam: Appears calm and comfortable  Respiratory system: Crackles bilateral bases with mild mid inspiratory wheezing bilateral upper lobes, Respiratory  effort normal.  On 2 L nasal cannula at rest and continues with significant hypoxia and increased respiratory rate with minimal exertion Cardiovascular system: S1 & S2 heard, RRR. No JVD, murmurs, rubs, gallops or clicks. No pedal edema. Gastrointestinal system: Abdomen is nondistended, soft and nontender. No organomegaly or masses felt. Normal bowel sounds heard. Central nervous system: Alert and oriented. No focal neurological deficits. Extremities: Symmetric 5 x 5 power. Skin: No rashes, lesions or ulcers Psychiatry: Judgement and insight appear normal. Mood & affect appropriate.     Data Reviewed: I have personally reviewed following labs and imaging studies  CBC: Recent Labs  Lab 04/25/19 0447 04/27/19 0421  WBC 8.8 8.5  NEUTROABS 5.7  --   HGB 12.8* 12.7*  HCT 38.8* 39.7  MCV 91.7 93.2  PLT 203 [id number]   Basic Metabolic Panel: Recent Labs  Lab 04/25/19 0447 04/27/19 0421 04/29/19 0535  NA 136 138 137  K 4.1 4.5 3.6  CL 99 101 97*  CO2 28 30 30   GLUCOSE 142* 150* 131*  BUN 27* 30* 26*  CREATININE 1.01 1.12 0.91  CALCIUM 8.5* 8.5* 8.8*  MG 2.2  --   --    GFR: Estimated Creatinine Clearance: 75.8 mL/min (by C-G formula based on SCr of 0.91 mg/dL). Liver Function Tests: Recent Labs  Lab 04/25/19 0447  AST 19  ALT 16  ALKPHOS 61  BILITOT 1.2  PROT  5.8*  ALBUMIN 3.3*   No results for input(s): LIPASE, AMYLASE in the last 168 hours. No results for input(s): AMMONIA in the last 168 hours. Coagulation Profile: No results for input(s): INR, PROTIME in the last 168 hours. Cardiac Enzymes: No results for input(s): CKTOTAL, CKMB, CKMBINDEX, TROPONINI in the last 168 hours. BNP (last 3 results) No results for input(s): PROBNP in the last 8760 hours. HbA1C: No results for input(s): HGBA1C in the last 72 hours. CBG: Recent Labs  Lab 04/29/19 2016 04/30/19 0752 04/30/19 1239 04/30/19 1711 05/01/19 0736  GLUCAP 181* 150* 130* 213* 146*   Lipid Profile: No  results for input(s): CHOL, HDL, LDLCALC, TRIG, CHOLHDL, LDLDIRECT in the last 72 hours. Thyroid Function Tests: No results for input(s): TSH, T4TOTAL, FREET4, T3FREE, THYROIDAB in the last 72 hours. Anemia Panel: No results for input(s): VITAMINB12, FOLATE, FERRITIN, TIBC, IRON, RETICCTPCT in the last 72 hours. Sepsis Labs: No results for input(s): PROCALCITON, LATICACIDVEN in the last 168 hours.  No results found for this or any previous visit (from the past 240 hour(s)).       Radiology Studies: No results found.      Scheduled Meds: . acyclovir ointment   Topical 4x daily  . apixaban  5 mg Oral BID  . vitamin C  500 mg Oral Daily  . atorvastatin  10 mg Oral Daily  . docusate sodium  200 mg Oral BID  . feeding supplement (ENSURE ENLIVE)  237 mL Oral BID BM  . fluticasone  2 spray Each Nare Daily  . insulin aspart  0-15 Units Subcutaneous TID WC  . insulin aspart  0-5 Units Subcutaneous QHS  . insulin aspart  6 Units Subcutaneous TID WC  . insulin glargine  8 Units Subcutaneous QHS  . Ipratropium-Albuterol  1 puff Inhalation Q6H  . latanoprost  1 drop Both Eyes QHS  . loratadine  10 mg Oral Daily  . oxymetazoline  1 spray Each Nare BID  . pantoprazole  40 mg Oral Daily  . predniSONE  3 mg Oral Daily  . valACYclovir  1,000 mg Oral BID  . zinc sulfate  220 mg Oral Daily   Continuous Infusions:    LOS: 26 days    Time spent: 34 minutes spent on chart review, discussion with nursing staff, consultants, updating family and interview/physical exam; more than 50% of that time was spent in counseling and/or coordination of care.    Cilicia Borden J [place] DO [facility] Available via Epic secure chat 7am-7pm After these hours, please refer to coverage provider listed on [email or web address] 05/01/2019, 11:08 AM

## 2019-05-01 NOTE — Plan of Care (Signed)
No acute events during this shift. Pt refuses to go for walk with this nurse. Stating that he doesn't want his oxygen to drop. Pt educated on need of being ambulatory. VSS     Problem: Education: Goal: Knowledge of risk factors and measures for prevention of condition will improve Outcome: Progressing   Problem: Coping: Goal: Psychosocial and spiritual needs will be supported Outcome: Progressing   Problem: Respiratory: Goal: Will maintain a patent airway Outcome: Progressing Goal: Complications related to the disease process, condition or treatment will be avoided or minimized Outcome: Progressing   Problem: Education: Goal: Knowledge of General Education information will improve Description: Including pain rating scale, medication(s)/side effects and non-pharmacologic comfort measures Outcome: Progressing   Problem: Health Behavior/Discharge Planning: Goal: Ability to manage health-related needs will improve Outcome: Progressing   Problem: Clinical Measurements: Goal: Ability to maintain clinical measurements within normal limits will improve Outcome: Progressing Goal: Will remain free from infection Outcome: Progressing Goal: Diagnostic test results will improve Outcome: Progressing Goal: Respiratory complications will improve Outcome: Progressing Goal: Cardiovascular complication will be avoided Outcome: Progressing   Problem: Activity: Goal: Risk for activity intolerance will decrease Outcome: Progressing   Problem: Nutrition: Goal: Adequate nutrition will be maintained Outcome: Progressing   Problem: Coping: Goal: Level of anxiety will decrease Outcome: Progressing   Problem: Elimination: Goal: Will not experience complications related to bowel motility Outcome: Progressing Goal: Will not experience complications related to urinary retention Outcome: Progressing   Problem: Pain Managment: Goal: General experience of comfort will improve Outcome:  Progressing   Problem: Safety: Goal: Ability to remain free from injury will improve Outcome: Progressing   Problem: Skin Integrity: Goal: Risk for impaired skin integrity will decrease Outcome: Progressing

## 2019-05-01 NOTE — TOC Progression Note (Signed)
Transition of Care [facility]) - Progression Note    Patient Details  Name: Dustin Diaz MRN: [id number] Date of Birth: 19-May-1945  Transition of [facility]) CM/SW Contact  Joaquin Courts, RN Phone Number: 05/01/2019, 2:21 PM  Clinical Narrative: CM attempted to reach patient by telephone but was unsuccessful.  CM spoke with patient's spouse Dustin Diaz and discussed need to [facility] and dme at discharge.  Dustin Diaz reports patient has rolling walker and shower chair at home. [employer] set up to deliver home oxygen (concetrator and portable tank scheduled to be delivered to home 2/10/2).  Dustin Diaz reports she will be picking patient up from hospital at discharge and CM discussed that she will need to bring portable tank with her for transport.  Dustin Diaz reports she would prefer [facility] for HHPT/OT services, but is open to another agency.   CM contacted area [facility] agencies with the following results: [facility]: is in network with payor and can provide HHPT but has no HHOT availability. [facility] :  Is out of network with payor [facility]: unable to reach rep [facility]: out of network [facility]: out of network [facility]: in network and can provide HHPT/OT starting on Saturday [facility]: doe snot service Covid + patients   This information was communicated to [place] who prefers for [facility] to provide the [place] services, she understands that there will not be HHOT and is agreeable to proceed with [facility].  Referral given to [facility] rep, Santiago Glad.     Expected Discharge Plan: [place] Barriers to Discharge: Continued Medical Work up  Expected Discharge Plan and Services Expected Discharge Plan: [place]   Discharge Planning Services: CM Consult Post Acute Care Choice: Home Health, Durable Medical Equipment Living arrangements for the past 2 months: Single Family Home                 DME Arranged: Oxygen DME Agency: [facility] Date DME Agency Contacted: 05/01/19 Time DME Agency  Contacted: [phone] Representative spoke with at DME Agency: [place]: PT [place]: [place] ([place]) Date [place]: 05/01/19 Time [employer]: [place] Representative spoke with at [place]: [place] ([place]) Interventions    Readmission Risk Interventions Readmission Risk Prevention Plan 05/01/2019  Transportation Screening Complete  HRI or [place] Complete  Social Work Consult for [place] Planning/Counseling Complete  Palliative Care Screening Not Applicable  Medication Review Press photographer) Complete  Some recent data might be hidden

## 2019-05-02 DIAGNOSIS — U071 COVID-19: Principal | ICD-10-CM

## 2019-05-02 DIAGNOSIS — R590 Localized enlarged lymph nodes: Secondary | ICD-10-CM

## 2019-05-02 DIAGNOSIS — I82402 Acute embolism and thrombosis of unspecified deep veins of left lower extremity: Secondary | ICD-10-CM | POA: Diagnosis present

## 2019-05-02 DIAGNOSIS — I824Z2 Acute embolism and thrombosis of unspecified deep veins of left distal lower extremity: Secondary | ICD-10-CM

## 2019-05-02 DIAGNOSIS — B009 Herpesviral infection, unspecified: Secondary | ICD-10-CM

## 2019-05-02 DIAGNOSIS — J9601 Acute respiratory failure with hypoxia: Secondary | ICD-10-CM

## 2019-05-02 DIAGNOSIS — R935 Abnormal findings on diagnostic imaging of other abdominal regions, including retroperitoneum: Secondary | ICD-10-CM | POA: Diagnosis present

## 2019-05-02 DIAGNOSIS — J9621 Acute and chronic respiratory failure with hypoxia: Secondary | ICD-10-CM

## 2019-05-02 LAB — C-REACTIVE PROTEIN: CRP: 0.7 mg/dL (ref <1.0)

## 2019-05-02 LAB — GLUCOSE, CAPILLARY
Glucose-Capillary: 157 mg/dL — ABNORMAL HIGH (ref 70–99)
Glucose-Capillary: 135 mg/dL — ABNORMAL HIGH (ref 70–99)
Glucose-Capillary: 220 mg/dL — ABNORMAL HIGH (ref 70–99)

## 2019-05-02 NOTE — Progress Notes (Addendum)
Occupational Therapy Treatment Patient Details Name: Dustin Diaz MRN: [id number] DOB: 29-Jan-1946 Today's Date: 05/02/2019    History of present illness 74 year old male admitted with Covid /Pneumonia. PMH includes DM, HTN, hyperlipidemia, polymyalgia rheumatica. herpes virus on lip and neck   OT comments  Pt making slow progress in therapy, demonstrating limited activity tolerance and continuing to require supplemental oxygen. Pt able to ambulate to/from bathroom with RW and supervision. Noted 0 instances of loss of balance, however pt unsteady on feet. Pt tolerated standing 1 x 1.5 min and 1 x 1 min to complete grooming/hygiene tasks. SpO2 dropped to 74% on 3L [place] during standing tasks with pt reporting max shortness of breath. Pt required 5+ min seated recovery to return to 80s with oxygen titrated up to 5L [place]. Pt engaged in seated sponge bathing task with SpO2 dropping to 84% on 5L [place] and pt reporting moderate shortness of breath. Pt required significant increase in time to complete all tasks due to frequent and extensive seated rest breaks. RN updated. Continued education with pt on breathing exercises, safety strategies, and energy conservation techniques with good understanding. Recommend CIR for additional rehab prior to discharge home. At this time pt is unable to safely and independently care for himself.    Follow Up Recommendations  CIR    Equipment Recommendations  None recommended by OT    Recommendations for Other Services      Precautions / Restrictions Precautions Precautions: Fall;Other (comment) Precaution Comments: Monitor SpO2 Restrictions Weight Bearing Restrictions: No       Mobility Bed Mobility Overal bed mobility: Modified Independent             General bed mobility comments: HOB elevated, use of bedrail  Transfers Overall transfer level: Needs assistance Equipment used: Rolling walker (2 wheeled) Transfers: Sit to/from [employer]  Transfers Sit to Stand: Supervision Stand pivot transfers: Supervision            Balance Overall balance assessment: Mild deficits observed, not formally tested                                         ADL either performed or assessed with clinical judgement   ADL Overall ADL's : Needs assistance/impaired     Grooming: Set up;Supervision/safety;Sitting;Standing   Upper Body Bathing: Set up;Supervision/ safety;Sitting;Standing   Lower Body Bathing: Set up;Supervison/ safety;Sit to/from stand       Lower Body Dressing: Supervision/safety;Set up;Sit to/from stand               Functional mobility during ADLs: Supervision/safety;Rolling walker General ADL Comments: Pt able to ambulate to/from bathroom with RW and supervision. Noted 0 instances of LOB, however pt unsteady on feet.      Vision       Perception     Praxis      Cognition Arousal/Alertness: Awake/alert Behavior During Therapy: WFL for tasks assessed/performed Overall Cognitive Status: Within Functional Limits for tasks assessed                                 General Comments: Pt continues to demonstrate increased willingness to participate in therapy session.        Exercises Exercises: Other exercises Other Exercises Other Exercises: Encouraged pursed lip breathing throughout   Shoulder Instructions  General Comments Pt on 3L [place] with SpO2 93% at rest. SpO2 drops to 74% during standing activity with pt requiring 5+ min seated recovery. Titrated pt up to 5L [place] with SpO2 dropping to low 80s with seated sponge bathing task. RN updated.     Pertinent Vitals/ Pain       Pain Assessment: No/denies pain  Home Living                                          Prior Functioning/Environment              Frequency           Progress Toward Goals  OT Goals(current goals can now be found in the care plan section)  Progress towards  OT goals: Progressing toward goals  ADL Goals Additional ADL Goal #1: Pt will complete ADL task with SpO2 maintianing above 88 independently using pursed lip breathing techniques Additional ADL Goal #2: Pt will independently verbalize 3 energy conservation strategies Additional ADL Goal #3: Pt to tolerate standing up to 10 min with modified independence, in preparation for ADLs.  Plan Discharge plan needs to be updated    Co-evaluation                 AM-PAC OT "6 Clicks" Daily Activity     Outcome Measure   Help from another person eating meals?: None Help from another person taking care of personal grooming?: A Little Help from another person toileting, which includes using toliet, bedpan, or urinal?: A Little Help from another person bathing (including washing, rinsing, drying)?: A Little Help from another person to put on and taking off regular upper body clothing?: A Little Help from another person to put on and taking off regular lower body clothing?: A Little 6 Click Score: 19    End of Session Equipment Utilized During Treatment: Oxygen;Rolling walker  OT Visit Diagnosis: Unsteadiness on feet (R26.81);Muscle weakness (generalized) (M62.81)   Activity Tolerance Patient limited by fatigue;Other (comment)(Limited by SOB)   Patient Left in chair;with call bell/phone within reach   Nurse Communication Mobility status        Time: [id number] OT Time Calculation (min): 46 min  Charges: OT General Charges $OT Visit: 1 Visit OT Treatments $Self Care/Home Management : 8-22 mins $Therapeutic Activity: 23-37 mins  Mauri Brooklyn OTR/L [phone]   Mauri Brooklyn 05/02/2019, 8:46 AM

## 2019-05-02 NOTE — Progress Notes (Signed)
PROGRESS NOTE  Dustin Diaz [id number] DOB: [birth year] DOA: 04/05/2019 PCP: Lavone Orn, MD  HPI/Recap of past 24 hours: Dustin Diaz is a 74 y.o.malewith past medical history remarkable for of DM-2, HTN, PMR (on chronic steroids), HLD, who presented to the hospital on 1/14 with shortness of breath-was found to have acute hypoxic respiratory failure secondary to COVID-19 pneumonia.   Patient treated with 5-day course of IV Remdisivir and high-dose IV steroids and since then has been tapered back to his home dose.  Hospital course has been complicated by lower extremity DVT, ulcerated skin lesion status post biopsy found to be consistent with HSV-1 infection and slow improvement in hypoxia.  Patient evaluated by PT recommending home health PT, but he is still quite winded even with mild ambulation.  OT recommended inpatient rehab, but patient not felt to gain much from an inpatient rehab stay.  Patient himself feels weak, easily winded.  Assessment/Plan: Principal Problem:   Acute on chronic respiratory failure with hypoxia (HCC) secondary to COVID-19 viral pneumonia: Status post Actemra x2 doses, 5-day course of IV Remdisivir and high-dose steroids.  Steroids now weaned down to home dose.  He has minimal hypoxia and symptoms at rest, getting by on 2 L.  However, even with mild ambulation requires significantly more oxygen, currently up to 5 L.  Will check CRP for completeness sake.  BNP has not changed and no history of heart failure.  Will check daily weights though.  He will need to go home on oxygen. Active Problems:   Hypertension: Has some dizziness and so his oral medication home regimen was discontinued.  Blood pressures have continued remained stable off of medication.    Diabetes ([place]), well controlled.  While inpatient, holding home oral regimen.  On Lantus 8 units subcu daily.  CBGs have been relatively stable with most CBGs below 200 although he did have a high sugar  yesterday afternoon.  Hyperlipidemia: Continue statin.    Polymyalgia rheumatica ([name]): On daily dose of prednisone 3 mg.    Acute deep vein thrombosis (DVT) of left lower extremity ([place]): CTA negative on 1/18 for PE.  On Eliquis.  Will need this for 6 months.    HSV-1 (herpes simplex virus 1) infection: Patient developed nodular cavitary skin lesions to his neck, left and occipital area during hospitalization which were initially thought to be secondary to fungal infection, however he underwent punch biopsy by general surgery and pathology came back notable for HSV 1 infection.  Infectious disease consulted and patient was started on IV acyclovir and now the lesions have crusted over, he has been changed over to oral valacyclovir for total course of 14 days.    Abnormal CT of abdomen: Questionable pancreatic cyst seen on CT.  Patient will need outpatient MRI of the abdomen pelvis    Hilar lymphadenopathy: Secondary to acute infectious process with Covid most likely versus HSV infection.  Repeat imaging in 3 to 6 months as an outpatient.   Code Status: Full code  Family Communication: Updated wife by phone  Disposition Plan: 1 more day to continue to improve deconditioning and oxygenation.  Discharge home tomorrow with home health PT, RN and home oxygen.   Consultants:  General Surgery  Infectious Disease   Procedures:  S/p punch biopsy    Antimicrobials: Acyclovir 2/1 - 2/4 Valtrex 2/4-2/14 Amphotericin B 1/27 - 2/1 Cefepime 1/26 - 1/27 Vancomycin 1/26 - 1/27 Ceftriaxone 1/15 - 1/15 Remdesivir 1/14 - 1/18  Actemra 1/15 and  1/16   DVT prophylaxis:  Eliquis   Objective: Vitals:   05/02/19 0501 05/02/19 0800  BP: 117/68 116/67  Pulse: 72 70  Resp: 18 16  Temp: 97.8 F (36.6 C) 98.3 F (36.8 C)  SpO2: 97% 96%    Intake/Output Summary (Last 24 hours) at 05/02/2019 1151 Last data filed at 05/02/2019 0502 Gross per 24 hour  Intake 600 ml  Output 800 ml  Net  -200 ml   Filed Weights   04/06/19 0145  Weight: 78 kg   Body mass index is 24.67 kg/m.  Exam:   General:  Alert & oriented x3, nad  HEENT: Normocephalic, atraumatic, crusted lesion on left side of lower lip, mucous membranes are moist  Cardiovascular: Regular rate and rhythm, S1-S2  Respiratory: Clear to auscultation bilaterally  Abdomen: Soft, nontender, nondistended, positive bowel sounds  Musculoskeletal: No clubbing or cyanosis or edema  Skin: Crusted lesions noted on patient's lower lip, neck and occipital area of his scalp  Psychiatry: Appropriate, no evidence of psychoses   Data Reviewed: CBC: Recent Labs  Lab 04/27/19 0421  WBC 8.5  HGB 12.7*  HCT 39.7  MCV 93.2  PLT 123456   Basic Metabolic Panel: Recent Labs  Lab 04/27/19 0421 04/29/19 0535  NA 138 137  K 4.5 3.6  CL 101 97*  CO2 30 30  GLUCOSE 150* 131*  BUN 30* 26*  CREATININE 1.12 0.91  CALCIUM 8.5* 8.8*   GFR: Estimated Creatinine Clearance: 75.8 mL/min (by C-G formula based on SCr of 0.91 mg/dL). Liver Function Tests: No results for input(s): AST, ALT, ALKPHOS, BILITOT, PROT, ALBUMIN in the last 168 hours. No results for input(s): LIPASE, AMYLASE in the last 168 hours. No results for input(s): AMMONIA in the last 168 hours. Coagulation Profile: No results for input(s): INR, PROTIME in the last 168 hours. Cardiac Enzymes: No results for input(s): CKTOTAL, CKMB, CKMBINDEX, TROPONINI in the last 168 hours. BNP (last 3 results) No results for input(s): PROBNP in the last 8760 hours. HbA1C: No results for input(s): HGBA1C in the last 72 hours. CBG: Recent Labs  Lab 04/30/19 2127 05/01/19 0736 05/01/19 1122 05/01/19 1609 05/02/19 0724  GLUCAP 146* 146* 178* 334* 157*   Lipid Profile: No results for input(s): CHOL, HDL, LDLCALC, TRIG, CHOLHDL, LDLDIRECT in the last 72 hours. Thyroid Function Tests: No results for input(s): TSH, T4TOTAL, FREET4, T3FREE, THYROIDAB in the last 72  hours. Anemia Panel: No results for input(s): VITAMINB12, FOLATE, FERRITIN, TIBC, IRON, RETICCTPCT in the last 72 hours. Urine analysis: No results found for: COLORURINE, APPEARANCEUR, LABSPEC, PHURINE, GLUCOSEU, HGBUR, BILIRUBINUR, KETONESUR, PROTEINUR, UROBILINOGEN, NITRITE, LEUKOCYTESUR Sepsis Labs: @LABRCNTIP (procalcitonin:4,lacticidven:4)  )No results found for this or any previous visit (from the past 240 hour(s)).    Studies: No results found.  Scheduled Meds: . acyclovir ointment   Topical 4x daily  . apixaban  5 mg Oral BID  . vitamin C  500 mg Oral Daily  . atorvastatin  10 mg Oral Daily  . docusate sodium  200 mg Oral BID  . feeding supplement (ENSURE ENLIVE)  237 mL Oral BID BM  . fluticasone  2 spray Each Nare Daily  . insulin aspart  0-15 Units Subcutaneous TID WC  . insulin aspart  0-5 Units Subcutaneous QHS  . insulin aspart  6 Units Subcutaneous TID WC  . insulin glargine  8 Units Subcutaneous QHS  . Ipratropium-Albuterol  1 puff Inhalation Q6H  . latanoprost  1 drop Both Eyes QHS  . loratadine  10 mg Oral Daily  . oxymetazoline  1 spray Each Nare BID  . pantoprazole  40 mg Oral Daily  . predniSONE  3 mg Oral Daily  . valACYclovir  1,000 mg Oral BID  . zinc sulfate  220 mg Oral Daily    Continuous Infusions:   LOS: 27 days     Annita Brod, MD [facility]   05/02/2019, 11:51 AM

## 2019-05-02 NOTE — Plan of Care (Signed)

## 2019-05-02 NOTE — Progress Notes (Signed)
Rehab Admissions Coordinator Note:  Per OT recommendation, this patient was screened by Raechel Ache for appropriateness for an Inpatient Acute Rehab Consult.  Pt appears to be supervision for ambulation and ADLs. He is limited mostly by activity tolerance due to his cardiopulmonary status. It is unlikely that a short CIR stay will change his overall funcitonal status. Per chart review, pt has a wife/family at home for supervisory needs.  At this time, we are recommending [place] at DC.   Raechel Ache 05/02/2019, 9:19 AM  I can be reached at [phone].

## 2019-05-02 NOTE — Progress Notes (Addendum)
Physical Therapy Treatment Patient Details Name: Dustin Diaz MRN: [id number] DOB: Feb 26, 1946 Today's Date: 05/02/2019    History of Present Illness 74 year old male admitted with Covid /Pneumonia. PMH includes DM, HTN, hyperlipidemia, polymyalgia rheumatica. herpes virus on lip and neck    PT Comments    Pt demonstrates much improved tolerance to ambulation today, but continues to demonstrate high difficulty with large muscle group activities. Weight patient has dropped 11 KG in the last month with visible LE wasting and atrophy (per pt report). Pt prior lvl was independent, but due to current oxygen demands and monitoring for basic ADLs PT is recommending updated rehab recommendations 1-2 weeks of inpatient rehab. Pt is agreeable to this as long as his wife can visit intermittently. His motivation to participate has been very high the past couple of days. During ambulation today was able to walk 150 ft total (90 & 60 with 5 min seated rest break). His O2 sats droped to 85% on 3L O2, and recovered to >90% 3-5 min. He dropped to 72% during sit to stands (x7), requiring 5-7 minutes to recover at 6L O2. Pt educated if unable to go to rehab how to perform safely at home. His heart remained consistent and within functional limits of exercise with no concern currently. He does seem to have good support at a home environment, but for safest transition due to severe wasting, 1 week of therapy is highly recommended at this point. Pt demonstrated 2 near falls requiring modA to maintain balance due to muscle wasting.    Follow Up Recommendations  Other (comment)(CIR VS SNF needs 1 week of rehab to be safe with ADL)     Equipment Recommendations       Recommendations for Other Services Rehab consult     Precautions / Restrictions Precautions Precautions: Fall;Other (comment) Precaution Comments: Monitor SpO2 Restrictions Weight Bearing Restrictions: No    Mobility  Bed Mobility Overal bed  mobility: Modified Independent             General bed mobility comments: HOB elevated, use of bedrail  Transfers Overall transfer level: Needs assistance Equipment used: Rolling walker (2 wheeled) Transfers: Sit to/from [facility] Sit to Stand: Min guard Stand pivot transfers: Supervision          Ambulation/Gait Ambulation/Gait assistance: Min guard Gait Distance (Feet): 150 Feet(6ft 3L/min [place], 60 ft 3L/min after 5 min rest break) Assistive device: Rolling walker (2 wheeled) Gait Pattern/deviations: Step-through pattern Gait velocity: decreased Gait velocity interpretation: <1.8 ft/sec, indicate of risk for recurrent falls General Gait Details: Ambulated 90 ft x1 3L/min [place].   Stairs             Wheelchair Mobility    Modified Rankin (Stroke Patients Only)       Balance Overall balance assessment: Mild deficits observed, not formally tested Sitting-balance support: Feet supported Sitting balance-Leahy Scale: Good   Postural control: (mild forward leaning, 2 x LOB noted today) Standing balance support: Bilateral upper extremity supported Standing balance-Leahy Scale: Fair Standing balance comment: slightly unsteady when donning face mask for ambulation in hall                            Cognition Arousal/Alertness: Awake/alert Behavior During Therapy: [facility] for tasks assessed/performed Overall Cognitive Status: Within Functional Limits for tasks assessed  General Comments: Pt continues to demonstrate increased willingness to participate in therapy session.      Exercises General Exercises - Lower Extremity Ankle Circles/Pumps: AROM;20 reps;Both;Seated Long Arc Quad: AROM;Both;20 reps;Seated Hip Flexion/Marching: AROM;20 reps;Seated Mini-Sqauts: Strengthening;Other (comment)(7 reps, near full sit to stand with UE support) Other Exercises Other Exercises: Encouraged pursed lip  breathing throughout Other Exercises: Diaphragmatic breathing with tactile cues for optimal recruitment Other Exercises: Home Cardio education    General Comments General comments (skin integrity, edema, etc.): Pt on 2L [place] with SpO2 96% at rest today. SpO2 drops to 85% with patient on 3L [place] during ambulation today. Pt tends to drop more when performing strengthening exercises.  Pt required 5L O2 after performing sit to stands x7 and SpO2 dropped to 72% but recovered in <2 min to 90%. RN updated      Pertinent Vitals/Pain Pain Assessment: No/denies pain Pain Score: 0-No pain    Home Living                      Prior Function            PT Goals (current goals can now be found in the care plan section) Acute Rehab PT Goals Patient Stated Goal: Go back home to see family PT Goal Formulation: With patient Time For Goal Achievement: 05/08/19 Potential to Achieve Goals: Fair Progress towards PT goals: Progressing toward goals    Frequency    Min 3X/week      PT Plan Discharge plan needs to be updated    Co-evaluation              AM-PAC PT "6 Clicks" Mobility   Outcome Measure  Help needed turning from your back to your side while in a flat bed without using bedrails?: None Help needed moving from lying on your back to sitting on the side of a flat bed without using bedrails?: None Help needed moving to and from a bed to a chair (including a wheelchair)?: A Little Help needed standing up from a chair using your arms (e.g., wheelchair or bedside chair)?: A Little Help needed to walk in hospital room?: A Little Help needed climbing 3-5 steps with a railing? : A Lot 6 Click Score: 19    End of Session Equipment Utilized During Treatment: Oxygen;Gait belt;Other (comment) Activity Tolerance: Patient limited by fatigue Patient left: in chair;with call bell/phone within reach;with nursing/sitter in room Nurse Communication: Mobility status PT Visit Diagnosis:  Unsteadiness on feet (R26.81);Other abnormalities of gait and mobility (R26.89);Muscle weakness (generalized) (M62.81)     Time: [id number] PT Time Calculation (min) (ACUTE ONLY): 60 min  Charges:  $Gait Training: 23-37 mins $Therapeutic Exercise: 8-22 mins $Therapeutic Activity: 8-22 mins                     Ann Held PT, DPT Acute Rehab [place] [address] 05/02/2019, 3:49 PM   Please reach out to me if you wish to discuss this further.

## 2019-05-03 DIAGNOSIS — M353 Polymyalgia rheumatica: Secondary | ICD-10-CM

## 2019-05-03 LAB — GLUCOSE, CAPILLARY
Glucose-Capillary: 193 mg/dL — ABNORMAL HIGH (ref 70–99)
Glucose-Capillary: 124 mg/dL — ABNORMAL HIGH (ref 70–99)
Glucose-Capillary: 138 mg/dL — ABNORMAL HIGH (ref 70–99)

## 2019-05-03 MED ORDER — ENSURE ENLIVE PO LIQD: 237.0000 mL | Freq: Two times a day (BID) | ORAL | 12 refills | Status: DC

## 2019-05-03 MED ORDER — GUAIFENESIN-DM 100-10 MG/5ML PO SYRP: 10.0000 mL | ORAL_SOLUTION | ORAL | 0 refills | Status: DC | PRN

## 2019-05-03 MED ORDER — ACYCLOVIR 5 % EX OINT: TOPICAL_OINTMENT | Freq: Four times a day (QID) | CUTANEOUS | 0 refills | Status: DC

## 2019-05-03 MED ORDER — VALACYCLOVIR HCL 1 G PO TABS: 1000.0000 mg | ORAL_TABLET | Freq: Two times a day (BID) | ORAL | 0 refills | Status: AC

## 2019-05-03 MED ORDER — IPRATROPIUM-ALBUTEROL 20-100 MCG/ACT IN AERS: 1.0000 | INHALATION_SPRAY | Freq: Four times a day (QID) | RESPIRATORY_TRACT | 1 refills | Status: DC | PRN

## 2019-05-03 MED ORDER — APIXABAN 5 MG PO TABS: 5.0000 mg | ORAL_TABLET | Freq: Two times a day (BID) | ORAL | 4 refills | Status: DC

## 2019-05-03 NOTE — Plan of Care (Signed)

## 2019-05-03 NOTE — Care Management Important Message (Signed)
Important Message  Patient Details  Name: Dustin Diaz MRN: [id number] Date of Birth: [birth year]   Medicare Important Message Given:  Yes - Important Message mailed due to current National Emergency  Verbal consent obtained due to current National Emergency  Relationship to patient: Spouse/Significant Other Contact Name: Eitan Schoenwetter Call Date: 05/03/19  Time: 1456 Phone: [phone] Outcome: Spoke with contact Important Message mailed to: Other (must enter comment)(patients wife declined an additional copy of IM, but is aware one is available if needed)    Delorse Lek 05/03/2019, 2:57 PM

## 2019-05-03 NOTE — Progress Notes (Signed)
Physical Therapy Treatment Patient Details Name: Dustin Diaz MRN: [id number] DOB: [birth year] Today's Date: 05/03/2019    History of Present Illness 74 year old male admitted with Covid /Pneumonia. PMH includes DM, HTN, hyperlipidemia, polymyalgia rheumatica. 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 He tested as a high fall risk on BERG (36/52), and TUG 18.43 second. He continues to desat to low 70s with unsupported balance testing in standing and sitting (Pt desats in <2 min standing or sitting unsupported). He can recover within 5 minutes to 96%. Today he reported reduced dizziness and lightheadedness during ambulation but was on 4L O2 vs 3 yesterday. He will require O2 upon discharge. Pt educated on scenarios of high fall risk and syncope episodes. Pt verbalized understanding. Due to high fall risk, O2 requirements, muscular wasting (loss of 11kg in less than 1 month) pt continues to be an ideal candidate for 1-2 weeks of CIR before return to home in order to improve BERG balance scores, muscle girth measurements, standing & sitting activity tolerances into functional ranges. If found ineligible home health will be required.   Follow Up Recommendations  Home health PT;CIR(CIR VS HHPT - would do better with CIR)     Equipment Recommendations  (Rollator for endurance limitations since he has access to RW)    Recommendations for Other Services Rehab consult     Precautions / Restrictions Precautions Precautions: Fall;Other (comment) Precaution Comments: Monitor SpO2 Restrictions Weight Bearing Restrictions: No    Mobility  Bed Mobility Overal bed mobility: Modified Independent                Transfers Overall transfer level: Needs assistance Equipment used: Rolling walker (2 wheeled) Transfers: Sit to/from [employer] Transfers Sit to Stand: Supervision Stand pivot transfers: Min  assist;Mod assist       General transfer comment: Pt demonstrates   Ambulation/Gait Ambulation/Gait assistance: Min guard Gait Distance (Feet): 90 Feet Assistive device: Rolling walker (2 wheeled) Gait Pattern/deviations: Step-through pattern Gait velocity: decreased Gait velocity interpretation: <1.8 ft/sec, indicate of risk for recurrent falls General Gait Details: Ambulated 90 ft x1 4L/min [place].   Stairs             Wheelchair Mobility    Modified Rankin (Stroke Patients Only)       Balance Overall balance assessment: Needs assistance Sitting-balance support: Bilateral upper extremity supported Sitting balance-Leahy Scale: Good     Standing balance support: Bilateral upper extremity supported Standing balance-Leahy Scale: Fair                   Standardized Balance Assessment Standardized Balance Assessment : TUG: Timed Up and Go Test;Berg Balance Test Berg Balance Test Sit to Stand: Able to stand using hands after several tries Standing Unsupported: Able to stand 2 minutes with supervision Sitting with Back Unsupported but Feet Supported on Floor or Stool: Able to sit safely and securely 2 minutes Stand to Sit: Uses backs of legs against chair to control descent Transfers: Able to transfer with verbal cueing and /or supervision Standing Unsupported with Eyes Closed: Able to stand 10 seconds with supervision Standing Ubsupported with Feet Together: Able to place feet together independently and stand for 1 minute with supervision From Standing, Reach Forward with Outstretched Arm: Can reach forward >12 cm safely (5") From Standing Position, Pick up Object from Floor: Able to pick up shoe, needs supervision From Standing Position, Turn  to Look Behind Over each Shoulder: Looks behind one side only/other side shows less weight shift Turn 360 Degrees: Able to turn 360 degrees safely but slowly Standing Unsupported, Alternately Place Feet on Step/Stool: Able  to complete 4 steps without aid or supervision Standing Unsupported, One Foot in Front: Needs help to step but can hold 15 seconds Standing on One Leg: Able to lift leg independently and hold 5-10 seconds Total Score: 36   Timed Up and Go Test TUG: Normal TUG Normal TUG (seconds): 18.43(19.35, 18.98, 16.95)    Cognition Arousal/Alertness: Awake/alert Behavior During Therapy: WFL for tasks assessed/performed Overall Cognitive Status: Within Functional Limits for tasks assessed                                 General Comments: Pt continues to demonstrate increased willingness to participate in therapy session.      Exercises General Exercises - Lower Extremity Ankle Circles/Pumps: AROM;Both;10 reps Long Arc Quad: AROM;Both;10 reps Hip Flexion/Marching: AROM;Both;10 reps Other Exercises Other Exercises: Encouraged pursed lip breathing throughout    General Comments General comments (skin integrity, edema, etc.): Pt demonstrated desat to 76 during static balance activities. Pt was able to recover within 2 min to 90% O2 on 4L [place]., 4 min to recover to 97% O2.  Pt required rest breaks every 2 moves for Berg balance testing desating to 76 during any standing balance activities.       Pertinent Vitals/Pain Pain Assessment: No/denies pain Pain Score: 0-No pain    Home Living                      Prior Function            PT Goals (current goals can now be found in the care plan section) Acute Rehab PT Goals PT Goal Formulation: With patient Time For Goal Achievement: 05/08/19 Potential to Achieve Goals: Fair Progress towards PT goals: Progressing toward goals    Frequency    Min 3X/week      PT Plan Discharge plan needs to be updated    Co-evaluation              AM-PAC PT "6 Clicks" Mobility   Outcome Measure  Help needed turning from your back to your side while in a flat bed without using bedrails?: None Help needed moving from  lying on your back to sitting on the side of a flat bed without using bedrails?: None Help needed moving to and from a bed to a chair (including a wheelchair)?: A Little Help needed standing up from a chair using your arms (e.g., wheelchair or bedside chair)?: A Little Help needed to walk in hospital room?: A Little Help needed climbing 3-5 steps with a railing? : A Lot 6 Click Score: 19    End of Session Equipment Utilized During Treatment: Oxygen;Gait belt;Other (comment)(RW) Activity Tolerance: Patient limited by fatigue Patient left: in chair;with call bell/phone within reach;with nursing/sitter in room Nurse Communication: Mobility status PT Visit Diagnosis: Unsteadiness on feet (R26.81);Other abnormalities of gait and mobility (R26.89);Muscle weakness (generalized) (M62.81)     Time: [id number] PT Time Calculation (min) (ACUTE ONLY): 73 min  Charges:  $Gait Training: 8-22 mins $Therapeutic Exercise: 23-37 mins $Therapeutic Activity: 23-37 mins                     Ann Held PT, DPT [place]  P: [phone]    Konner Saiz A Rylin Seavey 05/03/2019, 10:58 AM

## 2019-05-03 NOTE — Discharge Summary (Addendum)
Discharge Summary  Dustin Diaz [id number] DOB: 1945-05-15  PCP: Lavone Orn, MD  Admit date: 04/05/2019 Discharge date: 05/03/2019  Time spent: 35 minutes  Recommendations for Outpatient Follow-up:  1. Patient going home with home oxygen 2 L at rest, 3-4 with exertion 2. Patient going home with full home health services 3. New medication: Zovirax ointment 5% apply topically 4 times a day to affected lesions, continue until tube is emptied 4. New medication: Eliquis 5 mg p.o. twice daily to continue for the next 5 months 5. New medication: Combivent 1 puff every 6 hours as needed 6. New medication: Valacyclovir 1000 mg twice a day until end of 2/14 7. Medication change: Flomax which is temporarily on hold, patient had stopped this medication prior to coming in as he had not been feeling well while taking it.  He plans to resume this once he feels better. 8. Medication change: Exforge 5/160, on hold due to dizziness and lower blood pressures.  To be resumed once patient is feeling better and pressures have trended upward 9. Patient will follow up with his PCP in the next 2 weeks 10. Patient will need a repeat CT scan of chest in the next 3 to 6 months to follow-up on hilar lymphadenopathy noted on CT during his hospitalization. 11. Patient will need an outpatient MRI of the abdomen/pelvis to follow-up on questionable pancreatic cyst seen on CT. 12. Please note that patient has completed his 21-day course following positive Covid test in the hospital.  He does not need to be on any kind of isolation at this time.  Discharge Diagnoses:  Active Hospital Problems   Diagnosis Date Noted  . Acute on chronic respiratory failure with hypoxia ([place]) 05/02/2019  . Acute deep vein thrombosis (DVT) of left lower extremity ([place]) 05/02/2019  . 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 Abnormal MRI of abdomen 05/02/2019  . Hilar lymphadenopathy 05/02/2019  . Pneumonia due to  COVID-19 virus 04/05/2019  . Hypertension 07/24/2012  . Diabetes ([place]) 07/24/2012  . High cholesterol 07/24/2012  . Polymyalgia rheumatica ([place]) 07/24/2012    Resolved Hospital Problems  No resolved problems to display.    Discharge Condition: Improved, being discharged home with home health  Diet recommendation: Heart healthy, carb modified  Vitals:   05/03/19 0800 05/03/19 1105  BP: 117/71 127/70  Pulse: 73 79  Resp: 18 20  Temp: 97.9 F (36.6 C) 98.5 F (36.9 C)  SpO2: 96% 94%    History of present illness:  Dustin Diaz a 74 y.o.malewith past medical history remarkable for of DM-2, HTN, PMR (on chronic steroids), HLD, who presented to the hospital on 1/14 with shortness of breath-was found to have acute hypoxic respiratory failure secondary to COVID-19 pneumonia.   Patient treated with 5-day course of IV Remdisivir and high-dose IV steroids and since then has been tapered back to his home dose.  Hospital course has been complicated by lower extremity DVT, ulcerated skin lesion status post biopsy found to be consistent with HSV-1 infection and slow improvement in hypoxia.  Hospital Course:  Principal Problem:   Acute on chronic respiratory failure with hypoxia (HCC) secondary to COVID-19 viral pneumonia: Status post Actemra x2 doses, 5-day course of IV Remdisivir and high-dose steroids.  Steroids now weaned down to home dose.  He has minimal hypoxia and symptoms at rest, getting by on 2 L.  However, even with mild ambulation requires significantly more oxygen, up to 4 L.  Not felt to  be a candidate for  inpatient rehab.  Will go home with home oxygen plus home health services.    Note that patient has been in the hospital for more than 21 days since positive Covid test that he is no longer felt to be contagious.  He does not need to be in isolation.  Furthermore, he is advised to get the vaccine once it is available to him, no earlier than 2/28, 45 days from positive  Covid test.  Active Problems:   Hypertension: Has some dizziness and so his oral medication home regimen was discontinued.  Blood pressures have continued remained stable off of medication.  His blood pressures will be watched of pressure trend back up after he is home, we can resume his Exforge.    Diabetes ([place]), well controlled.  While inpatient, holding home oral regimen.  On Lantus 8 units subcu daily while in hospital.  CBGs have been relatively stable with most CBGs below 200.  Resume home medications upon discharge.  Hyperlipidemia: Continue statin.    Polymyalgia rheumatica ([place]): On daily dose of prednisone 3 mg.    Acute deep vein thrombosis (DVT) of left lower extremity ([place]): CTA negative on 1/18 for PE.  On Eliquis.  Will need this for 6 months.    HSV-1 (herpes simplex virus 1) infection: Patient developed nodular cavitary skin lesions to his neck, left and occipital area during hospitalization which were initially thought to be secondary to fungal infection, however he underwent punch biopsy by general surgery and pathology came back notable for HSV 1 infection.  Infectious disease consulted and patient was started on IV acyclovir and now the lesions have crusted over, he has been changed over to oral valacyclovir for total course of 14 days.  He will finish this on 2/14.  Has also been getting topical Zovirax.    Abnormal CT of abdomen: Questionable pancreatic cyst seen on CT.  Patient will need outpatient MRI of the abdomen pelvis    Hilar lymphadenopathy: Incidentally noted on CT scan.  Secondary to acute infectious process with Covid most likely versus HSV infection.  Repeat imaging in 3 to 6 months as an outpatient.  Right leg little kid  Consultants:  General Surgery  Infectious Disease   Procedures:  S/p punch biopsy    Discharge Exam: BP 127/70 (BP Location: Right Arm)   Pulse 79   Temp 98.5 F (36.9 C) (Oral)   Resp 20   Ht 5\' 10"  (1.778 m)   Wt 67  kg   SpO2 94%   BMI 21.19 kg/m   General: Alert and oriented x3, no acute distress, fatigued Cardiovascular: Regular rate and rhythm, S1-S2 Respiratory: Clear to auscultation bilaterally  Discharge Instructions You were cared for by a hospitalist during your hospital stay. If you have any questions about your discharge medications or the care you received while you were in the hospital after you are discharged, you can call the unit and asked to speak with the hospitalist on call if the hospitalist that took care of you is not available. Once you are discharged, your primary care physician will handle any further medical issues. Please note that NO REFILLS for any discharge medications will be authorized once you are discharged, as it is imperative that you return to your primary care physician (or establish a relationship with a primary care physician if you do not have one) for your aftercare needs so that they can reassess your need for medications and monitor your lab  values.  Discharge Instructions    Diet - low sodium heart healthy   Complete by: As directed    Increase activity slowly   Complete by: As directed      Allergies as of 05/03/2019      Reactions   Codeine       Medication List    STOP taking these medications   amLODipine-valsartan 5-160 MG tablet Commonly known as: EXFORGE   tamsulosin 0.4 MG Caps capsule Commonly known as: FLOMAX     TAKE these medications   acyclovir ointment 5 % Commonly known as: ZOVIRAX Apply topically in the morning, at noon, in the evening, and at bedtime.   apixaban 5 MG Tabs tablet Commonly known as: ELIQUIS Take 1 tablet (5 mg total) by mouth 2 (two) times daily.   aspirin 81 MG tablet Take 81 mg by mouth daily.   atorvastatin 10 MG tablet Commonly known as: LIPITOR Take 10 mg by mouth daily.   feeding supplement (ENSURE ENLIVE) Liqd Take 237 mLs by mouth 2 (two) times daily between meals.   fluticasone 50 MCG/ACT  nasal spray Commonly known as: FLONASE Place 2 sprays into the nose daily as needed for allergies.   glimepiride 2 MG tablet Commonly known as: AMARYL Take 2 mg by mouth every morning.   guaiFENesin-dextromethorphan 100-10 MG/5ML syrup Commonly known as: ROBITUSSIN DM Take 10 mLs by mouth every 4 (four) hours as needed for cough.   Ipratropium-Albuterol 20-100 MCG/ACT Aers respimat Commonly known as: COMBIVENT Inhale 1 puff into the lungs every 6 (six) hours as needed for wheezing.   Jardiance 25 MG Tabs tablet Generic drug: empagliflozin Take 25 mg by mouth every morning.   latanoprost 0.005 % ophthalmic solution Commonly known as: XALATAN Place 1 drop into both eyes at bedtime.   metFORMIN 500 MG 24 hr tablet Commonly known as: GLUCOPHAGE-XR Take 2,000 mg by mouth every morning.   pioglitazone 45 MG tablet Commonly known as: ACTOS Take 45 mg by mouth daily.   predniSONE 1 MG tablet Commonly known as: DELTASONE Take 3 mg by mouth daily.   valACYclovir 1000 MG tablet Commonly known as: VALTREX Take 1 tablet (1,000 mg total) by mouth 2 (two) times daily for 7 doses.            Durable Medical Equipment  (From admission, onward)         Start     Ordered   05/01/19 1119  For home use only DME oxygen  Once    Comments: 2 L at rest, 6 L with exertion  Question Answer Comment  Length of Need 12 Months   Mode or (Route) Nasal cannula   Liters per Minute 6   Frequency Continuous (stationary and portable oxygen unit needed)   Oxygen delivery system Gas      05/01/19 1120   04/25/19 1412  For home use only DME Walker rolling  Once    Question Answer Comment  Walker: With [place]   Patient needs a walker to treat with the following condition Gait abnormality      04/25/19 1413         Allergies  Allergen Reactions  . Codeine    Follow-up Information    [place], [facility] Follow up.   Why: agency will provide home health  physical therapy (formerly advance home care). agency will call you to schedule first visit.  Contact information: [place] [address] [phone]  [facility] Follow up.   Why: agency will provide home oxygen Contact information: [address] [place] [address] [phone]            The results of significant diagnostics from this hospitalization (including imaging, microbiology, ancillary and laboratory) are listed below for reference.    Significant Diagnostic Studies: CT HEAD W & WO CONTRAST  Result Date: 04/20/2019 CLINICAL DATA:  Suspected sinusitis EXAM: CT HEAD WITHOUT AND WITH CONTRAST TECHNIQUE: Contiguous axial images were obtained from the base of the skull through the vertex without and with intravenous contrast CONTRAST:  15mL OMNIPAQUE IOHEXOL 350 MG/ML SOLN COMPARISON:  None. FINDINGS: Brain: There is no acute intracranial hemorrhage, mass-effect, or edema. Gray-white differentiation is preserved. Patchy hypoattenuation in the supratentorial white matter is nonspecific but may reflect mild chronic microvascular ischemic changes. There is no extra-axial fluid collection. Ventricles and sulci are within normal limits in size and configuration. Vascular: There is atherosclerotic calcification at the skull base. Skull: Calvarium is unremarkable. Sinuses/Orbits: Minimal patchy retained secretions left ethmoid sinuses. Otherwise trace paranasal sinus mucosal thickening. There is evidence chronic left maxillary sinus inflammation. Orbits are unremarkable. Other: None. IMPRESSION: No acute intracranial abnormality. Mild chronic microvascular ischemic changes. No significant paranasal sinus opacification. Electronically Signed   By: Macy Mis M.D.   On: 04/20/2019 14:32   CT CHEST W CONTRAST  Result Date: 04/20/2019 CLINICAL DATA:  Acute hypoxic respiratory failure due to COVID pneumonia. Febrile illness. EXAM: CT CHEST,  ABDOMEN, AND PELVIS WITH CONTRAST TECHNIQUE: Multidetector CT imaging of the chest, abdomen and pelvis was performed following the standard protocol during bolus administration of intravenous contrast. CONTRAST:  118mL OMNIPAQUE IOHEXOL 350 MG/ML SOLN COMPARISON:  Chest CT 04/09/2019 FINDINGS: CT CHEST FINDINGS Cardiovascular: The heart is mildly enlarged but stable. Stable tortuosity, ectasia and calcification of the thoracic aorta. Stable scattered coronary artery calcifications. Mediastinum/Nodes: Persistent scattered mediastinal and hilar lymph nodes. The esophagus is grossly normal. Lungs/Pleura: Exam limited by breathing motion artifact but there is persistent diffuse interstitial and airspace disease in both lungs. The infiltrates demonstrate progressive consolidation versus the more ground-glass appearance on the prior study. No definite pleural effusions or pleural lesions. No pneumothorax. Musculoskeletal: No significant findings. CT ABDOMEN PELVIS FINDINGS Examination is limited due to breathing motion artifact. Hepatobiliary: No focal hepatic lesions or intrahepatic biliary dilatation. The gallbladder is normal. No common bile duct dilatation. Pancreas: Small cystic area noted in the pancreatic head which is likely a benign postinflammatory cyst. There is a second exophytic cyst projecting off the body tail junction region. No findings for acute pancreatitis and no ductal dilatation. Spleen: Normal size. No focal lesions. Adrenals/Urinary Tract: The adrenal glands and kidneys are grossly normal. Small left renal cyst noted. No hydronephrosis. The bladder is unremarkable. Stomach/Bowel: The stomach, duodenum, small bowel and colon are grossly normal. No acute inflammatory changes, mass lesions or obstructive findings. Moderate stool throughout the colon and down into the rectum suggesting constipation Vascular/Lymphatic: Moderate atherosclerotic calcifications involving the aorta and branch vessels but no  aneurysm. No mesenteric or retroperitoneal mass or adenopathy. Reproductive: Mild prostate gland enlargement. The seminal vesicles appear normal. Other: No pelvic mass or free pelvic fluid collections. No inguinal mass or adenopathy. Musculoskeletal: No significant bony findings. IMPRESSION: 1. Persistent diffuse interstitial and airspace disease in both lungs. The infiltrates demonstrate progressive consolidation versus the more ground-glass appearance on the prior study. 2. Stable mediastinal and hilar lymph nodes. 3. No acute abdominal/pelvic findings, mass lesions  or adenopathy. 4. Cystic lesions in the pancreas, likely benign postinflammatory cysts but will need follow-up once the patient is no longer acutely ill. MRI of the abdomen would be helpful for further evaluation if possible at some point. 5. Moderate stool throughout the colon and down into the rectum suggesting constipation. Electronically Signed   By: Marijo Sanes M.D.   On: 04/20/2019 14:30   CT ANGIO CHEST PE W OR WO CONTRAST  Result Date: 04/09/2019 CLINICAL DATA:  COVID pneumonia, hypoxia, shortness of breath and elevated D-dimer. EXAM: CT ANGIOGRAPHY CHEST WITH CONTRAST TECHNIQUE: Multidetector CT imaging of the chest was performed using the standard protocol during bolus administration of intravenous contrast. Multiplanar CT image reconstructions and MIPs were obtained to evaluate the vascular anatomy. CONTRAST:  76mL OMNIPAQUE IOHEXOL 350 MG/ML SOLN COMPARISON:  Chest x-ray on 04/05/2019 FINDINGS: Cardiovascular: The pulmonary arteries are well opacified. There is no evidence pulmonary embolism. Central pulmonary arteries are normal in caliber. The heart size is normal. The thoracic aorta is normal in caliber. Trace amount of pericardial fluid. Calcified plaque is noted of the coronary arteries primarily in the distribution of the LAD. Mediastinum/Nodes: Mildly prominent right hilar nodal tissue measuring up to 1.5 cm. No evidence of  enlarged mediastinal or axillary lymph nodes. Lungs/Pleura: Severe airspace disease is seen throughout all lobes of both lungs in a ground-glass pattern. There is an associated very small right pleural effusion. No pneumothorax. Upper Abdomen: No acute abnormality. Musculoskeletal: No chest wall abnormality. No acute or significant osseous findings. Review of the MIP images confirms the above findings. IMPRESSION: 1. No evidence of pulmonary embolism. 2. Severe airspace disease throughout all lobes of both lungs in a ground-glass pattern. Findings are consistent with severe bilateral COVID-19 pneumonia. 3. Mildly prominent right hilar nodal tissue measuring up to 1.5 cm in short axis. This is likely reactive. 4. Coronary artery disease with calcified plaque in the distribution of the LAD. Electronically Signed   By: Aletta Edouard M.D.   On: 04/09/2019 08:58   CT ABDOMEN PELVIS W CONTRAST  Result Date: 04/20/2019 CLINICAL DATA:  Acute hypoxic respiratory failure due to COVID pneumonia. Febrile illness. EXAM: CT CHEST, ABDOMEN, AND PELVIS WITH CONTRAST TECHNIQUE: Multidetector CT imaging of the chest, abdomen and pelvis was performed following the standard protocol during bolus administration of intravenous contrast. CONTRAST:  157mL OMNIPAQUE IOHEXOL 350 MG/ML SOLN COMPARISON:  Chest CT 04/09/2019 FINDINGS: CT CHEST FINDINGS Cardiovascular: The heart is mildly enlarged but stable. Stable tortuosity, ectasia and calcification of the thoracic aorta. Stable scattered coronary artery calcifications. Mediastinum/Nodes: Persistent scattered mediastinal and hilar lymph nodes. The esophagus is grossly normal. Lungs/Pleura: Exam limited by breathing motion artifact but there is persistent diffuse interstitial and airspace disease in both lungs. The infiltrates demonstrate progressive consolidation versus the more ground-glass appearance on the prior study. No definite pleural effusions or pleural lesions. No  pneumothorax. Musculoskeletal: No significant findings. CT ABDOMEN PELVIS FINDINGS Examination is limited due to breathing motion artifact. Hepatobiliary: No focal hepatic lesions or intrahepatic biliary dilatation. The gallbladder is normal. No common bile duct dilatation. Pancreas: Small cystic area noted in the pancreatic head which is likely a benign postinflammatory cyst. There is a second exophytic cyst projecting off the body tail junction region. No findings for acute pancreatitis and no ductal dilatation. Spleen: Normal size. No focal lesions. Adrenals/Urinary Tract: The adrenal glands and kidneys are grossly normal. Small left renal cyst noted. No hydronephrosis. The bladder is unremarkable. Stomach/Bowel: The stomach, duodenum, small bowel  and colon are grossly normal. No acute inflammatory changes, mass lesions or obstructive findings. Moderate stool throughout the colon and down into the rectum suggesting constipation Vascular/Lymphatic: Moderate atherosclerotic calcifications involving the aorta and branch vessels but no aneurysm. No mesenteric or retroperitoneal mass or adenopathy. Reproductive: Mild prostate gland enlargement. The seminal vesicles appear normal. Other: No pelvic mass or free pelvic fluid collections. No inguinal mass or adenopathy. Musculoskeletal: No significant bony findings. IMPRESSION: 1. Persistent diffuse interstitial and airspace disease in both lungs. The infiltrates demonstrate progressive consolidation versus the more ground-glass appearance on the prior study. 2. Stable mediastinal and hilar lymph nodes. 3. No acute abdominal/pelvic findings, mass lesions or adenopathy. 4. Cystic lesions in the pancreas, likely benign postinflammatory cysts but will need follow-up once the patient is no longer acutely ill. MRI of the abdomen would be helpful for further evaluation if possible at some point. 5. Moderate stool throughout the colon and down into the rectum suggesting  constipation. Electronically Signed   By: Marijo Sanes M.D.   On: 04/20/2019 14:30   DG CHEST PORT 1 VIEW  Result Date: 04/26/2019 CLINICAL DATA:  Shortness of breath. EXAM: PORTABLE CHEST 1 VIEW COMPARISON:  04/20/2019. FINDINGS: Left lower lateral portion of the chest not imaged. Heart size stable. Prominent multifocal bilateral pulmonary infiltrates are again noted without significant interim change. Moderate right pleural effusion noted on today's exam. No pneumothorax. No acute bony abnormality. IMPRESSION: Prominent multifocal bilateral pulmonary infiltrates are again noted without significant interim change. Electronically Signed   By: Marcello Moores  Register   On: 04/26/2019 08:41   DG Chest Port 1 View  Result Date: 04/16/2019 CLINICAL DATA:  74 year old male with a history of COVID pneumonia EXAM: PORTABLE CHEST 1 VIEW COMPARISON:  CT 04/09/2019, plain film 04/05/2019 FINDINGS: Cardiomediastinal silhouette unchanged in size and contour. Similar appearance of low lung volumes and mixed airspace and interstitial opacities of the bilateral lungs. No pneumothorax or pleural effusion. IMPRESSION: Unchanged appearance of the lungs with mixed interstitial and airspace disease bilateral compatible with multifocal pneumonia Electronically Signed   By: Corrie Mckusick D.O.   On: 04/16/2019 07:48   DG Chest Port 1 View  Result Date: 04/05/2019 CLINICAL DATA:  COVID positive low oxygen level EXAM: PORTABLE CHEST 1 VIEW COMPARISON:  None. FINDINGS: Bilateral ground-glass opacities and consolidations. No pleural effusion. Normal heart size. Aortic atherosclerosis. No pneumothorax. IMPRESSION: Bilateral ground-glass opacities and consolidations consistent with bilateral pneumonia. Electronically Signed   By: Donavan Foil M.D.   On: 04/05/2019 16:47   VAS [place] LOWER EXTREMITY VENOUS (DVT)  Result Date: 04/09/2019  Lower Venous Study Indications: Covid positive with worsening positive d-Dimer and hypoxia.   Anticoagulation: Lovenox. Comparison Study: No priors. Performing Technologist: Oda Cogan RDMS, RVT  Examination Guidelines: A complete evaluation includes B-mode imaging, spectral Doppler, color Doppler, and power Doppler as needed of all accessible portions of each vessel. Bilateral testing is considered an integral part of a complete examination. Limited examinations for reoccurring indications may be performed as noted.  +---------+---------------+---------+-----------+----------+--------------+ RIGHT    CompressibilityPhasicitySpontaneityPropertiesThrombus Aging +---------+---------------+---------+-----------+----------+--------------+ CFV      Full           Yes      Yes                                 +---------+---------------+---------+-----------+----------+--------------+ SFJ      Full                                                        +---------+---------------+---------+-----------+----------+--------------+  FV Prox  Full                                                        +---------+---------------+---------+-----------+----------+--------------+ FV Mid   Full                                                        +---------+---------------+---------+-----------+----------+--------------+ FV DistalFull                                                        +---------+---------------+---------+-----------+----------+--------------+ PFV      Full                                                        +---------+---------------+---------+-----------+----------+--------------+ POP      Full           Yes      Yes                                 +---------+---------------+---------+-----------+----------+--------------+ PTV      Full                                                        +---------+---------------+---------+-----------+----------+--------------+ PERO     Full                                                         +---------+---------------+---------+-----------+----------+--------------+   +---------+---------------+---------+-----------+----------+--------------+ LEFT     CompressibilityPhasicitySpontaneityPropertiesThrombus Aging +---------+---------------+---------+-----------+----------+--------------+ CFV      Full           Yes      Yes                                 +---------+---------------+---------+-----------+----------+--------------+ SFJ      Full                                                        +---------+---------------+---------+-----------+----------+--------------+ FV Prox  Full                                                        +---------+---------------+---------+-----------+----------+--------------+  FV Mid   Full                                                        +---------+---------------+---------+-----------+----------+--------------+ FV DistalFull                                                        +---------+---------------+---------+-----------+----------+--------------+ PFV      Full                                                        +---------+---------------+---------+-----------+----------+--------------+ POP      Full           Yes      Yes                                 +---------+---------------+---------+-----------+----------+--------------+ PTV      Full                                                        +---------+---------------+---------+-----------+----------+--------------+ PERO     Partial                                      Acute          +---------+---------------+---------+-----------+----------+--------------+ Soleal   Full                                         Acute          +---------+---------------+---------+-----------+----------+--------------+     Summary: Right: There is no evidence of deep vein thrombosis in the lower extremity. No cystic structure found in  the popliteal fossa. Left: Findings consistent with acute deep vein thrombosis involving the left peroneal veins, and left soleal veins. Short segment of localized thrombus in the peroneal and soleal veins in the mid segment.  *See table(s) above for measurements and observations. Electronically signed by Ruta Hinds MD on 04/09/2019 at 2:56:25 PM.    Final     Microbiology: No results found for this or any previous visit (from the past 240 hour(s)).   Labs: Basic Metabolic Panel: Recent Labs  Lab 04/27/19 0421 04/29/19 0535  NA 138 137  K 4.5 3.6  CL 101 97*  CO2 30 30  GLUCOSE 150* 131*  BUN 30* 26*  CREATININE 1.12 0.91  CALCIUM 8.5* 8.8*   Liver Function Tests: No results for input(s): AST, ALT, ALKPHOS, BILITOT, PROT, ALBUMIN in the last 168 hours. No results for input(s): LIPASE, AMYLASE in the last 168 hours. No results for input(s): AMMONIA in the last 168 hours. CBC:  Recent Labs  Lab 04/27/19 0421  WBC 8.5  HGB 12.7*  HCT 39.7  MCV 93.2  PLT 213   Cardiac Enzymes: No results for input(s): CKTOTAL, CKMB, CKMBINDEX, TROPONINI in the last 168 hours. BNP: BNP (last 3 results) Recent Labs    04/23/19 0348 04/24/19 0535 04/25/19 0447  BNP 41.0 40.2 43.0    ProBNP (last 3 results) No results for input(s): PROBNP in the last 8760 hours.  CBG: Recent Labs  Lab 05/02/19 1206 05/02/19 1617 05/02/19 2043 05/03/19 0809 05/03/19 1221  GLUCAP 135* 220* 193* 124* 138*       Signed:  Annita Brod, MD [facility] 05/03/2019, 2:24 PM

## 2019-05-03 NOTE — Discharge Instructions (Signed)
You do not need to isolate.  You are already past 21 days from your positive Covid test.  You should get the Covid vaccine when it is available to you, no earlier than 2/28 which is 45 days from your positive Covid test.  You need to have a repeat CT scan done of your chest in 3 to 6 months to follow-up on swollen lymph nodes.  Your primary care doctor is aware of this and will help coordinate this.  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  Your primary care doctor is aware of this and will coordinate this.  information on my medicine - ELIQUIS (apixaban)  This medication education was reviewed with me or my healthcare representative as part of my discharge preparation. \ Why was Eliquis prescribed for you? Eliquis was prescribed to treat blood clots that may have been found in the veins of your legs (deep vein thrombosis) or in your lungs (pulmonary embolism) and to reduce the risk of them occurring again.  What do You need to know about Eliquis ? The starting dose is 10 mg (two 5 mg tablets) taken TWICE daily for the FIRST SEVEN (7) DAYS, then on Monday, 1/25 evening  the dose is reduced to ONE 5 mg tablet taken TWICE daily.  Eliquis may be taken with or without food.   Try to take the dose about the same time in the morning and in the evening. If you have difficulty swallowing the tablet whole please discuss with your pharmacist how to take the medication safely.  Take Eliquis exactly as prescribed and DO NOT stop taking Eliquis without talking to the doctor who prescribed the medication.  Stopping may increase your risk of developing a new blood clot.  Refill your prescription before you run out.  After discharge, you should have regular check-up appointments with your healthcare provider that is prescribing your Eliquis.    What do you do if you miss a dose? If a dose of ELIQUIS is not taken at the scheduled time, take it as  soon as possible on the same day and twice-daily administration should be resumed. The dose should not be doubled to make up for a missed dose.  Important Safety Information A possible side effect of Eliquis is bleeding. You should call your healthcare provider right away if you experience any of the following: ? Bleeding from an injury or your nose that does not stop. ? Unusual colored urine (red or dark brown) or unusual colored stools (red or black). ? Unusual bruising for unknown reasons. ? A serious fall or if you hit your head (even if there is no bleeding).  Some medicines may interact with Eliquis and might increase your risk of bleeding or clotting while on Eliquis. To help avoid this, consult your healthcare provider or pharmacist prior to using any new prescription or non-prescription medications, including herbals, vitamins, non-steroidal anti-inflammatory drugs (NSAIDs) and supplements.  This website has more information on Eliquis (apixaban): http://www.eliquis.com/eliquis/home

## 2019-05-03 NOTE — TOC Transition Note (Signed)
Transition of Care [facility]) - CM/SW Discharge Note   Patient Details  Name: HANIF YANNUZZI MRN: [id number] Date of Birth: 1945-04-18  [facility] CM/SW Contact:  Leeroy Cha, RN Phone Number: 05/03/2019, 2:09 PM   Clinical Narrative:    [id number]/spoke with the wife, o2 and hhc are set up.  Expecting patient to be dcd today.     Barriers to Discharge: Continued Medical Work up   Patient Goals and CMS Choice Patient states their goals for this hospitalization and ongoing recovery are:: to go home with therapy CMS Medicare.gov Compare Post Acute Care list provided to:: Patient Represenative (must comment) Choice offered to / list presented to : Spouse  Discharge Placement                       Discharge Plan and Services   Discharge Planning Services: CM Consult Post Acute Care Choice: Home Health, Durable Medical Equipment          DME Arranged: Oxygen DME Agency: [employer] Date DME Agency Contacted: 05/01/19 Time DME Agency Contacted: [id number] Representative spoke with at DME Agency: [place]: PT [place]: [place] ([place]) Date [place]: 05/01/19 Time [place]: [place] Representative spoke with at [place]: [place] ([place]) Interventions     Readmission Risk Interventions Readmission Risk Prevention Plan 05/01/2019  Transportation Screening Complete  HRI or [place] Complete  Social Work Consult for [place] Planning/Counseling Complete  Palliative Care Screening Not Applicable  Medication Review Press photographer) Complete  Some recent data might be hidden

## 2019-05-03 NOTE — Progress Notes (Signed)
Occupational Therapy Treatment Patient Details Name: Dustin Diaz MRN: [id number] DOB: 1945-05-26 Today's Date: 05/03/2019    History of present illness 74 year old male admitted with Covid /Pneumonia. PMH includes DM, HTN, hyperlipidemia, polymyalgia rheumatica. 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 Educated/instructed pt on seated level I theraband HEP with good understanding and follow through. Pt required min to mod cues and demo on technique. Pt required mod rest breaks throughout due to fatigue and shortness of breath. Pt on 4L [place] with SpO2 dropping to 84% following each exercise. Pt required ~2 min seated recovery to return back into the 90s. Pt demo good use of pursed lip breathing techniques. Pt required increased time to complete HEP due to frequent rest breaks. OT will continue to follow acutely.    Follow Up Recommendations  CIR - If denied, pt will require HH OT and 24/7 assist.    Equipment Recommendations  None recommended by OT    Recommendations for Other Services      Precautions / Restrictions Precautions Precautions: Fall;Other (comment) Precaution Comments: Monitor SpO2 Restrictions Weight Bearing Restrictions: No       Mobility Bed Mobility               General bed mobility comments: Pt seated in bedside chair upon OT arrival.   Transfers                      Balance                                           ADL either performed or assessed with clinical judgement   ADL                                               Vision       Perception     Praxis      Cognition Arousal/Alertness: Awake/alert Behavior During Therapy: WFL for tasks assessed/performed Overall Cognitive Status: Within Functional Limits for tasks assessed                                          Exercises Exercises: Other exercises Other  Exercises Other Exercises: Encouraged pursed lip breathing throughout Other Exercises: Educated/instructed pt on level I theraband HEP with pt requiring min to mod cues on technique.    Shoulder Instructions       General Comments Educated/instructed pt on level I theraband HEP with good understanding and follow through. Pt on 4L [place] with SpO2 dropping to mid 80s with activity. Pt continues to require ~2 min seated recovery to return to 90s.     Pertinent Vitals/ Pain       Pain Assessment: No/denies pain  Home Living                                          Prior Functioning/Environment              Frequency  Progress Toward Goals  OT Goals(current goals can now be found in the care plan section)  Progress towards OT goals: Progressing toward goals  ADL Goals Additional ADL Goal #1: Pt will complete ADL task with SpO2 maintianing above 88 independently using pursed lip breathing techniques Additional ADL Goal #2: Pt will independently verbalize 3 energy conservation strategies Additional ADL Goal #3: Pt to tolerate standing up to 10 min with modified independence, in preparation for ADLs.  Plan Discharge plan remains appropriate    Co-evaluation                 AM-PAC OT "6 Clicks" Daily Activity     Outcome Measure   Help from another person eating meals?: None Help from another person taking care of personal grooming?: A Little Help from another person toileting, which includes using toliet, bedpan, or urinal?: A Little Help from another person bathing (including washing, rinsing, drying)?: A Little Help from another person to put on and taking off regular upper body clothing?: A Little Help from another person to put on and taking off regular lower body clothing?: A Little 6 Click Score: 19    End of Session Equipment Utilized During Treatment: Oxygen  OT Visit Diagnosis: Unsteadiness on feet (R26.81);Muscle weakness  (generalized) (M62.81)   Activity Tolerance Patient limited by fatigue(Limited by SOB)   Patient Left in chair;with call bell/phone within reach   Nurse Communication Mobility status        Time: [id number] OT Time Calculation (min): 47 min  Charges: OT General Charges $OT Visit: 1 Visit OT Treatments $Therapeutic Exercise: 38-52 mins  Mauri Brooklyn OTR/L [phone]   Mauri Brooklyn 05/03/2019, 3:16 PM

## 2019-05-03 NOTE — Progress Notes (Signed)
Inpatient Rehabilitation-Admissions Coordinator   Received call from PT yesterday asking for [facility] to re consider CIR for this patient. Discussed case with PM&R MD Dr. Naaman Plummer this morning to see if CIR can be justified. Given pt's current functional status, pt does not require an IP Rehab stay. It is also unlikely the patient's insurance company would approve IP Rehab with current medical and functional status.   [facility] will not pursue CIR for this patient.   Raechel Ache, OTR/L  Rehab Admissions Coordinator  [phone] 05/03/2019 11:08 AM

## 2019-05-07 DIAGNOSIS — I10 Essential (primary) hypertension: Secondary | ICD-10-CM | POA: Diagnosis not present

## 2019-05-07 DIAGNOSIS — M353 Polymyalgia rheumatica: Secondary | ICD-10-CM | POA: Diagnosis not present

## 2019-05-07 DIAGNOSIS — B009 Herpesviral infection, unspecified: Secondary | ICD-10-CM | POA: Diagnosis not present

## 2019-05-07 DIAGNOSIS — I82402 Acute embolism and thrombosis of unspecified deep veins of left lower extremity: Secondary | ICD-10-CM | POA: Diagnosis not present

## 2019-05-07 DIAGNOSIS — E7800 Pure hypercholesterolemia, unspecified: Secondary | ICD-10-CM | POA: Diagnosis not present

## 2019-05-07 DIAGNOSIS — U071 COVID-19: Secondary | ICD-10-CM | POA: Diagnosis not present

## 2019-05-07 DIAGNOSIS — E119 Type 2 diabetes mellitus without complications: Secondary | ICD-10-CM | POA: Diagnosis not present

## 2019-05-07 DIAGNOSIS — J1282 Pneumonia due to coronavirus disease 2019: Secondary | ICD-10-CM | POA: Diagnosis not present

## 2019-05-07 DIAGNOSIS — J9621 Acute and chronic respiratory failure with hypoxia: Secondary | ICD-10-CM | POA: Diagnosis not present

## 2019-05-07 DIAGNOSIS — Z9181 History of falling: Secondary | ICD-10-CM | POA: Diagnosis not present

## 2019-05-08 LAB — BLASTOMYCES ANTIGEN: Blastomyces Antigen: NOT DETECTED ng/mL

## 2019-05-11 DIAGNOSIS — K862 Cyst of pancreas: Secondary | ICD-10-CM | POA: Diagnosis not present

## 2019-05-11 DIAGNOSIS — I10 Essential (primary) hypertension: Secondary | ICD-10-CM | POA: Diagnosis not present

## 2019-05-11 DIAGNOSIS — E113293 Type 2 diabetes mellitus with mild nonproliferative diabetic retinopathy without macular edema, bilateral: Secondary | ICD-10-CM | POA: Diagnosis not present

## 2019-05-11 DIAGNOSIS — U071 COVID-19: Secondary | ICD-10-CM | POA: Diagnosis not present

## 2019-05-11 DIAGNOSIS — B009 Herpesviral infection, unspecified: Secondary | ICD-10-CM | POA: Diagnosis not present

## 2019-05-12 DIAGNOSIS — E7800 Pure hypercholesterolemia, unspecified: Secondary | ICD-10-CM | POA: Diagnosis not present

## 2019-05-12 DIAGNOSIS — M353 Polymyalgia rheumatica: Secondary | ICD-10-CM | POA: Diagnosis not present

## 2019-05-12 DIAGNOSIS — U071 COVID-19: Secondary | ICD-10-CM | POA: Diagnosis not present

## 2019-05-12 DIAGNOSIS — E119 Type 2 diabetes mellitus without complications: Secondary | ICD-10-CM | POA: Diagnosis not present

## 2019-05-12 DIAGNOSIS — I10 Essential (primary) hypertension: Secondary | ICD-10-CM | POA: Diagnosis not present

## 2019-05-12 DIAGNOSIS — B009 Herpesviral infection, unspecified: Secondary | ICD-10-CM | POA: Diagnosis not present

## 2019-05-12 DIAGNOSIS — I82402 Acute embolism and thrombosis of unspecified deep veins of left lower extremity: Secondary | ICD-10-CM | POA: Diagnosis not present

## 2019-05-12 DIAGNOSIS — J9621 Acute and chronic respiratory failure with hypoxia: Secondary | ICD-10-CM | POA: Diagnosis not present

## 2019-05-12 DIAGNOSIS — Z9181 History of falling: Secondary | ICD-10-CM | POA: Diagnosis not present

## 2019-05-12 DIAGNOSIS — J1282 Pneumonia due to coronavirus disease 2019: Secondary | ICD-10-CM | POA: Diagnosis not present

## 2019-05-14 DIAGNOSIS — U071 COVID-19: Secondary | ICD-10-CM | POA: Diagnosis not present

## 2019-05-14 DIAGNOSIS — M353 Polymyalgia rheumatica: Secondary | ICD-10-CM | POA: Diagnosis not present

## 2019-05-14 DIAGNOSIS — J1282 Pneumonia due to coronavirus disease 2019: Secondary | ICD-10-CM | POA: Diagnosis not present

## 2019-05-14 DIAGNOSIS — I10 Essential (primary) hypertension: Secondary | ICD-10-CM | POA: Diagnosis not present

## 2019-05-14 DIAGNOSIS — Z9181 History of falling: Secondary | ICD-10-CM | POA: Diagnosis not present

## 2019-05-14 DIAGNOSIS — J9621 Acute and chronic respiratory failure with hypoxia: Secondary | ICD-10-CM | POA: Diagnosis not present

## 2019-05-14 DIAGNOSIS — I82402 Acute embolism and thrombosis of unspecified deep veins of left lower extremity: Secondary | ICD-10-CM | POA: Diagnosis not present

## 2019-05-14 DIAGNOSIS — B009 Herpesviral infection, unspecified: Secondary | ICD-10-CM | POA: Diagnosis not present

## 2019-05-14 DIAGNOSIS — E7800 Pure hypercholesterolemia, unspecified: Secondary | ICD-10-CM | POA: Diagnosis not present

## 2019-05-14 DIAGNOSIS — E119 Type 2 diabetes mellitus without complications: Secondary | ICD-10-CM | POA: Diagnosis not present

## 2019-05-17 DIAGNOSIS — Z9181 History of falling: Secondary | ICD-10-CM | POA: Diagnosis not present

## 2019-05-17 DIAGNOSIS — E7800 Pure hypercholesterolemia, unspecified: Secondary | ICD-10-CM | POA: Diagnosis not present

## 2019-05-17 DIAGNOSIS — I82402 Acute embolism and thrombosis of unspecified deep veins of left lower extremity: Secondary | ICD-10-CM | POA: Diagnosis not present

## 2019-05-17 DIAGNOSIS — J1282 Pneumonia due to coronavirus disease 2019: Secondary | ICD-10-CM | POA: Diagnosis not present

## 2019-05-17 DIAGNOSIS — I10 Essential (primary) hypertension: Secondary | ICD-10-CM | POA: Diagnosis not present

## 2019-05-17 DIAGNOSIS — M353 Polymyalgia rheumatica: Secondary | ICD-10-CM | POA: Diagnosis not present

## 2019-05-17 DIAGNOSIS — U071 COVID-19: Secondary | ICD-10-CM | POA: Diagnosis not present

## 2019-05-17 DIAGNOSIS — E119 Type 2 diabetes mellitus without complications: Secondary | ICD-10-CM | POA: Diagnosis not present

## 2019-05-17 DIAGNOSIS — J9621 Acute and chronic respiratory failure with hypoxia: Secondary | ICD-10-CM | POA: Diagnosis not present

## 2019-05-17 DIAGNOSIS — B009 Herpesviral infection, unspecified: Secondary | ICD-10-CM | POA: Diagnosis not present

## 2019-05-19 LAB — FUNGUS CULTURE WITH STAIN

## 2019-05-19 LAB — FUNGUS CULTURE RESULT

## 2019-05-19 LAB — FUNGAL ORGANISM REFLEX

## 2019-05-23 DIAGNOSIS — E7800 Pure hypercholesterolemia, unspecified: Secondary | ICD-10-CM | POA: Diagnosis not present

## 2019-05-23 DIAGNOSIS — J9621 Acute and chronic respiratory failure with hypoxia: Secondary | ICD-10-CM | POA: Diagnosis not present

## 2019-05-23 DIAGNOSIS — B009 Herpesviral infection, unspecified: Secondary | ICD-10-CM | POA: Diagnosis not present

## 2019-05-23 DIAGNOSIS — M353 Polymyalgia rheumatica: Secondary | ICD-10-CM | POA: Diagnosis not present

## 2019-05-23 DIAGNOSIS — I10 Essential (primary) hypertension: Secondary | ICD-10-CM | POA: Diagnosis not present

## 2019-05-23 DIAGNOSIS — U071 COVID-19: Secondary | ICD-10-CM | POA: Diagnosis not present

## 2019-05-23 DIAGNOSIS — I82402 Acute embolism and thrombosis of unspecified deep veins of left lower extremity: Secondary | ICD-10-CM | POA: Diagnosis not present

## 2019-05-23 DIAGNOSIS — Z9181 History of falling: Secondary | ICD-10-CM | POA: Diagnosis not present

## 2019-05-23 DIAGNOSIS — E119 Type 2 diabetes mellitus without complications: Secondary | ICD-10-CM | POA: Diagnosis not present

## 2019-05-23 DIAGNOSIS — J1282 Pneumonia due to coronavirus disease 2019: Secondary | ICD-10-CM | POA: Diagnosis not present

## 2019-05-24 DIAGNOSIS — B009 Herpesviral infection, unspecified: Secondary | ICD-10-CM | POA: Diagnosis not present

## 2019-05-24 DIAGNOSIS — J1282 Pneumonia due to coronavirus disease 2019: Secondary | ICD-10-CM | POA: Diagnosis not present

## 2019-05-24 DIAGNOSIS — Z9181 History of falling: Secondary | ICD-10-CM | POA: Diagnosis not present

## 2019-05-24 DIAGNOSIS — I10 Essential (primary) hypertension: Secondary | ICD-10-CM | POA: Diagnosis not present

## 2019-05-24 DIAGNOSIS — I82402 Acute embolism and thrombosis of unspecified deep veins of left lower extremity: Secondary | ICD-10-CM | POA: Diagnosis not present

## 2019-05-24 DIAGNOSIS — J9621 Acute and chronic respiratory failure with hypoxia: Secondary | ICD-10-CM | POA: Diagnosis not present

## 2019-05-24 DIAGNOSIS — E7800 Pure hypercholesterolemia, unspecified: Secondary | ICD-10-CM | POA: Diagnosis not present

## 2019-05-24 DIAGNOSIS — M353 Polymyalgia rheumatica: Secondary | ICD-10-CM | POA: Diagnosis not present

## 2019-05-24 DIAGNOSIS — E119 Type 2 diabetes mellitus without complications: Secondary | ICD-10-CM | POA: Diagnosis not present

## 2019-05-24 DIAGNOSIS — U071 COVID-19: Secondary | ICD-10-CM | POA: Diagnosis not present

## 2019-05-25 DIAGNOSIS — B009 Herpesviral infection, unspecified: Secondary | ICD-10-CM | POA: Diagnosis not present

## 2019-05-25 DIAGNOSIS — Z9181 History of falling: Secondary | ICD-10-CM | POA: Diagnosis not present

## 2019-05-25 DIAGNOSIS — M353 Polymyalgia rheumatica: Secondary | ICD-10-CM | POA: Diagnosis not present

## 2019-05-25 DIAGNOSIS — E119 Type 2 diabetes mellitus without complications: Secondary | ICD-10-CM | POA: Diagnosis not present

## 2019-05-25 DIAGNOSIS — J1282 Pneumonia due to coronavirus disease 2019: Secondary | ICD-10-CM | POA: Diagnosis not present

## 2019-05-25 DIAGNOSIS — U071 COVID-19: Secondary | ICD-10-CM | POA: Diagnosis not present

## 2019-05-25 DIAGNOSIS — J9621 Acute and chronic respiratory failure with hypoxia: Secondary | ICD-10-CM | POA: Diagnosis not present

## 2019-05-25 DIAGNOSIS — I10 Essential (primary) hypertension: Secondary | ICD-10-CM | POA: Diagnosis not present

## 2019-05-25 DIAGNOSIS — E7800 Pure hypercholesterolemia, unspecified: Secondary | ICD-10-CM | POA: Diagnosis not present

## 2019-05-25 DIAGNOSIS — I82402 Acute embolism and thrombosis of unspecified deep veins of left lower extremity: Secondary | ICD-10-CM | POA: Diagnosis not present

## 2019-05-29 ENCOUNTER — Ambulatory Visit
Admission: RE | Admit: 2019-05-29 | Discharge: 2019-05-29 | Disposition: A | Discharge status: Home / Self Care | Payer: Medicare HMO | Source: Ambulatory Visit | Attending: Internal Medicine | Admitting: Internal Medicine

## 2019-05-29 ENCOUNTER — Other Ambulatory Visit: Payer: Self-pay | Admitting: Internal Medicine

## 2019-05-29 DIAGNOSIS — Z8616 Personal history of COVID-19: Secondary | ICD-10-CM | POA: Diagnosis not present

## 2019-05-29 DIAGNOSIS — E113293 Type 2 diabetes mellitus with mild nonproliferative diabetic retinopathy without macular edema, bilateral: Secondary | ICD-10-CM | POA: Diagnosis not present

## 2019-05-29 DIAGNOSIS — I82412 Acute embolism and thrombosis of left femoral vein: Secondary | ICD-10-CM | POA: Diagnosis not present

## 2019-05-29 DIAGNOSIS — R0602 Shortness of breath: Secondary | ICD-10-CM | POA: Diagnosis not present

## 2019-05-30 DIAGNOSIS — E119 Type 2 diabetes mellitus without complications: Secondary | ICD-10-CM | POA: Diagnosis not present

## 2019-05-30 DIAGNOSIS — B009 Herpesviral infection, unspecified: Secondary | ICD-10-CM | POA: Diagnosis not present

## 2019-05-30 DIAGNOSIS — J1282 Pneumonia due to coronavirus disease 2019: Secondary | ICD-10-CM | POA: Diagnosis not present

## 2019-05-30 DIAGNOSIS — I82402 Acute embolism and thrombosis of unspecified deep veins of left lower extremity: Secondary | ICD-10-CM | POA: Diagnosis not present

## 2019-05-30 DIAGNOSIS — I10 Essential (primary) hypertension: Secondary | ICD-10-CM | POA: Diagnosis not present

## 2019-05-30 DIAGNOSIS — J9621 Acute and chronic respiratory failure with hypoxia: Secondary | ICD-10-CM | POA: Diagnosis not present

## 2019-05-30 DIAGNOSIS — Z9181 History of falling: Secondary | ICD-10-CM | POA: Diagnosis not present

## 2019-05-30 DIAGNOSIS — U071 COVID-19: Secondary | ICD-10-CM | POA: Diagnosis not present

## 2019-05-30 DIAGNOSIS — E7800 Pure hypercholesterolemia, unspecified: Secondary | ICD-10-CM | POA: Diagnosis not present

## 2019-05-30 DIAGNOSIS — M353 Polymyalgia rheumatica: Secondary | ICD-10-CM | POA: Diagnosis not present

## 2019-05-31 DIAGNOSIS — Z9181 History of falling: Secondary | ICD-10-CM | POA: Diagnosis not present

## 2019-05-31 DIAGNOSIS — E7800 Pure hypercholesterolemia, unspecified: Secondary | ICD-10-CM | POA: Diagnosis not present

## 2019-05-31 DIAGNOSIS — I82402 Acute embolism and thrombosis of unspecified deep veins of left lower extremity: Secondary | ICD-10-CM | POA: Diagnosis not present

## 2019-05-31 DIAGNOSIS — J1282 Pneumonia due to coronavirus disease 2019: Secondary | ICD-10-CM | POA: Diagnosis not present

## 2019-05-31 DIAGNOSIS — B009 Herpesviral infection, unspecified: Secondary | ICD-10-CM | POA: Diagnosis not present

## 2019-05-31 DIAGNOSIS — E119 Type 2 diabetes mellitus without complications: Secondary | ICD-10-CM | POA: Diagnosis not present

## 2019-05-31 DIAGNOSIS — U071 COVID-19: Secondary | ICD-10-CM | POA: Diagnosis not present

## 2019-05-31 DIAGNOSIS — J9621 Acute and chronic respiratory failure with hypoxia: Secondary | ICD-10-CM | POA: Diagnosis not present

## 2019-05-31 DIAGNOSIS — I10 Essential (primary) hypertension: Secondary | ICD-10-CM | POA: Diagnosis not present

## 2019-05-31 DIAGNOSIS — M353 Polymyalgia rheumatica: Secondary | ICD-10-CM | POA: Diagnosis not present

## 2019-06-05 DIAGNOSIS — Z9181 History of falling: Secondary | ICD-10-CM | POA: Diagnosis not present

## 2019-06-05 DIAGNOSIS — U071 COVID-19: Secondary | ICD-10-CM | POA: Diagnosis not present

## 2019-06-05 DIAGNOSIS — I82402 Acute embolism and thrombosis of unspecified deep veins of left lower extremity: Secondary | ICD-10-CM | POA: Diagnosis not present

## 2019-06-05 DIAGNOSIS — I10 Essential (primary) hypertension: Secondary | ICD-10-CM | POA: Diagnosis not present

## 2019-06-05 DIAGNOSIS — J1282 Pneumonia due to coronavirus disease 2019: Secondary | ICD-10-CM | POA: Diagnosis not present

## 2019-06-05 DIAGNOSIS — M353 Polymyalgia rheumatica: Secondary | ICD-10-CM | POA: Diagnosis not present

## 2019-06-05 DIAGNOSIS — E7800 Pure hypercholesterolemia, unspecified: Secondary | ICD-10-CM | POA: Diagnosis not present

## 2019-06-05 DIAGNOSIS — J9621 Acute and chronic respiratory failure with hypoxia: Secondary | ICD-10-CM | POA: Diagnosis not present

## 2019-06-05 DIAGNOSIS — B009 Herpesviral infection, unspecified: Secondary | ICD-10-CM | POA: Diagnosis not present

## 2019-06-05 DIAGNOSIS — E119 Type 2 diabetes mellitus without complications: Secondary | ICD-10-CM | POA: Diagnosis not present

## 2019-06-05 LAB — ACID FAST CULTURE WITH REFLEXED SENSITIVITIES (MYCOBACTERIA): Acid Fast Culture: NEGATIVE

## 2019-06-06 DIAGNOSIS — B009 Herpesviral infection, unspecified: Secondary | ICD-10-CM | POA: Diagnosis not present

## 2019-06-06 DIAGNOSIS — I82402 Acute embolism and thrombosis of unspecified deep veins of left lower extremity: Secondary | ICD-10-CM | POA: Diagnosis not present

## 2019-06-06 DIAGNOSIS — M353 Polymyalgia rheumatica: Secondary | ICD-10-CM | POA: Diagnosis not present

## 2019-06-06 DIAGNOSIS — J9621 Acute and chronic respiratory failure with hypoxia: Secondary | ICD-10-CM | POA: Diagnosis not present

## 2019-06-06 DIAGNOSIS — I10 Essential (primary) hypertension: Secondary | ICD-10-CM | POA: Diagnosis not present

## 2019-06-06 DIAGNOSIS — U071 COVID-19: Secondary | ICD-10-CM | POA: Diagnosis not present

## 2019-06-06 DIAGNOSIS — J1282 Pneumonia due to coronavirus disease 2019: Secondary | ICD-10-CM | POA: Diagnosis not present

## 2019-06-06 DIAGNOSIS — E119 Type 2 diabetes mellitus without complications: Secondary | ICD-10-CM | POA: Diagnosis not present

## 2019-06-06 DIAGNOSIS — E7800 Pure hypercholesterolemia, unspecified: Secondary | ICD-10-CM | POA: Diagnosis not present

## 2019-06-06 DIAGNOSIS — Z9181 History of falling: Secondary | ICD-10-CM | POA: Diagnosis not present

## 2019-06-14 DIAGNOSIS — E119 Type 2 diabetes mellitus without complications: Secondary | ICD-10-CM | POA: Diagnosis not present

## 2019-06-14 DIAGNOSIS — E7800 Pure hypercholesterolemia, unspecified: Secondary | ICD-10-CM | POA: Diagnosis not present

## 2019-06-14 DIAGNOSIS — U071 COVID-19: Secondary | ICD-10-CM | POA: Diagnosis not present

## 2019-06-14 DIAGNOSIS — Z9181 History of falling: Secondary | ICD-10-CM | POA: Diagnosis not present

## 2019-06-14 DIAGNOSIS — M353 Polymyalgia rheumatica: Secondary | ICD-10-CM | POA: Diagnosis not present

## 2019-06-14 DIAGNOSIS — B009 Herpesviral infection, unspecified: Secondary | ICD-10-CM | POA: Diagnosis not present

## 2019-06-14 DIAGNOSIS — I82402 Acute embolism and thrombosis of unspecified deep veins of left lower extremity: Secondary | ICD-10-CM | POA: Diagnosis not present

## 2019-06-14 DIAGNOSIS — J9621 Acute and chronic respiratory failure with hypoxia: Secondary | ICD-10-CM | POA: Diagnosis not present

## 2019-06-14 DIAGNOSIS — I10 Essential (primary) hypertension: Secondary | ICD-10-CM | POA: Diagnosis not present

## 2019-06-14 DIAGNOSIS — J1282 Pneumonia due to coronavirus disease 2019: Secondary | ICD-10-CM | POA: Diagnosis not present

## 2019-06-15 ENCOUNTER — Other Ambulatory Visit: Payer: Self-pay

## 2019-06-15 DIAGNOSIS — C434 Malignant melanoma of scalp and neck: Secondary | ICD-10-CM | POA: Diagnosis not present

## 2019-06-15 DIAGNOSIS — X32XXXD Exposure to sunlight, subsequent encounter: Secondary | ICD-10-CM | POA: Diagnosis not present

## 2019-06-15 DIAGNOSIS — D225 Melanocytic nevi of trunk: Secondary | ICD-10-CM | POA: Diagnosis not present

## 2019-06-15 DIAGNOSIS — L570 Actinic keratosis: Secondary | ICD-10-CM | POA: Diagnosis not present

## 2019-06-15 DIAGNOSIS — Z1283 Encounter for screening for malignant neoplasm of skin: Secondary | ICD-10-CM | POA: Diagnosis not present

## 2019-06-15 DIAGNOSIS — Z85820 Personal history of malignant melanoma of skin: Secondary | ICD-10-CM | POA: Diagnosis not present

## 2019-06-15 DIAGNOSIS — Z08 Encounter for follow-up examination after completed treatment for malignant neoplasm: Secondary | ICD-10-CM | POA: Diagnosis not present

## 2019-06-15 DIAGNOSIS — Z85828 Personal history of other malignant neoplasm of skin: Secondary | ICD-10-CM | POA: Diagnosis not present

## 2019-06-18 DIAGNOSIS — E119 Type 2 diabetes mellitus without complications: Secondary | ICD-10-CM | POA: Diagnosis not present

## 2019-06-18 DIAGNOSIS — M353 Polymyalgia rheumatica: Secondary | ICD-10-CM | POA: Diagnosis not present

## 2019-06-18 DIAGNOSIS — I82402 Acute embolism and thrombosis of unspecified deep veins of left lower extremity: Secondary | ICD-10-CM | POA: Diagnosis not present

## 2019-06-18 DIAGNOSIS — E7800 Pure hypercholesterolemia, unspecified: Secondary | ICD-10-CM | POA: Diagnosis not present

## 2019-06-18 DIAGNOSIS — Z9181 History of falling: Secondary | ICD-10-CM | POA: Diagnosis not present

## 2019-06-18 DIAGNOSIS — I10 Essential (primary) hypertension: Secondary | ICD-10-CM | POA: Diagnosis not present

## 2019-06-18 DIAGNOSIS — U071 COVID-19: Secondary | ICD-10-CM | POA: Diagnosis not present

## 2019-06-18 DIAGNOSIS — J9621 Acute and chronic respiratory failure with hypoxia: Secondary | ICD-10-CM | POA: Diagnosis not present

## 2019-06-18 DIAGNOSIS — J1282 Pneumonia due to coronavirus disease 2019: Secondary | ICD-10-CM | POA: Diagnosis not present

## 2019-06-18 DIAGNOSIS — B009 Herpesviral infection, unspecified: Secondary | ICD-10-CM | POA: Diagnosis not present

## 2019-06-20 DIAGNOSIS — B009 Herpesviral infection, unspecified: Secondary | ICD-10-CM | POA: Diagnosis not present

## 2019-06-20 DIAGNOSIS — U071 COVID-19: Secondary | ICD-10-CM | POA: Diagnosis not present

## 2019-06-20 DIAGNOSIS — M353 Polymyalgia rheumatica: Secondary | ICD-10-CM | POA: Diagnosis not present

## 2019-06-20 DIAGNOSIS — J9621 Acute and chronic respiratory failure with hypoxia: Secondary | ICD-10-CM | POA: Diagnosis not present

## 2019-06-20 DIAGNOSIS — J1282 Pneumonia due to coronavirus disease 2019: Secondary | ICD-10-CM | POA: Diagnosis not present

## 2019-06-20 DIAGNOSIS — I82402 Acute embolism and thrombosis of unspecified deep veins of left lower extremity: Secondary | ICD-10-CM | POA: Diagnosis not present

## 2019-06-20 DIAGNOSIS — E7800 Pure hypercholesterolemia, unspecified: Secondary | ICD-10-CM | POA: Diagnosis not present

## 2019-06-20 DIAGNOSIS — I10 Essential (primary) hypertension: Secondary | ICD-10-CM | POA: Diagnosis not present

## 2019-06-20 DIAGNOSIS — Z9181 History of falling: Secondary | ICD-10-CM | POA: Diagnosis not present

## 2019-06-20 DIAGNOSIS — E119 Type 2 diabetes mellitus without complications: Secondary | ICD-10-CM | POA: Diagnosis not present

## 2019-06-29 ENCOUNTER — Ambulatory Visit (INDEPENDENT_AMBULATORY_CARE_PROVIDER_SITE_OTHER): Payer: Medicare HMO | Admitting: Critical Care Medicine

## 2019-06-29 ENCOUNTER — Encounter: Payer: Self-pay | Admitting: Critical Care Medicine

## 2019-06-29 ENCOUNTER — Other Ambulatory Visit: Payer: Self-pay

## 2019-06-29 VITALS — BP 134/70 | HR 78 | Temp 98.9°F | Ht 70.0 in | Wt 161.6 lb

## 2019-06-29 DIAGNOSIS — I824Z2 Acute embolism and thrombosis of unspecified deep veins of left distal lower extremity: Secondary | ICD-10-CM

## 2019-06-29 DIAGNOSIS — J9611 Chronic respiratory failure with hypoxia: Secondary | ICD-10-CM

## 2019-06-29 DIAGNOSIS — Z8616 Personal history of COVID-19: Secondary | ICD-10-CM

## 2019-06-29 NOTE — Patient Instructions (Addendum)
Thank you for visiting Dr. Carlis Abbott at [facility]. We recommend the following: Orders Placed This Encounter  Procedures  . CT Chest High Resolution  . Pulmonary Function Test   Orders Placed This Encounter  Procedures  . CT Chest High Resolution    First available    Standing Status:   Future    Standing Expiration Date:   08/28/2020    Order Specific Question:   ** REASON FOR EXAM (FREE TEXT)    Answer:   post-covid    Order Specific Question:   Preferred imaging location?    Answer:   [facility]    Order Specific Question:   Radiology Contrast Protocol - do NOT remove file path    Answer:   [email or web address]  . Pulmonary Function Test    Standing Status:   Future    Standing Expiration Date:   06/28/2020    Order Specific Question:   Where should this test be performed?    Answer:   [facility]    Order Specific Question:   Full PFT: includes the following: basic spirometry, spirometry pre & post bronchodilator, diffusion capacity (DLCO), lung volumes    Answer:   Full PFT    No orders of the defined types were placed in this encounter.   Return in about 4 weeks (around 07/27/2019).    Please do your part to reduce the spread of COVID-19.

## 2019-06-29 NOTE — Progress Notes (Signed)
Synopsis: Referred in April 2021 for post-covid by Lavone Orn, MD.  Subjective:   PATIENT ID: Dustin Diaz Vallie GENDER: male DOB: [birth year], MRN: [id number]  Chief Complaint  Patient presents with  . Consult    Patient still has shortness of breath with exertion. Wears oxygen at night. Was wearing it all the time but recently stopped during the day. Dr. Laurann Montana told he he could as long as he was above 90%. Patient is having some congestion due to allergies and has occosianl cough mostly at night.     Mr. Dustin Diaz is a 74 year old gentleman who presents for chronic respiratory failure and shortness of breath post Covid pneumonia.  He is accompanied today by his wife.  He was hospitalized from 1/14-2/11, where he was treated with remdesivir, steroids, and 2 doses of Tocilizumab.  He was discharged on his home prednisone regimen for polymyalgia rheumatica and 4 L of supplemental oxygen.  He lost about 20 pounds while he was hospitalized, but has gained most of it back.  He is no longer requiring oxygen during the day with saturations remaining above 90, but he continues to use 2 L of supplemental oxygen at night.  His hospitalization was complicated by a DVT, for which he remains on Eliquis.  He was prescribed a Combivent inhaler at discharge which she is used infrequently without significant benefit.  His shortness of breath has slowly improved, but he is not back to his baseline.  He has an occasional dry cough that wakes him up at night, but it does not persist.  No wheezing.  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  He completed a course of acyclovir and has had improvement in the pain associated with these lesions, especially the one on his lower lip.  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  He will be going to [facility] for evaluation of this and may have to have lymph node biopsies performed.  Previous  tobacco abuse-quit around 1980 after 20 years x <0.5ppd.  No previous lung disease prior to hospitalization.  05/31/2019 PCP note reviewed, Dr. Laurann Montana. Hospital discharge summary reviewed.     Past Medical History:  Diagnosis Date  . AAA (abdominal aortic aneurysm) ([place])   . CKD (chronic kidney disease)   . Colon polyp   . Diabetes mellitus without complication ([place])   . Diabetic retinopathy ([place])   . Glaucoma   . High cholesterol   . Hyperlipemia   . Hypertension   . Melanoma ([place])    scalp  . Polymyalgia rheumatica ([facility])      Family History  Problem Relation Age of Onset  . Heart failure Father      Past Surgical History:  Procedure Laterality Date  . APPENDECTOMY  1956  . BACK SURGERY  1988  . SKIN SURGERY  2012   melanoma removal  . TONSILLECTOMY      Social History   Socioeconomic History  . Marital status: Married    Spouse name: Velva Harman  . Number of children: 3  . Years of education: college  . Highest education level: Not on file  Occupational History  . Occupation: Retired  Tobacco Use  . Smoking status: Former Smoker    Packs/day: 0.50    Types: Cigarettes    Quit date: 06/29/1979    Years since quitting: 40.0  . Smokeless tobacco: Former Systems developer    Types: Snuff  Quit date: 03/31/2019  Substance and Sexual Activity  . Alcohol use: Yes    Comment: occ: 1 beer monthly  . Drug use: No  . Sexual activity: Not on file  Other Topics Concern  . Not on file  Social History Narrative   Pt lives at home with his spouse.    Caffeine Use-3-4 cups daily   Social Determinants of Health   Financial Resource Strain:   . Difficulty of Paying Living Expenses:   Food Insecurity:   . Worried About Charity fundraiser in the Last Year:   . Arboriculturist in the Last Year:   Transportation Needs:   . Film/video editor (Medical):   Marland Kitchen Lack of Transportation (Non-Medical):   Physical Activity:   . Days of Exercise per Week:   . Minutes of Exercise per  Session:   Stress:   . Feeling of Stress :   Social Connections:   . Frequency of Communication with Friends and Family:   . Frequency of Social Gatherings with Friends and Family:   . Attends Religious Services:   . Active Member of Clubs or Organizations:   . Attends Archivist Meetings:   Marland Kitchen Marital Status:   Intimate Partner Violence:   . Fear of Current or Ex-Partner:   . Emotionally Abused:   Marland Kitchen Physically Abused:   . Sexually Abused:      Allergies  Allergen Reactions  . Codeine      Immunization History  Administered Date(s) Administered  . Fluad Quad(high Dose 65+) 12/12/2018  . PFIZER SARS-COV-2 Vaccination 05/21/2019, 06/11/2019    Outpatient Medications Prior to Visit  Medication Sig Dispense Refill  . acyclovir ointment (ZOVIRAX) 5 % Apply topically in the morning, at noon, in the evening, and at bedtime. 5 g 0  . apixaban (ELIQUIS) 5 MG TABS tablet Take 1 tablet (5 mg total) by mouth 2 (two) times daily. 60 tablet 4  . atorvastatin (LIPITOR) 10 MG tablet Take 10 mg by mouth daily.    . feeding supplement, GLUCERNA SHAKE, (GLUCERNA SHAKE) LIQD Take 237 mLs by mouth 3 (three) times daily between meals.    . fluticasone (FLONASE) 50 MCG/ACT nasal spray Place 2 sprays into the nose daily as needed for allergies.     Marland Kitchen glimepiride (AMARYL) 2 MG tablet Take 2 mg by mouth every morning. 2 tablets in am    . JARDIANCE 25 MG TABS tablet Take 25 mg by mouth every morning.    . latanoprost (XALATAN) 0.005 % ophthalmic solution Place 1 drop into both eyes at bedtime.    . metFORMIN (GLUCOPHAGE-XR) 500 MG 24 hr tablet Take 2,000 mg by mouth every morning.    . predniSONE (DELTASONE) 1 MG tablet Take 3 mg by mouth daily.     . Ipratropium-Albuterol (COMBIVENT) 20-100 MCG/ACT AERS respimat Inhale 1 puff into the lungs every 6 (six) hours as needed for wheezing. (Patient not taking: Reported on 06/29/2019) 4 g 1  . aspirin 81 MG tablet Take 81 mg by mouth daily.    .  feeding supplement, ENSURE ENLIVE, (ENSURE ENLIVE) LIQD Take 237 mLs by mouth 2 (two) times daily between meals. 237 mL 12  . guaiFENesin-dextromethorphan (ROBITUSSIN DM) 100-10 MG/5ML syrup Take 10 mLs by mouth every 4 (four) hours as needed for cough. 118 mL 0  . pioglitazone (ACTOS) 45 MG tablet Take 45 mg by mouth daily.     No facility-administered medications prior to visit.  Review of Systems  Constitutional: Negative for chills and fever.       Gaining back weight  Respiratory: Positive for cough and shortness of breath. Negative for sputum production and wheezing.   Cardiovascular: Negative for chest pain and leg swelling.  Gastrointestinal: Negative for blood in stool, heartburn, melena, nausea and vomiting.  Neurological: Negative for focal weakness.       Lip tingling from ulcer  Endo/Heme/Allergies: Does not bruise/bleed easily.     Objective:   Vitals:   06/29/19 1444  BP: 134/70  Pulse: 78  Temp: 98.9 F (37.2 C)  TempSrc: Temporal  SpO2: 91%  Weight: 161 lb 9.6 oz (73.3 kg)  Height: 5\' 10"  (1.778 m)   91% on   RA BMI Readings from Last 3 Encounters:  06/29/19 23.19 kg/m  05/03/19 21.19 kg/m  07/24/12 29.41 kg/m   Wt Readings from Last 3 Encounters:  06/29/19 161 lb 9.6 oz (73.3 kg)  05/03/19 147 lb 11.3 oz (67 kg)  07/24/12 205 lb (93 kg)    Physical Exam Vitals reviewed.  Constitutional:      Appearance: Normal appearance. He is not ill-appearing.     Comments: Fatigued appearing  HENT:     Head: Normocephalic and atraumatic.  Eyes:     General: No scleral icterus. Cardiovascular:     Rate and Rhythm: Normal rate and regular rhythm.     Heart sounds: No murmur.  Pulmonary:     Comments: Mild tachypnea, minimal conversational dyspnea. L > R basilar rhales. Abdominal:     General: There is no distension.     Palpations: Abdomen is soft.  Musculoskeletal:        General: No swelling or deformity.     Cervical back: Neck supple.   Skin:    General: Skin is warm and dry.     Comments: Scabbed lesions on right neck, healing lower lip lesion.  Neurological:     General: No focal deficit present.     Mental Status: He is alert.     Coordination: Coordination normal.  Psychiatric:        Mood and Affect: Mood normal.        Behavior: Behavior normal.      CBC    Component Value Date/Time   WBC 8.5 04/27/2019 0421   RBC 4.26 04/27/2019 0421   HGB 12.7 (L) 04/27/2019 0421   HCT 39.7 04/27/2019 0421   PLT 213 04/27/2019 0421   MCV 93.2 04/27/2019 0421   MCH 29.8 04/27/2019 0421   MCHC 32.0 04/27/2019 0421   RDW 14.6 04/27/2019 0421   LYMPHSABS 1.3 04/25/2019 0447   MONOABS 1.1 (H) 04/25/2019 0447   EOSABS 0.6 (H) 04/25/2019 0447   BASOSABS 0.1 04/25/2019 0447    CHEMISTRY No results for input(s): NA, K, CL, CO2, GLUCOSE, BUN, CREATININE, CALCIUM, MG, PHOS in the last 168 hours. CrCl cannot be calculated (Patient's most recent lab result is older than the maximum 21 days allowed.).   Chest Imaging- films reviewed: CXR, 2 view 05/29/2019- fibrosis bilaterally, R>L  CT chest 04/20/2019- mild right paratracheal and hilar adenopathy.  Right greater than left opacities with prominent intern intralobular septal thickening, mild groundglass.  More dense consolidation in the bases and throughout the right upper lobe.   Pulmonary Functions Testing Results: No flowsheet data found.       Assessment & Plan:     ICD-10-CM   1. Chronic respiratory failure with hypoxia (HCC)  J96.11 Pulmonary Function  Test    CT Chest High Resolution  2. History of COVID-19  Z86.16 Pulmonary Function Test    CT Chest High Resolution  3. Acute deep vein thrombosis (DVT) of distal vein of left lower extremity (HCC)  I82.4Z2     Chronic hypoxic respiratory failure and DOE post COVID-19 viral pneumonia -PFTs -HRCT chest. Reviewed his previous CT scans with him today.  We will be able to follow-up previous adenopathy that was  likely reactive on this scan. -Discussed the possibility of chronic fibrosis resulting from Covid pneumonia.  At this point we do not know if this will be progressive or will continue to improve.  We will monitor his symptoms clinically. -Continue to work on increasing his exercise tolerance.  Provoked DVT -6 months of anticoagulation  RTC in 1 to 2 months.     Current Outpatient Medications:  .  acyclovir ointment (ZOVIRAX) 5 %, Apply topically in the morning, at noon, in the evening, and at bedtime., Disp: 5 g, Rfl: 0 .  apixaban (ELIQUIS) 5 MG TABS tablet, Take 1 tablet (5 mg total) by mouth 2 (two) times daily., Disp: 60 tablet, Rfl: 4 .  atorvastatin (LIPITOR) 10 MG tablet, Take 10 mg by mouth daily., Disp: , Rfl:  .  feeding supplement, GLUCERNA SHAKE, (GLUCERNA SHAKE) LIQD, Take 237 mLs by mouth 3 (three) times daily between meals., Disp: , Rfl:  .  fluticasone (FLONASE) 50 MCG/ACT nasal spray, Place 2 sprays into the nose daily as needed for allergies. , Disp: , Rfl:  .  glimepiride (AMARYL) 2 MG tablet, Take 2 mg by mouth every morning. 2 tablets in am, Disp: , Rfl:  .  JARDIANCE 25 MG TABS tablet, Take 25 mg by mouth every morning., Disp: , Rfl:  .  latanoprost (XALATAN) 0.005 % ophthalmic solution, Place 1 drop into both eyes at bedtime., Disp: , Rfl:  .  metFORMIN (GLUCOPHAGE-XR) 500 MG 24 hr tablet, Take 2,000 mg by mouth every morning., Disp: , Rfl:  .  predniSONE (DELTASONE) 1 MG tablet, Take 3 mg by mouth daily. , Disp: , Rfl:  .  Ipratropium-Albuterol (COMBIVENT) 20-100 MCG/ACT AERS respimat, Inhale 1 puff into the lungs every 6 (six) hours as needed for wheezing. (Patient not taking: Reported on 06/29/2019), Disp: 4 g, Rfl: Redford Alauna Hayden, DO [facility] 06/29/2019 3:13 PM

## 2019-06-30 DIAGNOSIS — U071 COVID-19: Secondary | ICD-10-CM | POA: Diagnosis not present

## 2019-07-02 DIAGNOSIS — E1122 Type 2 diabetes mellitus with diabetic chronic kidney disease: Secondary | ICD-10-CM | POA: Diagnosis not present

## 2019-07-02 DIAGNOSIS — Z8616 Personal history of COVID-19: Secondary | ICD-10-CM | POA: Diagnosis not present

## 2019-07-02 DIAGNOSIS — E113293 Type 2 diabetes mellitus with mild nonproliferative diabetic retinopathy without macular edema, bilateral: Secondary | ICD-10-CM | POA: Diagnosis not present

## 2019-07-02 DIAGNOSIS — I10 Essential (primary) hypertension: Secondary | ICD-10-CM | POA: Diagnosis not present

## 2019-07-02 DIAGNOSIS — Z86718 Personal history of other venous thrombosis and embolism: Secondary | ICD-10-CM | POA: Diagnosis not present

## 2019-07-02 DIAGNOSIS — J9611 Chronic respiratory failure with hypoxia: Secondary | ICD-10-CM | POA: Diagnosis not present

## 2019-07-02 DIAGNOSIS — N182 Chronic kidney disease, stage 2 (mild): Secondary | ICD-10-CM | POA: Diagnosis not present

## 2019-07-02 DIAGNOSIS — Z7984 Long term (current) use of oral hypoglycemic drugs: Secondary | ICD-10-CM | POA: Diagnosis not present

## 2019-07-04 ENCOUNTER — Ambulatory Visit (INDEPENDENT_AMBULATORY_CARE_PROVIDER_SITE_OTHER)
Admission: RE | Admit: 2019-07-04 | Discharge: 2019-07-04 | Disposition: A | Discharge status: Home / Self Care | Payer: Medicare HMO | Source: Ambulatory Visit | Attending: Critical Care Medicine | Admitting: Critical Care Medicine

## 2019-07-04 ENCOUNTER — Other Ambulatory Visit: Payer: Self-pay

## 2019-07-04 DIAGNOSIS — J9611 Chronic respiratory failure with hypoxia: Secondary | ICD-10-CM

## 2019-07-04 DIAGNOSIS — Z8616 Personal history of COVID-19: Secondary | ICD-10-CM

## 2019-07-04 DIAGNOSIS — J8410 Pulmonary fibrosis, unspecified: Secondary | ICD-10-CM | POA: Diagnosis not present

## 2019-07-05 NOTE — Progress Notes (Signed)
Please let Dustin Diaz know that her has fibrosis on his follow up CT scan. We do not know if this will continue to improve, remain, or potentially worsen over time. We will monitor his symptoms and follow up scans. Thanks!

## 2019-07-06 DIAGNOSIS — D485 Neoplasm of uncertain behavior of skin: Secondary | ICD-10-CM | POA: Diagnosis not present

## 2019-07-12 ENCOUNTER — Telehealth: Payer: Self-pay | Admitting: Critical Care Medicine

## 2019-07-12 DIAGNOSIS — C439 Malignant melanoma of skin, unspecified: Secondary | ICD-10-CM | POA: Diagnosis not present

## 2019-07-12 NOTE — Telephone Encounter (Signed)
I spoke with Dustin Diaz and advised her that was the earliest we could do and apologized that we couldn't find a earlier time. She understood and nothing further is needed.

## 2019-07-20 ENCOUNTER — Other Ambulatory Visit (HOSPITAL_COMMUNITY)
Admission: RE | Admit: 2019-07-20 | Discharge: 2019-07-20 | Disposition: A | Discharge status: Home / Self Care | Payer: Medicare HMO | Source: Ambulatory Visit | Attending: Critical Care Medicine | Admitting: Critical Care Medicine

## 2019-07-20 DIAGNOSIS — Z20822 Contact with and (suspected) exposure to covid-19: Secondary | ICD-10-CM | POA: Insufficient documentation

## 2019-07-20 DIAGNOSIS — Z01812 Encounter for preprocedural laboratory examination: Secondary | ICD-10-CM | POA: Diagnosis not present

## 2019-07-21 LAB — SARS CORONAVIRUS 2 (TAT 6-24 HRS): SARS Coronavirus 2: NEGATIVE

## 2019-07-23 ENCOUNTER — Ambulatory Visit (INDEPENDENT_AMBULATORY_CARE_PROVIDER_SITE_OTHER): Payer: Medicare HMO | Admitting: Critical Care Medicine

## 2019-07-23 ENCOUNTER — Other Ambulatory Visit: Payer: Self-pay

## 2019-07-23 ENCOUNTER — Ambulatory Visit: Payer: Medicare HMO | Admitting: Adult Health

## 2019-07-23 ENCOUNTER — Encounter: Payer: Self-pay | Admitting: Adult Health

## 2019-07-23 VITALS — BP 130/60 | HR 72 | Temp 97.9°F | Ht 70.0 in | Wt 164.2 lb

## 2019-07-23 DIAGNOSIS — J8410 Pulmonary fibrosis, unspecified: Secondary | ICD-10-CM | POA: Diagnosis not present

## 2019-07-23 DIAGNOSIS — I82409 Acute embolism and thrombosis of unspecified deep veins of unspecified lower extremity: Secondary | ICD-10-CM | POA: Insufficient documentation

## 2019-07-23 DIAGNOSIS — J9611 Chronic respiratory failure with hypoxia: Secondary | ICD-10-CM | POA: Insufficient documentation

## 2019-07-23 DIAGNOSIS — Z8616 Personal history of COVID-19: Secondary | ICD-10-CM

## 2019-07-23 DIAGNOSIS — Z01818 Encounter for other preprocedural examination: Secondary | ICD-10-CM | POA: Insufficient documentation

## 2019-07-23 DIAGNOSIS — I824Y9 Acute embolism and thrombosis of unspecified deep veins of unspecified proximal lower extremity: Secondary | ICD-10-CM | POA: Diagnosis not present

## 2019-07-23 LAB — PULMONARY FUNCTION TEST
DL/VA % pred: 108 %
DL/VA: 4.37 ml/min/mmHg/L
DLCO cor % pred: 53 %
DLCO cor: 13.58 ml/min/mmHg
DLCO unc % pred: 53 %
DLCO unc: 13.58 ml/min/mmHg
FEF 25-75 Post: 3.53 L/sec
FEF 25-75 Pre: 3.12 L/sec
FEF2575-%Change-Post: 13 %
FEF2575-%Pred-Post: 148 %
FEF2575-%Pred-Pre: 131 %
FEV1-%Change-Post: 0 %
FEV1-%Pred-Post: 63 %
FEV1-%Pred-Pre: 64 %
FEV1-Post: 2.03 L
FEV1-Pre: 2.04 L
FEV1FVC-%Change-Post: 1 %
FEV1FVC-%Pred-Pre: 122 %
FEV6-%Change-Post: 0 %
FEV6-%Pred-Post: 53 %
FEV6-%Pred-Pre: 54 %
FEV6-Post: 2.21 L
FEV6-Pre: 2.22 L
FEV6FVC-%Pred-Post: 106 %
FEV6FVC-%Pred-Pre: 106 %
FVC-%Change-Post: -2 %
FVC-%Pred-Post: 50 %
FVC-%Pred-Pre: 52 %
FVC-Post: 2.22 L
FVC-Pre: 2.27 L
Post FEV1/FVC ratio: 91 %
Post FEV6/FVC ratio: 100 %
Pre FEV1/FVC ratio: 90 %
Pre FEV6/FVC Ratio: 100 %
RV % pred: 61 %
RV: 1.54 L
TLC % pred: 54 %
TLC: 3.84 L

## 2019-07-23 NOTE — Patient Instructions (Addendum)
Advance activity as tolerated.  Good luck with upcoming surgery .  Set up for overnight oximetry test  Follow up with Dr. Carlis Abbott in 6 weeks and As needed

## 2019-07-23 NOTE — Assessment & Plan Note (Signed)
DVT during critical illness with COVID-19.  Patient is to continue on Eliquis for a provoked DVT-6 months of therapy.

## 2019-07-23 NOTE — Assessment & Plan Note (Signed)
Patient's oxygen demands have decreased.  Now maintaining O2 saturations with activity without desaturations.  We will set up overnight oximetry test to see if he still needs nocturnal oxygen.

## 2019-07-23 NOTE — Progress Notes (Signed)
PFT done today. 

## 2019-07-23 NOTE — Assessment & Plan Note (Signed)
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  He has had gradual improvement in activity tolerance and decrease oxygen demands.  Pulmonary function testing shows moderate restrictive pattern.  His high-resolution CT chest does show improvement but residual postinflammatory changes.  We will need to continue to follow very closely.  Would consider a spirometry with DLCO in 6 months if indicated.  For now would advance activity as tolerated.  Did discuss pulmonary rehab however he wants to hold off on this at this time.

## 2019-07-23 NOTE — Progress Notes (Signed)
@Patient  ID: Saddie Benders, male    DOB: 1945/08/19, 74 y.o.   MRN: [id number]  Chief Complaint  Patient presents with  . Follow-up    Fibrosis     Referring provider: Lavone Orn, MD  HPI: 74 year old male former smoker seen for pulmonary consult June 29, 2019 for chronic respiratory failure and post Covid pneumonia, hospitalization complicated by DVT on Eliquis  TEST/EVENTS :  Critical illness January 2021 with Covid pneumonia-treated with remdesivir, steroid and 2 doses of Tocilizumab.   07/23/2019 Follow up : Post Covid fibrosis, chronic respiratory failure, DVT, surgical clearance Patient presents for a follow-up visit.  Patient was recently seen last month for follow-up from hospitalization January 2021.  Patient was admitted with [id number] -complicated by [id number] pneumonia.  DVT now on Eliquis.  Initial CT chest showed no evidence of pulmonary embolism but bilateral airspace disease throughout both lungs.  Patient was initially discharged on 4 L of oxygen.  Has been able to wean himself off of oxygen except at bedtime.  He has slowly increased his activity.  He is starting to do yard work.  He has been checking his oxygen levels with activity and O2 saturations have remained greater than 90% on room air.  He is using oxygen at bedtime.  He says his energy level is slowly returning but he still gets short of breath and has to rest frequently.  He did lose 20 pounds during his critical illness.  He is trying to use protein shakes to help.  Patient had pulmonary function testing done this morning.  At this showed moderate restriction with FEV1 64%, ratio 90, FVC 52%, no significant bronchodilator response, DLCO 53%. High-resolution CT chest was done on July 05, 2019 that showed significant decrease in groundglass attenuation and airspace consolidation.  However residual peripheral groundglass attenuation, thickening of peribronchial vascular interstitium and regional architectural  distortion.  Consistent with  postinfectious fibrosis from COVID-19. We discussed his test findings with him and his wife.  Patient has been diagnosed with a melanoma on his scalp.  He is requesting a surgical clearance today.  He is planning on having this done at [facility] at the hospital.  He will require general anesthesia.  Slowly increasing activity . Starting to work in the yard. Checking Oxygen with activity on room air ->90%.  Appetite is improving.    Allergies  Allergen Reactions  . Codeine     Immunization History  Administered Date(s) Administered  . Fluad Quad(high Dose 65+) 12/12/2018  . PFIZER SARS-COV-2 Vaccination 05/21/2019, 06/11/2019    Past Medical History:  Diagnosis Date  . AAA (abdominal aortic aneurysm) ([place])   . CKD (chronic kidney disease)   . Colon polyp   . Diabetes mellitus without complication ([place])   . Diabetic retinopathy ([place])   . Glaucoma   . High cholesterol   . Hyperlipemia   . Hypertension   . Melanoma ([place])    scalp  . Polymyalgia rheumatica (HCC)     Tobacco History: Social History   Tobacco Use  Smoking Status Former Smoker  . Packs/day: 0.50  . Types: Cigarettes  . Quit date: 06/29/1979  . Years since quitting: 40.0  Smokeless Tobacco Former Systems developer  . Types: Snuff  . Quit date: 03/31/2019   Counseling given: Not Answered   Outpatient Medications Prior to Visit  Medication Sig Dispense Refill  . acyclovir ointment (ZOVIRAX) 5 % Apply topically in the morning, at noon, in the evening, and at bedtime. 5 g  0  . apixaban (ELIQUIS) 5 MG TABS tablet Take 1 tablet (5 mg total) by mouth 2 (two) times daily. 60 tablet 4  . atorvastatin (LIPITOR) 10 MG tablet Take 10 mg by mouth daily.    . feeding supplement, GLUCERNA SHAKE, (GLUCERNA SHAKE) LIQD Take 237 mLs by mouth 3 (three) times daily between meals.    . fluticasone (FLONASE) 50 MCG/ACT nasal spray Place 2 sprays into the nose daily as needed for allergies.     Marland Kitchen glimepiride  (AMARYL) 2 MG tablet Take 2 mg by mouth every morning. 2 tablets in am    . Ipratropium-Albuterol (COMBIVENT) 20-100 MCG/ACT AERS respimat Inhale 1 puff into the lungs every 6 (six) hours as needed for wheezing. 4 g 1  . JARDIANCE 25 MG TABS tablet Take 25 mg by mouth every morning.    . latanoprost (XALATAN) 0.005 % ophthalmic solution Place 1 drop into both eyes at bedtime.    . metFORMIN (GLUCOPHAGE-XR) 500 MG 24 hr tablet Take 2,000 mg by mouth every morning.    . predniSONE (DELTASONE) 1 MG tablet Take 3 mg by mouth daily.      No facility-administered medications prior to visit.     Review of Systems:   Constitutional:   No  weight loss, night sweats,  Fevers, chills, + fatigue, or  lassitude.  HEENT:   No headaches,  Difficulty swallowing,  Tooth/dental problems, or  Sore throat,                No sneezing, itching, ear ache, nasal congestion, post nasal drip,   CV:  No chest pain,  Orthopnea, PND, swelling in lower extremities, anasarca, dizziness, palpitations, syncope.   GI  No heartburn, indigestion, abdominal pain, nausea, vomiting, diarrhea, change in bowel habits, loss of appetite, bloody stools.   Resp:  No excess mucus, no productive cough,  No non-productive cough,  No coughing up of blood.  No change in color of mucus.  No wheezing.  No chest wall deformity  Skin: no rash or lesions.  GU: no dysuria, change in color of urine, no urgency or frequency.  No flank pain, no hematuria   MS:  No joint pain or swelling.  No decreased range of motion.  No back pain.    Physical Exam  BP 130/60 (BP Location: Left Arm, Cuff Size: Normal)   Pulse 72   Temp 97.9 F (36.6 C) (Temporal)   Ht 5\' 10"  (1.778 m)   Wt 164 lb 3.2 oz (74.5 kg)   SpO2 97%   BMI 23.56 kg/m   GEN: A/Ox3; pleasant , NAD, elderly   HEENT:  [place]/AT,  NOSE-clear, THROAT-clear, no lesions, no postnasal drip or exudate noted.   NECK:  Supple w/ fair ROM; no JVD; normal carotid impulses w/o bruits;  no thyromegaly or nodules palpated; no lymphadenopathy.    RESP faint bibasilar crackles  no accessory muscle use, no dullness to percussion  CARD:  RRR, no m/r/g, no peripheral edema, pulses intact, no cyanosis or clubbing.  GI:   Soft & nt; nml bowel sounds; no organomegaly or masses detected.   Musco: Warm bil, no deformities or joint swelling noted.   Neuro: alert, no focal deficits noted.    Skin: Warm, no lesions or rashes    Lab Results:  CBC  ProBNP No results found for: PROBNP  Imaging: CT Chest High Resolution  Result Date: 07/05/2019 CLINICAL DATA:  74 year old male with history of dyspnea on exertion following COVID-19  infection in January 2021. EXAM: CT CHEST WITHOUT CONTRAST TECHNIQUE: Multidetector CT imaging of the chest was performed following the standard protocol without intravenous contrast. High resolution imaging of the lungs, as well as inspiratory and expiratory imaging, was performed. COMPARISON:  Chest CT 04/20/2019. FINDINGS: Cardiovascular: Heart size is normal. Small amount of pericardial fluid and/or thickening, unlikely to be of any hemodynamic significance at this time. No associated pericardial calcification. There is aortic atherosclerosis, as well as atherosclerosis of the great vessels of the mediastinum and the coronary arteries, including calcified atherosclerotic plaque in the left main, left anterior descending, left circumflex and right coronary arteries. Ectasia of ascending thoracic aorta which measures up to 4.2 cm in diameter. Mediastinum/Nodes: No pathologically enlarged mediastinal or hilar lymph nodes. Please note that accurate exclusion of hilar adenopathy is limited on noncontrast CT scans. Esophagus is unremarkable in appearance. No axillary lymphadenopathy. Lungs/Pleura: When compared to the prior examination from April 20, 2019, the widespread areas of ground-glass attenuation and airspace consolidation have significantly regressed.  Today's study does demonstrate a small amount of residual peripheral predominant ground-glass attenuation, but more importantly demonstrates widespread areas of septal thickening, thickening of the peribronchovascular interstitium, regional architectural distortion (most evident in the periphery of the lungs with some areas of subpleural sparing), and scattered areas of cylindrical bronchiectasis. No frank honeycombing. These findings do appear to have a mild craniocaudal gradient and are asymmetric involving the right lung to a greater extent than the left. No confluent consolidative airspace disease. No pleural effusions. No definite suspicious appearing pulmonary nodules or masses are noted. Inspiratory and expiratory imaging is unremarkable. Upper Abdomen: Aortic atherosclerosis. Musculoskeletal: There are no aggressive appearing lytic or blastic lesions noted in the visualized portions of the skeleton. IMPRESSION: 1. There are widespread fibrotic changes in the lungs bilaterally (right greater than left), as above. Overall, the pattern on today's examination is technically characterized as probable usual interstitial pneumonia (UIP) per current ATS guidelines. However, given the patient's prior history of COVID-19 infection, and several areas which have morphologic changes most compatible with evolving cryptogenic organizing pneumonia (COP), these imaging findings are presumably most likely reflective of post infectious fibrosis. Outpatient referral to Pulmonology for further evaluation is recommended. 2. Small amount of pericardial fluid and/or thickening, unlikely to be of any hemodynamic significance at this time. 3. Aortic atherosclerosis, in addition to left main and 3 vessel coronary artery disease. Assessment for potential risk factor modification, dietary therapy or pharmacologic therapy may be warranted, if clinically indicated. 4. In addition, there is ectasia of the ascending thoracic aorta (4.2 cm  in diameter). Recommend annual imaging followup by CTA or MRA. This recommendation follows 2010 ACCF/AHA/AATS/ACR/ASA/SCA/SCAI/SIR/STS/SVM Guidelines for the Diagnosis and Management of Patients with Thoracic Aortic Disease. Circulation. 2010; [id number]. Aortic aneurysm NOS (ICD10-I71.9). Aortic Atherosclerosis (ICD10-I70.0). Electronically Signed   By: Vinnie Langton M.D.   On: 07/05/2019 06:41      PFT Results Latest Ref Rng & Units 07/23/2019  FVC-Pre L 2.27  FVC-Predicted Pre % 52  FVC-Post L 2.22  FVC-Predicted Post % 50  Pre FEV1/FVC % % 90  Post FEV1/FCV % % 91  FEV1-Pre L 2.04  FEV1-Predicted Pre % 64  FEV1-Post L 2.03  DLCO UNC% % 53  DLCO COR %Predicted % 108  TLC L 3.84  TLC % Predicted % 54  RV % Predicted % 61    No results found for: NITRICOXIDE      Assessment & Plan:   Postinflammatory pulmonary fibrosis (HCC)  Postinflammatory pulmonary fibrosis-patient had COVID-19 with COVID-19 pneumonia January 2021.  He has had gradual improvement in activity tolerance and decrease oxygen demands.  Pulmonary function testing shows moderate restrictive pattern.  His high-resolution CT chest does show improvement but residual postinflammatory changes.  We will need to continue to follow very closely.  Would consider a spirometry with DLCO in 6 months if indicated.  For now would advance activity as tolerated.  Did discuss pulmonary rehab however he wants to hold off on this at this time.   Chronic respiratory failure with hypoxia (HCC) Patient's oxygen demands have decreased.  Now maintaining O2 saturations with activity without desaturations.  We will set up overnight oximetry test to see if he still needs nocturnal oxygen.  DVT (deep venous thrombosis) ([place]) DVT during critical illness with COVID-19.  Patient is to continue on Eliquis for a provoked DVT-6 months of therapy.  Preoperative clearance Pulmonary preoperative clearance.- Discussion with patient and wife  that patient is a moderate to high risk given his age, underlying nocturnal oxygen dependence and postinflammatory fibrosis however he needs to proceed with surgical resection for melanoma. From a pulmonary standpoint he is cleared.   Major Pulmonary risks identified in the multifactorial risk analysis are but not limited to a) pneumonia; b) recurrent intubation risk; c) prolonged or recurrent acute respiratory failure needing mechanical ventilation; d) prolonged hospitalization; e) DVT/Pulmonary embolism; f) Acute Pulmonary edema  Recommend 1. Short duration of surgery as much as possible and avoid paralytic if possible 2. Recovery in step down or ICU with Pulmonary consultation if indicated.  3. DVT prophylaxis- will need to adjust Eliquis per facility protocol for surgery .  4. Aggressive pulmonary toilet with o2, bronchodilatation, and incentive spirometry and early ambulation as able.         Rexene Edison, NP 07/23/2019

## 2019-07-23 NOTE — Assessment & Plan Note (Signed)
Pulmonary preoperative clearance.- Discussion with patient and wife that patient is a moderate to high risk given his age, underlying nocturnal oxygen dependence and postinflammatory fibrosis however he needs to proceed with surgical resection for melanoma. From a pulmonary standpoint he is cleared.   Major Pulmonary risks identified in the multifactorial risk analysis are but not limited to a) pneumonia; b) recurrent intubation risk; c) prolonged or recurrent acute respiratory failure needing mechanical ventilation; d) prolonged hospitalization; e) DVT/Pulmonary embolism; f) Acute Pulmonary edema  Recommend 1. Short duration of surgery as much as possible and avoid paralytic if possible 2. Recovery in step down or ICU with Pulmonary consultation if indicated.  3. DVT prophylaxis- will need to adjust Eliquis per facility protocol for surgery .  4. Aggressive pulmonary toilet with o2, bronchodilatation, and incentive spirometry and early ambulation as able.

## 2019-07-27 ENCOUNTER — Telehealth: Payer: Self-pay | Admitting: Adult Health

## 2019-07-27 DIAGNOSIS — E1122 Type 2 diabetes mellitus with diabetic chronic kidney disease: Secondary | ICD-10-CM | POA: Diagnosis not present

## 2019-07-27 DIAGNOSIS — H409 Unspecified glaucoma: Secondary | ICD-10-CM | POA: Diagnosis not present

## 2019-07-27 DIAGNOSIS — N401 Enlarged prostate with lower urinary tract symptoms: Secondary | ICD-10-CM | POA: Diagnosis not present

## 2019-07-27 DIAGNOSIS — N182 Chronic kidney disease, stage 2 (mild): Secondary | ICD-10-CM | POA: Diagnosis not present

## 2019-07-27 DIAGNOSIS — E7800 Pure hypercholesterolemia, unspecified: Secondary | ICD-10-CM | POA: Diagnosis not present

## 2019-07-27 DIAGNOSIS — I10 Essential (primary) hypertension: Secondary | ICD-10-CM | POA: Diagnosis not present

## 2019-07-27 NOTE — Telephone Encounter (Signed)
LMTCB x 1 

## 2019-07-27 NOTE — Telephone Encounter (Signed)
That is fine patient had requested to discontinue oxygen.  The test was to confirm that he no longer needed oxygen at bedtime he can discuss this on return with Dr. Carlis Abbott at his follow-up

## 2019-07-27 NOTE — Telephone Encounter (Signed)
Tammy Parrett put in an order for ONO on RA for this patient and the order was sent to [facility].  See there message below  Ferrin Shaff DOB 06/02/1945 - Parrett, Tammy - ONO   Hey guys,   Rcvd an order for ONO on RA for this patient. 05/04 called pt to let him know I have it in the office ready to be picked up, no answer lvm. 05/07 called again today, pt advised he did not authorize this order to be sent to [place] and does not need. I have voided out this order, just wanted to make you aware.   Halina Maidens [facility]

## 2019-07-28 ENCOUNTER — Other Ambulatory Visit (HOSPITAL_COMMUNITY): Payer: Medicare HMO

## 2019-07-30 ENCOUNTER — Telehealth: Payer: Self-pay | Admitting: Adult Health

## 2019-07-30 DIAGNOSIS — U071 COVID-19: Secondary | ICD-10-CM | POA: Diagnosis not present

## 2019-07-30 NOTE — Telephone Encounter (Signed)
Called and spoke with patient.  Let him know Tammy's recommendations.  Patient voiced understanding Nothing further needed at this time

## 2019-07-30 NOTE — Telephone Encounter (Signed)
Patient was set up for an overnight oximetry test on room air to make sure he is not having nocturnal desaturations I can discontinue his oxygen .  Patient did not understand why this test was being done.  Patient education was given.  He has approved that they may come out to do this overnight oximetry test please call the homecare company and let them know to proceed.

## 2019-07-31 ENCOUNTER — Ambulatory Visit: Payer: Medicare HMO | Admitting: Critical Care Medicine

## 2019-07-31 NOTE — Telephone Encounter (Signed)
Can you please resend this ONO order to DME. Thank you.

## 2019-08-01 NOTE — Telephone Encounter (Signed)
I have resent this order to [facility]

## 2019-08-01 NOTE — Telephone Encounter (Signed)
Sent order to [facility] high piority [place]

## 2019-08-01 NOTE — Telephone Encounter (Signed)
I received this message back from Charmian Muff, Dolly Rias, Wyatt Haste  Thank you, will process.   Janett Billow

## 2019-08-02 ENCOUNTER — Encounter: Payer: Self-pay | Admitting: Adult Health

## 2019-08-02 DIAGNOSIS — R0902 Hypoxemia: Secondary | ICD-10-CM | POA: Diagnosis not present

## 2019-08-02 DIAGNOSIS — S83242A Other tear of medial meniscus, current injury, left knee, initial encounter: Secondary | ICD-10-CM | POA: Diagnosis not present

## 2019-08-02 DIAGNOSIS — J449 Chronic obstructive pulmonary disease, unspecified: Secondary | ICD-10-CM | POA: Diagnosis not present

## 2019-08-10 DIAGNOSIS — M353 Polymyalgia rheumatica: Secondary | ICD-10-CM | POA: Diagnosis not present

## 2019-08-10 DIAGNOSIS — Z8616 Personal history of COVID-19: Secondary | ICD-10-CM | POA: Diagnosis not present

## 2019-08-10 DIAGNOSIS — I10 Essential (primary) hypertension: Secondary | ICD-10-CM | POA: Diagnosis not present

## 2019-08-10 DIAGNOSIS — Z01818 Encounter for other preprocedural examination: Secondary | ICD-10-CM | POA: Diagnosis not present

## 2019-08-10 DIAGNOSIS — I8290 Acute embolism and thrombosis of unspecified vein: Secondary | ICD-10-CM | POA: Diagnosis not present

## 2019-08-10 NOTE — Telephone Encounter (Addendum)
Per Tammy - very minimal desats, discontinue night time O2.

## 2019-08-14 DIAGNOSIS — E119 Type 2 diabetes mellitus without complications: Secondary | ICD-10-CM | POA: Diagnosis not present

## 2019-08-14 DIAGNOSIS — Z85828 Personal history of other malignant neoplasm of skin: Secondary | ICD-10-CM | POA: Diagnosis not present

## 2019-08-14 DIAGNOSIS — D0339 Melanoma in situ of other parts of face: Secondary | ICD-10-CM | POA: Diagnosis not present

## 2019-08-14 DIAGNOSIS — J8410 Pulmonary fibrosis, unspecified: Secondary | ICD-10-CM | POA: Diagnosis not present

## 2019-08-14 DIAGNOSIS — M353 Polymyalgia rheumatica: Secondary | ICD-10-CM | POA: Diagnosis not present

## 2019-08-14 DIAGNOSIS — C434 Malignant melanoma of scalp and neck: Secondary | ICD-10-CM | POA: Diagnosis not present

## 2019-08-14 DIAGNOSIS — Z8616 Personal history of COVID-19: Secondary | ICD-10-CM | POA: Diagnosis not present

## 2019-08-14 DIAGNOSIS — Z79899 Other long term (current) drug therapy: Secondary | ICD-10-CM | POA: Diagnosis not present

## 2019-08-14 DIAGNOSIS — C439 Malignant melanoma of skin, unspecified: Secondary | ICD-10-CM | POA: Diagnosis not present

## 2019-08-14 DIAGNOSIS — R0602 Shortness of breath: Secondary | ICD-10-CM | POA: Diagnosis not present

## 2019-08-14 DIAGNOSIS — D759 Disease of blood and blood-forming organs, unspecified: Secondary | ICD-10-CM | POA: Diagnosis not present

## 2019-08-14 DIAGNOSIS — C4339 Malignant melanoma of other parts of face: Secondary | ICD-10-CM | POA: Diagnosis not present

## 2019-08-14 DIAGNOSIS — I10 Essential (primary) hypertension: Secondary | ICD-10-CM | POA: Diagnosis not present

## 2019-08-22 ENCOUNTER — Telehealth: Payer: Self-pay | Admitting: Adult Health

## 2019-08-22 NOTE — Telephone Encounter (Signed)
This is improved.  Can d/c nocturnal O2.  ONO- shows mild desats with only 10 min. Of O2 sat<88% (10hr>88%).  Per Rexene Edison NP

## 2019-08-24 DIAGNOSIS — Z09 Encounter for follow-up examination after completed treatment for conditions other than malignant neoplasm: Secondary | ICD-10-CM | POA: Diagnosis not present

## 2019-08-24 DIAGNOSIS — C4339 Malignant melanoma of other parts of face: Secondary | ICD-10-CM | POA: Diagnosis not present

## 2019-08-30 DIAGNOSIS — S83242D Other tear of medial meniscus, current injury, left knee, subsequent encounter: Secondary | ICD-10-CM | POA: Diagnosis not present

## 2019-08-31 DIAGNOSIS — Z09 Encounter for follow-up examination after completed treatment for conditions other than malignant neoplasm: Secondary | ICD-10-CM | POA: Diagnosis not present

## 2019-08-31 DIAGNOSIS — C434 Malignant melanoma of scalp and neck: Secondary | ICD-10-CM | POA: Diagnosis not present

## 2019-09-03 DIAGNOSIS — R69 Illness, unspecified: Secondary | ICD-10-CM | POA: Diagnosis not present

## 2019-09-05 ENCOUNTER — Encounter: Payer: Self-pay | Admitting: Critical Care Medicine

## 2019-09-05 ENCOUNTER — Ambulatory Visit: Payer: Medicare HMO | Admitting: Critical Care Medicine

## 2019-09-05 ENCOUNTER — Other Ambulatory Visit: Payer: Self-pay

## 2019-09-05 VITALS — BP 120/70 | HR 68 | Temp 97.9°F | Ht 70.0 in | Wt 165.2 lb

## 2019-09-05 DIAGNOSIS — I77810 Thoracic aortic ectasia: Secondary | ICD-10-CM

## 2019-09-05 DIAGNOSIS — J8410 Pulmonary fibrosis, unspecified: Secondary | ICD-10-CM | POA: Diagnosis not present

## 2019-09-05 DIAGNOSIS — Z8616 Personal history of COVID-19: Secondary | ICD-10-CM | POA: Diagnosis not present

## 2019-09-05 NOTE — Progress Notes (Signed)
Synopsis: Referred in April 2021 for post-covid by Dustin Orn, MD.  Subjective:   PATIENT ID: Dustin Diaz GENDER: male DOB: 1946/01/23, MRN: [id number]  Chief Complaint  Patient presents with  . Follow-up    post covid: SOB on exertion. Does not need oxygen anymore.    Dustin Diaz is a 74 y/o gentleman who presents for follow-up for chronic dyspnea on exertion post Covid viral pneumonia.  He has continued to improve slowly but is not back to his previous baseline.  His wife notices more improvement than he has.  He formerly was able to do yard work for 4 to 5 hours at a time.  He has been having someone else cut his 7 acre yard for him, and he can do things for about 30 minutes at a time.  He was able to do mulching in his yard last week.  He has had some seasonal allergies, for which he takes Flonase with mild improvement.  He had his follow-up at [facility] for melanoma resection with sentinel lymph nodes.  All lymph nodes were negative and melanoma was in situ only.  He continues on Eliquis without complication.  Planning on completing his course at the end of June.    OV 06/29/19: Dustin Diaz is a 74 year old gentleman who presents for chronic respiratory failure and shortness of breath post Covid pneumonia.  He is accompanied today by his wife.  He was hospitalized from 1/14-2/11, where he was treated with remdesivir, steroids, and 2 doses of Tocilizumab.  He was discharged on his home prednisone regimen for polymyalgia rheumatica and 4 L of supplemental oxygen.  He lost about 20 pounds while he was hospitalized, but has gained most of it back.  He is no longer requiring oxygen during the day with saturations remaining above 90, but he continues to use 2 L of supplemental oxygen at night.  His hospitalization was complicated by a DVT, for which he remains on Eliquis.  He was prescribed a Combivent inhaler at discharge which she is used infrequently without significant benefit.  His  shortness of breath has slowly improved, but he is not back to his baseline.  He has an occasional dry cough that wakes him up at night, but it does not persist.  No wheezing.  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  He completed a course of acyclovir and has had improvement in the pain associated with these lesions, especially the one on his lower lip.  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  He will be going to [facility] for evaluation of this and may have to have lymph node biopsies performed.  Previous tobacco abuse-quit around 1980 after 20 years x <0.5ppd.  No previous lung disease prior to hospitalization.  05/31/2019 PCP note reviewed, Dustin Diaz. Hospital discharge summary reviewed.    Past Medical History:  Diagnosis Date  . AAA (abdominal aortic aneurysm) ([place])   . CKD (chronic kidney disease)   . Colon polyp   . Diabetes mellitus without complication ([place])   . Diabetic retinopathy ([place])   . Glaucoma   . High cholesterol   . Hyperlipemia   . Hypertension   . Melanoma ([place])    scalp  . Polymyalgia rheumatica ([facility])      Family History  Problem Relation Age of Onset  . Heart failure Father      Past Surgical History:  Procedure Laterality Date  .  APPENDECTOMY  1956  . BACK SURGERY  1988  . SKIN SURGERY  2012   melanoma removal  . TONSILLECTOMY      Social History   Socioeconomic History  . Marital status: Married    Spouse name: Dustin Diaz  . Number of children: 3  . Years of education: college  . Highest education level: Not on file  Occupational History  . Occupation: Retired  Tobacco Use  . Smoking status: Former Smoker    Packs/day: 0.50    Types: Cigarettes    Quit date: 06/29/1979    Years since quitting: 40.2  . Smokeless tobacco: Former Systems developer    Types: Snuff    Quit date: 03/31/2019  Vaping Use  . Vaping Use: Never used  Substance and Sexual Activity  . Alcohol  use: Yes    Comment: occ: 1 beer monthly  . Drug use: No  . Sexual activity: Not on file  Other Topics Concern  . Not on file  Social History Narrative   Pt lives at home with his spouse.    Caffeine Use-3-4 cups daily   Social Determinants of Health   Financial Resource Strain:   . Difficulty of Paying Living Expenses:   Food Insecurity:   . Worried About Charity fundraiser in the Last Year:   . Arboriculturist in the Last Year:   Transportation Needs:   . Film/video editor (Medical):   Marland Kitchen Lack of Transportation (Non-Medical):   Physical Activity:   . Days of Exercise per Week:   . Minutes of Exercise per Session:   Stress:   . Feeling of Stress :   Social Connections:   . Frequency of Communication with Friends and Family:   . Frequency of Social Gatherings with Friends and Family:   . Attends Religious Services:   . Active Member of Clubs or Organizations:   . Attends Archivist Meetings:   Marland Kitchen Marital Status:   Intimate Partner Violence:   . Fear of Current or Ex-Partner:   . Emotionally Abused:   Marland Kitchen Physically Abused:   . Sexually Abused:      Allergies  Allergen Reactions  . Codeine      Immunization History  Administered Date(s) Administered  . Fluad Quad(high Dose 65+) 12/12/2018  . PFIZER SARS-COV-2 Vaccination 05/21/2019, 06/11/2019    Outpatient Medications Prior to Visit  Medication Sig Dispense Refill  . apixaban (ELIQUIS) 5 MG TABS tablet Take 1 tablet (5 mg total) by mouth 2 (two) times daily. 60 tablet 4  . atorvastatin (LIPITOR) 10 MG tablet Take 10 mg by mouth daily.    . feeding supplement, GLUCERNA SHAKE, (GLUCERNA SHAKE) LIQD Take 237 mLs by mouth 3 (three) times daily between meals.    . fluticasone (FLONASE) 50 MCG/ACT nasal spray Place 2 sprays into the nose daily as needed for allergies.     Marland Kitchen glimepiride (AMARYL) 2 MG tablet Take 2 mg by mouth every morning. 2 tablets in am    . JARDIANCE 25 MG TABS tablet Take 25 mg by  mouth every morning.    . latanoprost (XALATAN) 0.005 % ophthalmic solution Place 1 drop into both eyes at bedtime.    . metFORMIN (GLUCOPHAGE-XR) 500 MG 24 hr tablet Take 2,000 mg by mouth every morning.    . predniSONE (DELTASONE) 1 MG tablet Take 3 mg by mouth daily.     Marland Kitchen acyclovir ointment (ZOVIRAX) 5 % Apply topically in the morning, at noon,  in the evening, and at bedtime. (Patient not taking: Reported on 09/05/2019) 5 g 0  . Ipratropium-Albuterol (COMBIVENT) 20-100 MCG/ACT AERS respimat Inhale 1 puff into the lungs every 6 (six) hours as needed for wheezing. 4 g 1   No facility-administered medications prior to visit.    Review of Systems  Constitutional: Negative for chills and fever.       Gaining back weight  Respiratory: Positive for cough and shortness of breath. Negative for sputum production and wheezing.   Cardiovascular: Negative for chest pain and leg swelling.  Gastrointestinal: Negative for blood in stool, heartburn, melena, nausea and vomiting.  Neurological: Negative for focal weakness.       Lip tingling from ulcer  Endo/Heme/Allergies: Does not bruise/bleed easily.     Objective:   Vitals:   09/05/19 0907  BP: 120/70  Pulse: 68  Temp: 97.9 F (36.6 C)  TempSrc: Oral  SpO2: 98%  Weight: 165 lb 3.2 oz (74.9 kg)  Height: 5\' 10"  (1.778 m)   98% on   RA BMI Readings from Last 3 Encounters:  09/05/19 23.70 kg/m  07/23/19 23.56 kg/m  06/29/19 23.19 kg/m   Wt Readings from Last 3 Encounters:  09/05/19 165 lb 3.2 oz (74.9 kg)  07/23/19 164 lb 3.2 oz (74.5 kg)  06/29/19 161 lb 9.6 oz (73.3 kg)    Physical Exam Vitals reviewed.  Constitutional:      General: He is not in acute distress.    Appearance: He is not ill-appearing.  HENT:     Head: Normocephalic and atraumatic.  Eyes:     General: No scleral icterus. Cardiovascular:     Rate and Rhythm: Normal rate and regular rhythm.  Pulmonary:     Comments: Breathing comfortably on room air, no  conversational dyspnea.  Bibasilar rales, otherwise clear to auscultation. Abdominal:     General: There is no distension.     Palpations: Abdomen is soft.     Tenderness: There is no abdominal tenderness.  Musculoskeletal:        General: No swelling or deformity.     Cervical back: Neck supple.  Lymphadenopathy:     Cervical: No cervical adenopathy.  Skin:    General: Skin is warm and dry.     Findings: No rash.  Neurological:     General: No focal deficit present.     Mental Status: He is alert.     Coordination: Coordination normal.  Psychiatric:        Mood and Affect: Mood normal.        Behavior: Behavior normal.      CBC    Component Value Date/Time   WBC 8.5 04/27/2019 0421   RBC 4.26 04/27/2019 0421   HGB 12.7 (L) 04/27/2019 0421   HCT 39.7 04/27/2019 0421   PLT 213 04/27/2019 0421   MCV 93.2 04/27/2019 0421   MCH 29.8 04/27/2019 0421   MCHC 32.0 04/27/2019 0421   RDW 14.6 04/27/2019 0421   LYMPHSABS 1.3 04/25/2019 0447   MONOABS 1.1 (H) 04/25/2019 0447   EOSABS 0.6 (H) 04/25/2019 0447   BASOSABS 0.1 04/25/2019 0447    CHEMISTRY No results for input(s): NA, K, CL, CO2, GLUCOSE, BUN, CREATININE, CALCIUM, MG, PHOS in the last 168 hours. CrCl cannot be calculated (Patient's most recent lab result is older than the maximum 21 days allowed.).   Chest Imaging- films reviewed: CXR, 2 view 05/29/2019- fibrosis bilaterally, R>L  CT chest 04/20/2019- mild right paratracheal and hilar adenopathy.  Right greater than left opacities with prominent intern intralobular septal thickening, mild groundglass.  More dense consolidation in the bases and throughout the right upper lobe.  HRCT chest 07/04/19-peripherally predominant fibrosis with traction bronchiectasis in all lobes, right greater than left.  Ectatic ascending thoracic aorta, 4.2 cm in diameter.  No significant mediastinal or hilar adenopathy.  No air trapping.  Compared to previous CT scan, there is improvement in  groundglass opacities with residual fibrosis in similar areas.   Pulmonary Functions Testing Results: PFT Results Latest Ref Rng & Units 07/23/2019  FVC-Pre L 2.27  FVC-Predicted Pre % 52  FVC-Post L 2.22  FVC-Predicted Post % 50  Pre FEV1/FVC % % 90  Post FEV1/FCV % % 91  FEV1-Pre L 2.04  FEV1-Predicted Pre % 64  FEV1-Post L 2.03  DLCO UNC% % 53  DLCO COR %Predicted % 108  TLC L 3.84  TLC % Predicted % 54  RV % Predicted % 61   2021- No significant obstruction or bronchodilator reversibility.  Moderate restriction in diffusion impairment at present.      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 History of COVID-19  Z86.16   3. 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 -Continue regular physical activity.  Anticipate his symptoms will continue to improve the same trajectory.  He should let us know if symptoms worsen at any point. -Needs repeat HRCT chest in April 2022 to determine if he has progressive fibrosis.  Provoked DVT due to Covid -Agree with discontinuation of Eliquis at the end of June as planned  Seasonal allergies -Continue Flonase. -Can add Allegra once daily as needed.  Too sedated with Claritin.  Ectatic thoracic aorta -Annual CT follow up in April 2022   RTC in 6 months.     Current Outpatient Medications:  .  apixaban (ELIQUIS) 5 MG TABS tablet, Take 1 tablet (5 mg total) by mouth 2 (two) times daily., Disp: 60 tablet, Rfl: 4 .  atorvastatin (LIPITOR) 10 MG tablet, Take 10 mg by mouth daily., Disp: , Rfl:  .  feeding supplement, GLUCERNA SHAKE, (GLUCERNA SHAKE) LIQD, Take 237 mLs by mouth 3 (three) times daily between meals., Disp: , Rfl:  .  fluticasone (FLONASE) 50 MCG/ACT nasal spray, Place 2 sprays into the nose daily as needed for allergies. , Disp: , Rfl:  .  glimepiride (AMARYL) 2 MG tablet, Take 2 mg by mouth  every morning. 2 tablets in am, Disp: , Rfl:  .  JARDIANCE 25 MG TABS tablet, Take 25 mg by mouth every morning., Disp: , Rfl:  .  latanoprost (XALATAN) 0.005 % ophthalmic solution, Place 1 drop into both eyes at bedtime., Disp: , Rfl:  .  metFORMIN (GLUCOPHAGE-XR) 500 MG 24 hr tablet, Take 2,000 mg by mouth every morning., Disp: , Rfl:  .  predniSONE (DELTASONE) 1 MG tablet, Take 3 mg by mouth daily. , Disp: , Rfl:  .  acyclovir ointment (ZOVIRAX) 5 %, Apply topically in the morning, at noon, in the evening, and at bedtime. (Patient not taking: Reported on 09/05/2019), Disp: 5 g, Rfl: 0     Julian Hy, DO  Pulmonary Critical Care 09/05/2019 9:10 AM

## 2019-09-05 NOTE — Patient Instructions (Addendum)
Thank you for visiting Dr. Carlis Abbott at [facility]. We recommend the following:   Return in about 6 months (around 03/06/2020).    Please do your part to reduce the spread of COVID-19.

## 2019-09-07 DIAGNOSIS — E113293 Type 2 diabetes mellitus with mild nonproliferative diabetic retinopathy without macular edema, bilateral: Secondary | ICD-10-CM | POA: Diagnosis not present

## 2019-09-07 DIAGNOSIS — H2513 Age-related nuclear cataract, bilateral: Secondary | ICD-10-CM | POA: Diagnosis not present

## 2019-09-07 DIAGNOSIS — H353131 Nonexudative age-related macular degeneration, bilateral, early dry stage: Secondary | ICD-10-CM | POA: Diagnosis not present

## 2019-09-07 DIAGNOSIS — H40013 Open angle with borderline findings, low risk, bilateral: Secondary | ICD-10-CM | POA: Diagnosis not present

## 2019-09-17 DIAGNOSIS — Z09 Encounter for follow-up examination after completed treatment for conditions other than malignant neoplasm: Secondary | ICD-10-CM | POA: Diagnosis not present

## 2019-09-17 DIAGNOSIS — C434 Malignant melanoma of scalp and neck: Secondary | ICD-10-CM | POA: Diagnosis not present

## 2019-10-23 DIAGNOSIS — T1502XA Foreign body in cornea, left eye, initial encounter: Secondary | ICD-10-CM | POA: Diagnosis not present

## 2019-10-23 DIAGNOSIS — H2513 Age-related nuclear cataract, bilateral: Secondary | ICD-10-CM | POA: Diagnosis not present

## 2019-10-23 DIAGNOSIS — E113293 Type 2 diabetes mellitus with mild nonproliferative diabetic retinopathy without macular edema, bilateral: Secondary | ICD-10-CM | POA: Diagnosis not present

## 2019-10-26 DIAGNOSIS — H353131 Nonexudative age-related macular degeneration, bilateral, early dry stage: Secondary | ICD-10-CM | POA: Diagnosis not present

## 2019-10-26 DIAGNOSIS — H35033 Hypertensive retinopathy, bilateral: Secondary | ICD-10-CM | POA: Diagnosis not present

## 2019-10-26 DIAGNOSIS — H2513 Age-related nuclear cataract, bilateral: Secondary | ICD-10-CM | POA: Diagnosis not present

## 2019-10-26 DIAGNOSIS — E113293 Type 2 diabetes mellitus with mild nonproliferative diabetic retinopathy without macular edema, bilateral: Secondary | ICD-10-CM | POA: Diagnosis not present

## 2019-11-05 ENCOUNTER — Other Ambulatory Visit: Payer: Self-pay | Admitting: Internal Medicine

## 2019-11-05 DIAGNOSIS — I10 Essential (primary) hypertension: Secondary | ICD-10-CM | POA: Diagnosis not present

## 2019-11-05 DIAGNOSIS — M353 Polymyalgia rheumatica: Secondary | ICD-10-CM | POA: Diagnosis not present

## 2019-11-05 DIAGNOSIS — N182 Chronic kidney disease, stage 2 (mild): Secondary | ICD-10-CM | POA: Diagnosis not present

## 2019-11-05 DIAGNOSIS — J8410 Pulmonary fibrosis, unspecified: Secondary | ICD-10-CM | POA: Diagnosis not present

## 2019-11-05 DIAGNOSIS — E113299 Type 2 diabetes mellitus with mild nonproliferative diabetic retinopathy without macular edema, unspecified eye: Secondary | ICD-10-CM | POA: Diagnosis not present

## 2019-11-05 DIAGNOSIS — E1122 Type 2 diabetes mellitus with diabetic chronic kidney disease: Secondary | ICD-10-CM | POA: Diagnosis not present

## 2019-11-05 DIAGNOSIS — K862 Cyst of pancreas: Secondary | ICD-10-CM | POA: Diagnosis not present

## 2019-11-05 DIAGNOSIS — E113293 Type 2 diabetes mellitus with mild nonproliferative diabetic retinopathy without macular edema, bilateral: Secondary | ICD-10-CM | POA: Diagnosis not present

## 2019-12-03 ENCOUNTER — Ambulatory Visit
Admission: RE | Admit: 2019-12-03 | Discharge: 2019-12-03 | Disposition: A | Discharge status: Home / Self Care | Payer: Medicare HMO | Source: Ambulatory Visit | Attending: Internal Medicine | Admitting: Internal Medicine

## 2019-12-03 DIAGNOSIS — M353 Polymyalgia rheumatica: Secondary | ICD-10-CM

## 2019-12-03 DIAGNOSIS — K8689 Other specified diseases of pancreas: Secondary | ICD-10-CM | POA: Diagnosis not present

## 2019-12-03 DIAGNOSIS — K862 Cyst of pancreas: Secondary | ICD-10-CM | POA: Diagnosis not present

## 2019-12-03 DIAGNOSIS — N281 Cyst of kidney, acquired: Secondary | ICD-10-CM | POA: Diagnosis not present

## 2019-12-03 MED ORDER — GADOBENATE DIMEGLUMINE 529 MG/ML IV SOLN
15.0000 mL | Freq: Once | INTRAVENOUS | Status: AC | PRN
  Administered 2019-12-03: 15 mL via INTRAVENOUS

## 2019-12-21 DIAGNOSIS — U071 COVID-19: Secondary | ICD-10-CM | POA: Diagnosis not present

## 2019-12-21 DIAGNOSIS — Z20828 Contact with and (suspected) exposure to other viral communicable diseases: Secondary | ICD-10-CM | POA: Diagnosis not present

## 2019-12-24 DIAGNOSIS — B0089 Other herpesviral infection: Secondary | ICD-10-CM | POA: Diagnosis not present

## 2019-12-24 DIAGNOSIS — L570 Actinic keratosis: Secondary | ICD-10-CM | POA: Diagnosis not present

## 2019-12-24 DIAGNOSIS — Z1283 Encounter for screening for malignant neoplasm of skin: Secondary | ICD-10-CM | POA: Diagnosis not present

## 2019-12-24 DIAGNOSIS — Z85820 Personal history of malignant melanoma of skin: Secondary | ICD-10-CM | POA: Diagnosis not present

## 2019-12-24 DIAGNOSIS — Z08 Encounter for follow-up examination after completed treatment for malignant neoplasm: Secondary | ICD-10-CM | POA: Diagnosis not present

## 2019-12-24 DIAGNOSIS — D225 Melanocytic nevi of trunk: Secondary | ICD-10-CM | POA: Diagnosis not present

## 2019-12-24 DIAGNOSIS — D034 Melanoma in situ of scalp and neck: Secondary | ICD-10-CM | POA: Diagnosis not present

## 2019-12-24 DIAGNOSIS — X32XXXD Exposure to sunlight, subsequent encounter: Secondary | ICD-10-CM | POA: Diagnosis not present

## 2019-12-27 ENCOUNTER — Telehealth: Payer: Self-pay | Admitting: Critical Care Medicine

## 2019-12-27 NOTE — Telephone Encounter (Signed)
Spoke with pt . He c/p increased SOB with exertion x 1 month. Denies SOB at rest. PT does not have pulse ox to check sats at this time. Pt denies cough, wheezing or chest discomfort. PT had negative covid test last week. Would like appt to see Dr Carlis Abbott. 1st available is 01/02/20 2:30. Pt is good with this appt. Pt instructed to call back with sooner appt with app or go to ER if symptoms worsen. Pt verbalized understanding.

## 2020-01-02 ENCOUNTER — Other Ambulatory Visit: Payer: Self-pay

## 2020-01-02 ENCOUNTER — Ambulatory Visit: Payer: Medicare HMO | Admitting: Critical Care Medicine

## 2020-01-02 ENCOUNTER — Encounter: Payer: Self-pay | Admitting: Critical Care Medicine

## 2020-01-02 VITALS — BP 138/62 | HR 90 | Temp 98.2°F | Ht 70.0 in | Wt 167.6 lb

## 2020-01-02 DIAGNOSIS — Z8616 Personal history of COVID-19: Secondary | ICD-10-CM | POA: Diagnosis not present

## 2020-01-02 DIAGNOSIS — D034 Melanoma in situ of scalp and neck: Secondary | ICD-10-CM | POA: Diagnosis not present

## 2020-01-02 DIAGNOSIS — J8410 Pulmonary fibrosis, unspecified: Secondary | ICD-10-CM

## 2020-01-02 DIAGNOSIS — Z86718 Personal history of other venous thrombosis and embolism: Secondary | ICD-10-CM | POA: Diagnosis not present

## 2020-01-02 NOTE — Progress Notes (Signed)
Synopsis: Referred in April 2021 for post-covid by Lavone Orn, MD.  Subjective:   PATIENT ID: Dustin Diaz GENDER: male DOB: 02-24-1946, MRN: [id number]  Chief Complaint  Patient presents with  . Follow-up    shortness of breath with activity    Dustin Diaz is a 74 y/o gentleman with a history of post-covid fibrosis who presents for follow-up.  Dustin Diaz is accompanied today by his wife.  Over the past few months Dustin Diaz has noticed that Dustin Diaz is getting progressively worse dyspnea on exertion.  His activity still not limited because Dustin Diaz pushes through it, but his wife relates that Dustin Diaz gets short of breath when Dustin Diaz is standing stove cooking dinner.  Dustin Diaz has an upcoming fishing trip in the mountains where Dustin Diaz will be walking a short distance to get stream.  She is concerned about his ability to do this.  No desaturations detected at home.  Dustin Diaz has an upcoming surgery at [facility] for new melanomas on his scalp and they found; this will require anesthesia.  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 Completed AC for DVT- now off. No swelling or complications since stopping. Recently his allergies have been worse and Dustin Diaz has been unable to stop allegra due to recurrent nasal congestion.    OV 09/05/19: Dustin Diaz is a 74 y/o gentleman who presents for follow-up for chronic dyspnea on exertion post Covid viral pneumonia.  Dustin Diaz has continued to improve slowly but is not back to his previous baseline.  His wife notices more improvement than Dustin Diaz has.  Dustin Diaz formerly was able to do yard work for 4 to 5 hours at a time.  Dustin Diaz has been having someone else cut his 7 acre yard for him, and Dustin Diaz can do things for about 30 minutes at a time.  Dustin Diaz was able to do mulching in his yard last week.  Dustin Diaz has had some seasonal allergies, for which Dustin Diaz takes Flonase with mild improvement.  Dustin Diaz had his follow-up at [facility] for melanoma resection with sentinel lymph nodes.  All lymph nodes were negative and melanoma was in situ only.  Dustin Diaz continues on  Eliquis without complication.  Planning on completing his course at the end of June.   OV 06/29/19: Dustin Diaz is a 74 year old gentleman who presents for chronic respiratory failure and shortness of breath post Covid pneumonia.  Dustin Diaz is accompanied today by his wife.  Dustin Diaz was hospitalized from 1/14-2/11, where Dustin Diaz was treated with remdesivir, steroids, and 2 doses of Tocilizumab.  Dustin Diaz was discharged on his home prednisone regimen for polymyalgia rheumatica and 4 L of supplemental oxygen.  Dustin Diaz lost about 20 pounds while Dustin Diaz was hospitalized, but has gained most of it back.  Dustin Diaz is no longer requiring oxygen during the day with saturations remaining above 90, but Dustin Diaz continues to use 2 L of supplemental oxygen at night.  His hospitalization was complicated by a DVT, for which Dustin Diaz remains on Eliquis.  Dustin Diaz was prescribed a Combivent inhaler at discharge which she is used infrequently without significant benefit.  His shortness of breath has slowly improved, but Dustin Diaz is not back to his baseline.  Dustin Diaz has an occasional dry cough that wakes him up at night, but it does not persist.  No wheezing.  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  Dustin Diaz completed a course of acyclovir and has had improvement in the pain associated with these lesions, especially the one  on his lower lip.  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  Dustin Diaz will be going to [facility] for evaluation of this and may have to have lymph node biopsies performed.  Previous tobacco abuse-quit around 1980 after 20 years x <0.5ppd.  No previous lung disease prior to hospitalization.  05/31/2019 PCP note reviewed, Dr. Laurann Montana. Hospital discharge summary reviewed.    Past Medical History:  Diagnosis Date  . AAA (abdominal aortic aneurysm) ([place])   . CKD (chronic kidney disease)   . Colon polyp   . Diabetes mellitus without complication ([place])   . Diabetic retinopathy ([place])   .  Glaucoma   . High cholesterol   . Hyperlipemia   . Hypertension   . Melanoma ([place])    scalp  . Polymyalgia rheumatica (HCC)      Family History  Problem Relation Age of Onset  . Heart failure Father      Past Surgical History:  Procedure Laterality Date  . APPENDECTOMY  1956  . BACK SURGERY  1988  . SKIN SURGERY  2012   melanoma removal  . TONSILLECTOMY      Social History   Socioeconomic History  . Marital status: Married    Spouse name: Velva Harman  . Number of children: 3  . Years of education: college  . Highest education level: Not on file  Occupational History  . Occupation: Retired  Tobacco Use  . Smoking status: Former Smoker    Packs/day: 0.50    Types: Cigarettes    Quit date: 06/29/1979    Years since quitting: 40.5  . Smokeless tobacco: Former Systems developer    Types: Snuff    Quit date: 03/31/2019  Vaping Use  . Vaping Use: Never used  Substance and Sexual Activity  . Alcohol use: Yes    Comment: occ: 1 beer monthly  . Drug use: No  . Sexual activity: Not on file  Other Topics Concern  . Not on file  Social History Narrative   Pt lives at home with his spouse.    Caffeine Use-3-4 cups daily   Social Determinants of Health   Financial Resource Strain:   . Difficulty of Paying Living Expenses: Not on file  Food Insecurity:   . Worried About Charity fundraiser in the Last Year: Not on file  . Ran Out of Food in the Last Year: Not on file  Transportation Needs:   . Lack of Transportation (Medical): Not on file  . Lack of Transportation (Non-Medical): Not on file  Physical Activity:   . Days of Exercise per Week: Not on file  . Minutes of Exercise per Session: Not on file  Stress:   . Feeling of Stress : Not on file  Social Connections:   . Frequency of Communication with Friends and Family: Not on file  . Frequency of Social Gatherings with Friends and Family: Not on file  . Attends Religious Services: Not on file  . Active Member of Clubs or  Organizations: Not on file  . Attends Archivist Meetings: Not on file  . Marital Status: Not on file  Intimate Partner Violence:   . Fear of Current or Ex-Partner: Not on file  . Emotionally Abused: Not on file  . Physically Abused: Not on file  . Sexually Abused: Not on file     Allergies  Allergen Reactions  . Codeine Nausea Only     Immunization History  Administered Date(s) Administered  . Fluad  Quad(high Dose 65+) 12/12/2018  . PFIZER SARS-COV-2 Vaccination 05/21/2019, 06/11/2019    Outpatient Medications Prior to Visit  Medication Sig Dispense Refill  . atorvastatin (LIPITOR) 10 MG tablet Take 10 mg by mouth daily.    . fluticasone (FLONASE) 50 MCG/ACT nasal spray Place 2 sprays into the nose daily as needed for allergies.     Marland Kitchen glimepiride (AMARYL) 2 MG tablet Take 2 mg by mouth every morning. 2 tablets in am    . JARDIANCE 25 MG TABS tablet Take 25 mg by mouth every morning.    . latanoprost (XALATAN) 0.005 % ophthalmic solution Place 1 drop into both eyes at bedtime.    . metFORMIN (GLUCOPHAGE-XR) 500 MG 24 hr tablet Take 2,000 mg by mouth every morning.    . predniSONE (DELTASONE) 1 MG tablet Take 3 mg by mouth daily.     Marland Kitchen acyclovir ointment (ZOVIRAX) 5 % Apply topically in the morning, at noon, in the evening, and at bedtime. (Patient not taking: Reported on 01/02/2020) 5 g 0  . apixaban (ELIQUIS) 5 MG TABS tablet Take 1 tablet (5 mg total) by mouth 2 (two) times daily. (Patient not taking: Reported on 01/02/2020) 60 tablet 4  . feeding supplement, GLUCERNA SHAKE, (GLUCERNA SHAKE) LIQD Take 237 mLs by mouth 3 (three) times daily between meals. (Patient not taking: Reported on 01/02/2020)     No facility-administered medications prior to visit.    Review of Systems  Constitutional: Negative for chills and fever.       Gaining back weight  Respiratory: Positive for cough and shortness of breath. Negative for sputum production and wheezing.    Cardiovascular: Negative for chest pain and leg swelling.  Gastrointestinal: Negative for blood in stool, heartburn, melena, nausea and vomiting.  Neurological: Negative for focal weakness.       Lip tingling from ulcer  Endo/Heme/Allergies: Does not bruise/bleed easily.     Objective:   Vitals:   01/02/20 1620  BP: 138/62  Pulse: 90  Temp: 98.2 F (36.8 C)  TempSrc: Other (Comment)  SpO2: 96%  Weight: 167 lb 9.6 oz (76 kg)  Height: 5\' 10"  (1.778 m)   96% on   RA BMI Readings from Last 3 Encounters:  01/02/20 24.05 kg/m  09/05/19 23.70 kg/m  07/23/19 23.56 kg/m   Wt Readings from Last 3 Encounters:  01/02/20 167 lb 9.6 oz (76 kg)  09/05/19 165 lb 3.2 oz (74.9 kg)  07/23/19 164 lb 3.2 oz (74.5 kg)    Physical Exam Vitals reviewed.  Constitutional:      General: Dustin Diaz is not in acute distress.    Appearance: Dustin Diaz is not ill-appearing.  HENT:     Head: Normocephalic and atraumatic.  Eyes:     General: No scleral icterus. Cardiovascular:     Rate and Rhythm: Normal rate and regular rhythm.     Heart sounds: No murmur heard.   Pulmonary:     Comments: Clear to auscultation bilaterally, breathing comfortably on room air Abdominal:     General: There is no distension.     Palpations: Abdomen is soft.  Musculoskeletal:        General: No swelling.     Cervical back: Neck supple.  Lymphadenopathy:     Cervical: No cervical adenopathy.  Skin:    General: Skin is warm and dry.     Findings: No rash.  Neurological:     General: No focal deficit present.     Mental Status: Dustin Diaz is  alert.     Coordination: Coordination normal.  Psychiatric:        Mood and Affect: Mood normal.        Behavior: Behavior normal.      CBC    Component Value Date/Time   WBC 8.5 04/27/2019 0421   RBC 4.26 04/27/2019 0421   HGB 12.7 (L) 04/27/2019 0421   HCT 39.7 04/27/2019 0421   PLT 213 04/27/2019 0421   MCV 93.2 04/27/2019 0421   MCH 29.8 04/27/2019 0421   MCHC 32.0  04/27/2019 0421   RDW 14.6 04/27/2019 0421   LYMPHSABS 1.3 04/25/2019 0447   MONOABS 1.1 (H) 04/25/2019 0447   EOSABS 0.6 (H) 04/25/2019 0447   BASOSABS 0.1 04/25/2019 0447    CHEMISTRY No results for input(s): NA, K, CL, CO2, GLUCOSE, BUN, CREATININE, CALCIUM, MG, PHOS in the last 168 hours. CrCl cannot be calculated (Patient's most recent lab result is older than the maximum 21 days allowed.).   Chest Imaging- films reviewed: CXR, 2 view 05/29/2019- fibrosis bilaterally, R>L  CT chest 04/20/2019- mild right paratracheal and hilar adenopathy.  Right greater than left opacities with prominent intern intralobular septal thickening, mild groundglass.  More dense consolidation in the bases and throughout the right upper lobe.  HRCT chest 07/04/19-peripherally predominant fibrosis with traction bronchiectasis in all lobes, right greater than left.  Ectatic ascending thoracic aorta, 4.2 cm in diameter.  No significant mediastinal or hilar adenopathy.  No air trapping.  Compared to previous CT scan, there is improvement in groundglass opacities with residual fibrosis in similar areas.   Pulmonary Functions Testing Results: PFT Results Latest Ref Rng & Units 07/23/2019  FVC-Pre L 2.27  FVC-Predicted Pre % 52  FVC-Post L 2.22  FVC-Predicted Post % 50  Pre FEV1/FVC % % 90  Post FEV1/FCV % % 91  FEV1-Pre L 2.04  FEV1-Predicted Pre % 64  FEV1-Post L 2.03  DLCO uncorrected ml/min/mmHg 13.58  DLCO UNC% % 53  DLCO corrected ml/min/mmHg 13.58  DLCO COR %Predicted % 53  DLVA Predicted % 108  TLC L 3.84  TLC % Predicted % 54  RV % Predicted % 61   2021- No significant obstruction or bronchodilator reversibility.  Moderate restriction in diffusion impairment at present.      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 History of COVID-19  Z86.16   3. History of DVT (deep vein thrombosis)  Z86.718     ILD post COVID-19 viral pneumonia;  progressive symptoms over the past few months raises concern for progressive fibrosis. -Repeat HRCT chest and PFTs.  May be an appropriate candidate for antifibrotic therapy. -Continue physical activity as tolerated.  Walked in the office today-low saturation 92% with walking.  Does not qualify for oxygen at this point. -Okay for him to take his fishing trip with appropriate precautions.  I agree for his plan to do a trial run first. -Okay for anesthesia for melanoma resection.  I do not think that delaying this will improve his chances of tolerating anesthesia given the concern for progressive fibrosis, which would be irreversible antifibrotic therapy. -Refused flu shot today.  Provoked DVT due to Covid -Completed treatment; no additional follow-up required  Seasonal allergies -Continue Allegra daily as needed  Ectatic thoracic aorta -Annual CT follow up in April 2022   RTC in 1 month with Dr. Vaughan Browner or Loop.     Current Outpatient Medications:  .  atorvastatin (  LIPITOR) 10 MG tablet, Take 10 mg by mouth daily., Disp: , Rfl:  .  fluticasone (FLONASE) 50 MCG/ACT nasal spray, Place 2 sprays into the nose daily as needed for allergies. , Disp: , Rfl:  .  glimepiride (AMARYL) 2 MG tablet, Take 2 mg by mouth every morning. 2 tablets in am, Disp: , Rfl:  .  JARDIANCE 25 MG TABS tablet, Take 25 mg by mouth every morning., Disp: , Rfl:  .  latanoprost (XALATAN) 0.005 % ophthalmic solution, Place 1 drop into both eyes at bedtime., Disp: , Rfl:  .  metFORMIN (GLUCOPHAGE-XR) 500 MG 24 hr tablet, Take 2,000 mg by mouth every morning., Disp: , Rfl:  .  predniSONE (DELTASONE) 1 MG tablet, Take 3 mg by mouth daily. , Disp: , Rfl:      Julian Hy, DO [facility] 01/02/2020 4:23 PM

## 2020-01-02 NOTE — Patient Instructions (Addendum)
Thank you for visiting Dr. Carlis Abbott at [facility]. We recommend the following: Orders Placed This Encounter  Procedures  . CT Chest High Resolution  . Pulmonary function test   Orders Placed This Encounter  Procedures  . CT Chest High Resolution    First available    Standing Status:   Future    Standing Expiration Date:   01/01/2021    Order Specific Question:   Preferred imaging location?    Answer:   [facility]  . Pulmonary function test    Standing Status:   Future    Standing Expiration Date:   01/01/2021    Order Specific Question:   Where should this test be performed?    Answer:   [facility]    Order Specific Question:   Full PFT: includes the following: basic spirometry, spirometry pre & post bronchodilator, diffusion capacity (DLCO), lung volumes    Answer:   Full PFT      Return in about 6 months (around 07/02/2020). with Dr. Vaughan Browner or Ramaswamy (30 minutes visit).    Please do your part to reduce the spread of COVID-19.

## 2020-01-07 DIAGNOSIS — D485 Neoplasm of uncertain behavior of skin: Secondary | ICD-10-CM | POA: Diagnosis not present

## 2020-01-09 ENCOUNTER — Other Ambulatory Visit: Payer: Medicare HMO

## 2020-01-11 ENCOUNTER — Other Ambulatory Visit: Payer: Self-pay

## 2020-01-11 ENCOUNTER — Ambulatory Visit (INDEPENDENT_AMBULATORY_CARE_PROVIDER_SITE_OTHER)
Admission: RE | Admit: 2020-01-11 | Discharge: 2020-01-11 | Disposition: A | Discharge status: Home / Self Care | Payer: Medicare HMO | Source: Ambulatory Visit | Attending: Critical Care Medicine | Admitting: Critical Care Medicine

## 2020-01-11 DIAGNOSIS — J479 Bronchiectasis, uncomplicated: Secondary | ICD-10-CM | POA: Diagnosis not present

## 2020-01-11 DIAGNOSIS — J8410 Pulmonary fibrosis, unspecified: Secondary | ICD-10-CM

## 2020-01-11 DIAGNOSIS — I313 Pericardial effusion (noninflammatory): Secondary | ICD-10-CM | POA: Diagnosis not present

## 2020-01-11 DIAGNOSIS — I712 Thoracic aortic aneurysm, without rupture: Secondary | ICD-10-CM | POA: Diagnosis not present

## 2020-01-11 DIAGNOSIS — R0609 Other forms of dyspnea: Secondary | ICD-10-CM | POA: Diagnosis not present

## 2020-01-11 DIAGNOSIS — Z8616 Personal history of COVID-19: Secondary | ICD-10-CM

## 2020-01-12 DIAGNOSIS — Z01812 Encounter for preprocedural laboratory examination: Secondary | ICD-10-CM | POA: Diagnosis not present

## 2020-01-12 DIAGNOSIS — Z20822 Contact with and (suspected) exposure to covid-19: Secondary | ICD-10-CM | POA: Diagnosis not present

## 2020-01-13 NOTE — Progress Notes (Signed)
Please let Mr. Mcelhannon know that his CT scan shows persistent abnormalities but actually looks a little better than before. This does not explain worsening SOB. We will proceed with PFTs.   He should know that he has 2 new small spine compression fractures on his CT scan that he should follow up with his PCP about. This likely indicates osteoporosis.  Thanks!  LCP

## 2020-01-14 NOTE — Progress Notes (Signed)
Called and spoke with patient about CT result per Dr Carlis Abbott. All questions answered and patient expressed full understanding of result note and to follow up with PCP per Dr Carlis Abbott for the 2 new small spine compression fractures. Confirmed scheduled PFT on 01/30/2020 at 11am and reminded patient to bring [place] Vaccination card with him to appointment. Nothing further needed at this time.

## 2020-01-15 DIAGNOSIS — E785 Hyperlipidemia, unspecified: Secondary | ICD-10-CM | POA: Diagnosis not present

## 2020-01-15 DIAGNOSIS — Z85820 Personal history of malignant melanoma of skin: Secondary | ICD-10-CM | POA: Diagnosis not present

## 2020-01-15 DIAGNOSIS — Z01818 Encounter for other preprocedural examination: Secondary | ICD-10-CM | POA: Diagnosis not present

## 2020-01-15 DIAGNOSIS — E119 Type 2 diabetes mellitus without complications: Secondary | ICD-10-CM | POA: Diagnosis not present

## 2020-01-15 DIAGNOSIS — Z86718 Personal history of other venous thrombosis and embolism: Secondary | ICD-10-CM | POA: Diagnosis not present

## 2020-01-15 DIAGNOSIS — D034 Melanoma in situ of scalp and neck: Secondary | ICD-10-CM | POA: Diagnosis not present

## 2020-01-15 DIAGNOSIS — Z885 Allergy status to narcotic agent status: Secondary | ICD-10-CM | POA: Diagnosis not present

## 2020-01-15 DIAGNOSIS — Z87891 Personal history of nicotine dependence: Secondary | ICD-10-CM | POA: Diagnosis not present

## 2020-01-15 DIAGNOSIS — Z8616 Personal history of COVID-19: Secondary | ICD-10-CM | POA: Diagnosis not present

## 2020-01-15 DIAGNOSIS — I2510 Atherosclerotic heart disease of native coronary artery without angina pectoris: Secondary | ICD-10-CM | POA: Diagnosis not present

## 2020-01-15 DIAGNOSIS — Z79899 Other long term (current) drug therapy: Secondary | ICD-10-CM | POA: Diagnosis not present

## 2020-01-28 DIAGNOSIS — Z09 Encounter for follow-up examination after completed treatment for conditions other than malignant neoplasm: Secondary | ICD-10-CM | POA: Diagnosis not present

## 2020-01-28 DIAGNOSIS — D034 Melanoma in situ of scalp and neck: Secondary | ICD-10-CM | POA: Diagnosis not present

## 2020-01-30 ENCOUNTER — Ambulatory Visit (INDEPENDENT_AMBULATORY_CARE_PROVIDER_SITE_OTHER): Payer: Medicare HMO | Admitting: Critical Care Medicine

## 2020-01-30 ENCOUNTER — Other Ambulatory Visit: Payer: Self-pay

## 2020-01-30 DIAGNOSIS — Z8616 Personal history of COVID-19: Secondary | ICD-10-CM

## 2020-01-30 DIAGNOSIS — J8410 Pulmonary fibrosis, unspecified: Secondary | ICD-10-CM

## 2020-01-30 NOTE — Progress Notes (Signed)
PFT done today. 

## 2020-01-31 LAB — PULMONARY FUNCTION TEST
DL/VA % pred: 119 %
DL/VA: 4.76 ml/min/mmHg/L
DLCO cor % pred: 67 %
DLCO cor: 17.29 ml/min/mmHg
DLCO unc % pred: 67 %
DLCO unc: 17.29 ml/min/mmHg
FEF 25-75 Post: 3.93 L/sec
FEF 25-75 Pre: 3.34 L/sec
FEF2575-%Change-Post: 17 %
FEF2575-%Pred-Post: 169 %
FEF2575-%Pred-Pre: 143 %
FEV1-%Change-Post: 3 %
FEV1-%Pred-Post: 69 %
FEV1-%Pred-Pre: 66 %
FEV1-Post: 2.17 L
FEV1-Pre: 2.1 L
FEV1FVC-%Change-Post: 5 %
FEV1FVC-%Pred-Pre: 119 %
FEV6-%Change-Post: -1 %
FEV6-%Pred-Post: 58 %
FEV6-%Pred-Pre: 59 %
FEV6-Post: 2.37 L
FEV6-Pre: 2.41 L
FEV6FVC-%Pred-Post: 106 %
FEV6FVC-%Pred-Pre: 106 %
FVC-%Change-Post: -1 %
FVC-%Pred-Post: 55 %
FVC-%Pred-Pre: 55 %
FVC-Post: 2.37 L
FVC-Pre: 2.41 L
Post FEV1/FVC ratio: 91 %
Post FEV6/FVC ratio: 100 %
Pre FEV1/FVC ratio: 87 %
Pre FEV6/FVC Ratio: 100 %
RV % pred: 57 %
RV: 1.44 L
TLC % pred: 56 %
TLC: 3.97 L

## 2020-02-04 DIAGNOSIS — D034 Melanoma in situ of scalp and neck: Secondary | ICD-10-CM | POA: Diagnosis not present

## 2020-02-12 ENCOUNTER — Ambulatory Visit: Payer: Medicare HMO | Admitting: Pulmonary Disease

## 2020-02-12 ENCOUNTER — Encounter: Payer: Self-pay | Admitting: Pulmonary Disease

## 2020-02-12 ENCOUNTER — Other Ambulatory Visit: Payer: Self-pay

## 2020-02-12 VITALS — BP 144/68 | HR 68 | Temp 97.6°F | Ht 70.0 in | Wt 167.0 lb

## 2020-02-12 DIAGNOSIS — Z8616 Personal history of COVID-19: Secondary | ICD-10-CM | POA: Diagnosis not present

## 2020-02-12 DIAGNOSIS — J8410 Pulmonary fibrosis, unspecified: Secondary | ICD-10-CM

## 2020-02-12 NOTE — Patient Instructions (Signed)
I have reviewed your CT and PFTs which show continued slow improvement in lung function which is good news I would recommend a gradual exercise regimen at home We will continue to monitor with high-res CT and PFTs in 6 months  Follow-up in clinic after these tests.

## 2020-02-12 NOTE — Progress Notes (Signed)
Dustin Diaz    [id number]    12-31-1945  Primary Care Physician:Griffin, Jenny Reichmann, MD  Referring Physician: Lavone Orn, MD [address] [place],  [place] [address]  Chief complaint: Follow-up for post COVID-38  HPI: 74 year old with polymyalgia rheumatica, diabetes, hyperlipidemia, hypertension, melanoma Hospitalized with COVID-19 from 1/14-2/11/21, where he was treated with remdesivir, steroids, and 2 doses of Tocilizumab.  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  He complains of dyspnea on exertion, no cough, fevers, wheezing. Reports that over the past few weeks he starting to feel better.  Has gone back to fishing and recently completed a fishing trip out of state.  Pets: Dogs Occupation: Retired Engineer, maintenance (IT) Exposures: No known exposures.  No mold, hot tub, Jacuzzi.  No feather pillows or comforters Smoking history: Quit smoking in the 1970s Travel history: Previously lived in [place], [place], [place] Relevant family history: No significant family issue of lung disease   Outpatient Encounter Medications as of 02/12/2020  Medication Sig  . atorvastatin (LIPITOR) 10 MG tablet Take 10 mg by mouth daily.  . fexofenadine (ALLEGRA) 180 MG tablet Take by mouth daily as needed.   . fluticasone (FLONASE) 50 MCG/ACT nasal spray Place 2 sprays into the nose daily as needed for allergies.   Marland Kitchen glimepiride (AMARYL) 2 MG tablet Take 2 mg by mouth every morning. 2 tablets in am  . JARDIANCE 25 MG TABS tablet Take 25 mg by mouth every morning.  . latanoprost (XALATAN) 0.005 % ophthalmic solution Place 1 drop into both eyes at bedtime.  . metFORMIN  (GLUCOPHAGE-XR) 500 MG 24 hr tablet Take 2,000 mg by mouth every morning.  . predniSONE (DELTASONE) 1 MG tablet Take 3 mg by mouth daily.   . valACYclovir (VALTREX) 1000 MG tablet Take by mouth.   No facility-administered encounter medications on file as of 02/12/2020.    Allergies as of 02/12/2020 - Review Complete 02/12/2020  Allergen Reaction Noted  . Codeine Nausea Only 07/24/2012    Past Medical History:  Diagnosis Date  . AAA (abdominal aortic aneurysm) ([place])   . CKD (chronic kidney disease)   . Colon polyp   . Diabetes mellitus without complication ([place])   . Diabetic retinopathy ([place])   . Glaucoma   . High cholesterol   . Hyperlipemia   . Hypertension   . Melanoma ([place])    scalp  . Polymyalgia rheumatica (HCC)     Past Surgical History:  Procedure Laterality Date  . APPENDECTOMY  1956  . BACK SURGERY  1988  . SKIN SURGERY  2012   melanoma removal  . TONSILLECTOMY      Family History  Problem Relation Age of Onset  . Heart failure Father     Social History   Socioeconomic History  . Marital status: Married    Spouse name: Velva Harman  . Number of children: 3  . Years of education: college  . Highest education level: Not on file  Occupational History  . Occupation: Retired  Tobacco Use  . Smoking status: Former Smoker    Packs/day: 0.50    Types: Cigarettes    Quit date:  06/29/1979    Years since quitting: 40.6  . Smokeless tobacco: Former Systems developer    Types: Snuff    Quit date: 03/31/2019  Vaping Use  . Vaping Use: Never used  Substance and Sexual Activity  . Alcohol use: Yes    Comment: occ: 1 beer monthly  . Drug use: No  . Sexual activity: Not on file  Other Topics Concern  . Not on file  Social History Narrative   Pt lives at home with his spouse.    Caffeine Use-3-4 cups daily   Social Determinants of Health   Financial Resource Strain:   . Difficulty of Paying Living Expenses: Not on file  Food Insecurity:   . Worried About Sales executive in the Last Year: Not on file  . Ran Out of Food in the Last Year: Not on file  Transportation Needs:   . Lack of Transportation (Medical): Not on file  . Lack of Transportation (Non-Medical): Not on file  Physical Activity:   . Days of Exercise per Week: Not on file  . Minutes of Exercise per Session: Not on file  Stress:   . Feeling of Stress : Not on file  Social Connections:   . Frequency of Communication with Friends and Family: Not on file  . Frequency of Social Gatherings with Friends and Family: Not on file  . Attends Religious Services: Not on file  . Active Member of Clubs or Organizations: Not on file  . Attends Archivist Meetings: Not on file  . Marital Status: Not on file  Intimate Partner Violence:   . Fear of Current or Ex-Partner: Not on file  . Emotionally Abused: Not on file  . Physically Abused: Not on file  . Sexually Abused: Not on file    Review of systems: Review of Systems  Constitutional: Negative for fever and chills.  HENT: Negative.   Eyes: Negative for blurred vision.  Respiratory: as per HPI  Cardiovascular: Negative for chest pain and palpitations.  Gastrointestinal: Negative for vomiting, diarrhea, blood per rectum. Genitourinary: Negative for dysuria, urgency, frequency and hematuria.  Musculoskeletal: Negative for myalgias, back pain and joint pain.  Skin: Negative for itching and rash.  Neurological: Negative for dizziness, tremors, focal weakness, seizures and loss of consciousness.  Endo/Heme/Allergies: Negative for environmental allergies.  Psychiatric/Behavioral: Negative for depression, suicidal ideas and hallucinations.  All other systems reviewed and are negative.  Physical Exam: Blood pressure (!) 144/68, pulse 68, temperature 97.6 F (36.4 C), temperature source Skin, height 5\' 10"  (1.778 m), weight 167 lb (75.8 kg), SpO2 98 %. Gen:      No acute distress HEENT:  EOMI, sclera anicteric Neck:     No masses; no  thyromegaly Lungs:    Clear to auscultation bilaterally; normal respiratory effort CV:         Regular rate and rhythm; no murmurs Abd:      + bowel sounds; soft, non-tender; no palpable masses, no distension Ext:    No edema; adequate peripheral perfusion Skin:      Warm and dry; no rash Neuro: alert and oriented x 3 Psych: normal mood and affect  Data Reviewed: Imaging: CTA 04/09/2019-no PE, severe airspace disease CT chest 04/20/19-persistent bilateral diffuse airspace disease, cystic lesion in the pancreas CT high-resolution 07/04/2019-improvement in groundglass opacities, bilateral fibrotic changes and probable UIP pattern CT high-resolution 01/11/2020-minimal decrease in pulmonary fibrosis I have reviewed images personally.  PFTs: 01/30/20 FVC 2.37 [4 5%], FEV1 2.17 [69%],  F/F 91, TLC 3.97 [56%], DLCO 17.29 [69%] Moderate restriction with mild diffusion defect.  Overall improvement in lung volumes and diffusion capacity compared to April 2021  Labs:  Assessment:  Post Covid pulmonary fibrosis Continues to have dyspnea on exertion. CT and PFTs reviewed with continued gradual improvement. Set expectations for slow recovery over time.  At this point I do not see any role for antifibrotics unless there is worsening on follow-up  Reviewed additional CT findings of coronary atherosclerosis and LAD lesion.  He has an appointment coming up with his primary care and will discuss this then  Follow-up CT PFTs in 6 months.  Plan/Recommendations: High-res CT, PFTs in 6 months  Marshell Garfinkel MD [facility] 02/12/2020, 9:52 AM  CC: Lavone Orn, MD

## 2020-02-18 DIAGNOSIS — D034 Melanoma in situ of scalp and neck: Secondary | ICD-10-CM | POA: Diagnosis not present

## 2020-03-10 DIAGNOSIS — H353131 Nonexudative age-related macular degeneration, bilateral, early dry stage: Secondary | ICD-10-CM | POA: Diagnosis not present

## 2020-03-10 DIAGNOSIS — H35033 Hypertensive retinopathy, bilateral: Secondary | ICD-10-CM | POA: Diagnosis not present

## 2020-03-10 DIAGNOSIS — H40053 Ocular hypertension, bilateral: Secondary | ICD-10-CM | POA: Diagnosis not present

## 2020-03-10 DIAGNOSIS — H02831 Dermatochalasis of right upper eyelid: Secondary | ICD-10-CM | POA: Diagnosis not present

## 2020-03-10 DIAGNOSIS — H40013 Open angle with borderline findings, low risk, bilateral: Secondary | ICD-10-CM | POA: Diagnosis not present

## 2020-03-12 DIAGNOSIS — I70203 Unspecified atherosclerosis of native arteries of extremities, bilateral legs: Secondary | ICD-10-CM | POA: Diagnosis not present

## 2020-03-12 DIAGNOSIS — R69 Illness, unspecified: Secondary | ICD-10-CM | POA: Diagnosis not present

## 2020-03-12 DIAGNOSIS — N179 Acute kidney failure, unspecified: Secondary | ICD-10-CM | POA: Diagnosis not present

## 2020-03-24 DIAGNOSIS — Z0000 Encounter for general adult medical examination without abnormal findings: Secondary | ICD-10-CM | POA: Diagnosis not present

## 2020-03-24 DIAGNOSIS — Z86010 Personal history of colonic polyps: Secondary | ICD-10-CM | POA: Diagnosis not present

## 2020-03-24 DIAGNOSIS — E113293 Type 2 diabetes mellitus with mild nonproliferative diabetic retinopathy without macular edema, bilateral: Secondary | ICD-10-CM | POA: Diagnosis not present

## 2020-03-24 DIAGNOSIS — E113299 Type 2 diabetes mellitus with mild nonproliferative diabetic retinopathy without macular edema, unspecified eye: Secondary | ICD-10-CM | POA: Diagnosis not present

## 2020-03-24 DIAGNOSIS — Z8616 Personal history of COVID-19: Secondary | ICD-10-CM | POA: Diagnosis not present

## 2020-03-24 DIAGNOSIS — Z23 Encounter for immunization: Secondary | ICD-10-CM | POA: Diagnosis not present

## 2020-03-24 DIAGNOSIS — I700 Atherosclerosis of aorta: Secondary | ICD-10-CM | POA: Diagnosis not present

## 2020-03-24 DIAGNOSIS — I10 Essential (primary) hypertension: Secondary | ICD-10-CM | POA: Diagnosis not present

## 2020-03-24 DIAGNOSIS — Z1389 Encounter for screening for other disorder: Secondary | ICD-10-CM | POA: Diagnosis not present

## 2020-03-24 DIAGNOSIS — N401 Enlarged prostate with lower urinary tract symptoms: Secondary | ICD-10-CM | POA: Diagnosis not present

## 2020-03-24 DIAGNOSIS — J8410 Pulmonary fibrosis, unspecified: Secondary | ICD-10-CM | POA: Diagnosis not present

## 2020-03-24 DIAGNOSIS — N182 Chronic kidney disease, stage 2 (mild): Secondary | ICD-10-CM | POA: Diagnosis not present

## 2020-03-24 DIAGNOSIS — E1122 Type 2 diabetes mellitus with diabetic chronic kidney disease: Secondary | ICD-10-CM | POA: Diagnosis not present

## 2020-03-24 DIAGNOSIS — M353 Polymyalgia rheumatica: Secondary | ICD-10-CM | POA: Diagnosis not present

## 2020-04-01 DIAGNOSIS — H2511 Age-related nuclear cataract, right eye: Secondary | ICD-10-CM | POA: Diagnosis not present

## 2020-04-15 DIAGNOSIS — E113293 Type 2 diabetes mellitus with mild nonproliferative diabetic retinopathy without macular edema, bilateral: Secondary | ICD-10-CM | POA: Diagnosis not present

## 2020-04-15 DIAGNOSIS — Z7984 Long term (current) use of oral hypoglycemic drugs: Secondary | ICD-10-CM | POA: Diagnosis not present

## 2020-04-21 DIAGNOSIS — E1122 Type 2 diabetes mellitus with diabetic chronic kidney disease: Secondary | ICD-10-CM | POA: Diagnosis not present

## 2020-04-21 DIAGNOSIS — H409 Unspecified glaucoma: Secondary | ICD-10-CM | POA: Diagnosis not present

## 2020-04-21 DIAGNOSIS — N182 Chronic kidney disease, stage 2 (mild): Secondary | ICD-10-CM | POA: Diagnosis not present

## 2020-04-21 DIAGNOSIS — N401 Enlarged prostate with lower urinary tract symptoms: Secondary | ICD-10-CM | POA: Diagnosis not present

## 2020-04-21 DIAGNOSIS — I10 Essential (primary) hypertension: Secondary | ICD-10-CM | POA: Diagnosis not present

## 2020-04-21 DIAGNOSIS — E7800 Pure hypercholesterolemia, unspecified: Secondary | ICD-10-CM | POA: Diagnosis not present

## 2020-04-30 DIAGNOSIS — E1122 Type 2 diabetes mellitus with diabetic chronic kidney disease: Secondary | ICD-10-CM | POA: Diagnosis not present

## 2020-04-30 DIAGNOSIS — N182 Chronic kidney disease, stage 2 (mild): Secondary | ICD-10-CM | POA: Diagnosis not present

## 2020-04-30 DIAGNOSIS — H409 Unspecified glaucoma: Secondary | ICD-10-CM | POA: Diagnosis not present

## 2020-04-30 DIAGNOSIS — N401 Enlarged prostate with lower urinary tract symptoms: Secondary | ICD-10-CM | POA: Diagnosis not present

## 2020-04-30 DIAGNOSIS — I10 Essential (primary) hypertension: Secondary | ICD-10-CM | POA: Diagnosis not present

## 2020-04-30 DIAGNOSIS — E7800 Pure hypercholesterolemia, unspecified: Secondary | ICD-10-CM | POA: Diagnosis not present

## 2020-05-06 DIAGNOSIS — H2511 Age-related nuclear cataract, right eye: Secondary | ICD-10-CM | POA: Diagnosis not present

## 2020-05-12 DIAGNOSIS — Z1283 Encounter for screening for malignant neoplasm of skin: Secondary | ICD-10-CM | POA: Diagnosis not present

## 2020-05-12 DIAGNOSIS — Z85820 Personal history of malignant melanoma of skin: Secondary | ICD-10-CM | POA: Diagnosis not present

## 2020-05-12 DIAGNOSIS — X32XXXD Exposure to sunlight, subsequent encounter: Secondary | ICD-10-CM | POA: Diagnosis not present

## 2020-05-12 DIAGNOSIS — L720 Epidermal cyst: Secondary | ICD-10-CM | POA: Diagnosis not present

## 2020-05-12 DIAGNOSIS — D225 Melanocytic nevi of trunk: Secondary | ICD-10-CM | POA: Diagnosis not present

## 2020-05-12 DIAGNOSIS — L570 Actinic keratosis: Secondary | ICD-10-CM | POA: Diagnosis not present

## 2020-05-12 DIAGNOSIS — L905 Scar conditions and fibrosis of skin: Secondary | ICD-10-CM | POA: Diagnosis not present

## 2020-05-12 DIAGNOSIS — D485 Neoplasm of uncertain behavior of skin: Secondary | ICD-10-CM | POA: Diagnosis not present

## 2020-05-12 DIAGNOSIS — H2512 Age-related nuclear cataract, left eye: Secondary | ICD-10-CM | POA: Diagnosis not present

## 2020-05-12 DIAGNOSIS — Z08 Encounter for follow-up examination after completed treatment for malignant neoplasm: Secondary | ICD-10-CM | POA: Diagnosis not present

## 2020-05-13 DIAGNOSIS — N411 Chronic prostatitis: Secondary | ICD-10-CM | POA: Diagnosis not present

## 2020-06-03 DIAGNOSIS — H2512 Age-related nuclear cataract, left eye: Secondary | ICD-10-CM | POA: Diagnosis not present

## 2020-06-10 DIAGNOSIS — D126 Benign neoplasm of colon, unspecified: Secondary | ICD-10-CM | POA: Diagnosis not present

## 2020-06-10 DIAGNOSIS — J8410 Pulmonary fibrosis, unspecified: Secondary | ICD-10-CM | POA: Diagnosis not present

## 2020-06-10 DIAGNOSIS — E113293 Type 2 diabetes mellitus with mild nonproliferative diabetic retinopathy without macular edema, bilateral: Secondary | ICD-10-CM | POA: Diagnosis not present

## 2020-06-10 DIAGNOSIS — R0600 Dyspnea, unspecified: Secondary | ICD-10-CM | POA: Diagnosis not present

## 2020-06-10 DIAGNOSIS — Z7984 Long term (current) use of oral hypoglycemic drugs: Secondary | ICD-10-CM | POA: Diagnosis not present

## 2020-06-13 DIAGNOSIS — E7800 Pure hypercholesterolemia, unspecified: Secondary | ICD-10-CM | POA: Diagnosis not present

## 2020-06-13 DIAGNOSIS — E1122 Type 2 diabetes mellitus with diabetic chronic kidney disease: Secondary | ICD-10-CM | POA: Diagnosis not present

## 2020-06-13 DIAGNOSIS — I10 Essential (primary) hypertension: Secondary | ICD-10-CM | POA: Diagnosis not present

## 2020-06-13 DIAGNOSIS — N182 Chronic kidney disease, stage 2 (mild): Secondary | ICD-10-CM | POA: Diagnosis not present

## 2020-06-13 DIAGNOSIS — H409 Unspecified glaucoma: Secondary | ICD-10-CM | POA: Diagnosis not present

## 2020-06-13 DIAGNOSIS — N401 Enlarged prostate with lower urinary tract symptoms: Secondary | ICD-10-CM | POA: Diagnosis not present

## 2020-06-24 DIAGNOSIS — I10 Essential (primary) hypertension: Secondary | ICD-10-CM | POA: Diagnosis not present

## 2020-06-24 DIAGNOSIS — M353 Polymyalgia rheumatica: Secondary | ICD-10-CM | POA: Diagnosis not present

## 2020-06-24 DIAGNOSIS — E7800 Pure hypercholesterolemia, unspecified: Secondary | ICD-10-CM | POA: Diagnosis not present

## 2020-06-24 DIAGNOSIS — N401 Enlarged prostate with lower urinary tract symptoms: Secondary | ICD-10-CM | POA: Diagnosis not present

## 2020-06-24 DIAGNOSIS — N182 Chronic kidney disease, stage 2 (mild): Secondary | ICD-10-CM | POA: Diagnosis not present

## 2020-06-24 DIAGNOSIS — E113293 Type 2 diabetes mellitus with mild nonproliferative diabetic retinopathy without macular edema, bilateral: Secondary | ICD-10-CM | POA: Diagnosis not present

## 2020-07-07 DIAGNOSIS — N401 Enlarged prostate with lower urinary tract symptoms: Secondary | ICD-10-CM | POA: Diagnosis not present

## 2020-07-14 DIAGNOSIS — R350 Frequency of micturition: Secondary | ICD-10-CM | POA: Diagnosis not present

## 2020-07-14 DIAGNOSIS — N411 Chronic prostatitis: Secondary | ICD-10-CM | POA: Diagnosis not present

## 2020-07-14 DIAGNOSIS — N401 Enlarged prostate with lower urinary tract symptoms: Secondary | ICD-10-CM | POA: Diagnosis not present

## 2020-07-14 DIAGNOSIS — Z0100 Encounter for examination of eyes and vision without abnormal findings: Secondary | ICD-10-CM | POA: Diagnosis not present

## 2020-07-17 DIAGNOSIS — I10 Essential (primary) hypertension: Secondary | ICD-10-CM | POA: Diagnosis not present

## 2020-07-17 DIAGNOSIS — E7800 Pure hypercholesterolemia, unspecified: Secondary | ICD-10-CM | POA: Diagnosis not present

## 2020-07-17 DIAGNOSIS — N182 Chronic kidney disease, stage 2 (mild): Secondary | ICD-10-CM | POA: Diagnosis not present

## 2020-07-17 DIAGNOSIS — H409 Unspecified glaucoma: Secondary | ICD-10-CM | POA: Diagnosis not present

## 2020-07-17 DIAGNOSIS — N401 Enlarged prostate with lower urinary tract symptoms: Secondary | ICD-10-CM | POA: Diagnosis not present

## 2020-07-17 DIAGNOSIS — E1122 Type 2 diabetes mellitus with diabetic chronic kidney disease: Secondary | ICD-10-CM | POA: Diagnosis not present

## 2020-07-21 DIAGNOSIS — R0600 Dyspnea, unspecified: Secondary | ICD-10-CM | POA: Diagnosis not present

## 2020-07-21 DIAGNOSIS — M353 Polymyalgia rheumatica: Secondary | ICD-10-CM | POA: Diagnosis not present

## 2020-07-21 DIAGNOSIS — E113293 Type 2 diabetes mellitus with mild nonproliferative diabetic retinopathy without macular edema, bilateral: Secondary | ICD-10-CM | POA: Diagnosis not present

## 2020-07-21 DIAGNOSIS — I10 Essential (primary) hypertension: Secondary | ICD-10-CM | POA: Diagnosis not present

## 2020-08-11 ENCOUNTER — Other Ambulatory Visit: Payer: Self-pay

## 2020-08-11 ENCOUNTER — Ambulatory Visit (INDEPENDENT_AMBULATORY_CARE_PROVIDER_SITE_OTHER)
Admission: RE | Admit: 2020-08-11 | Discharge: 2020-08-11 | Disposition: A | Discharge status: Home / Self Care | Payer: Medicare HMO | Source: Ambulatory Visit | Attending: Pulmonary Disease | Admitting: Pulmonary Disease

## 2020-08-11 DIAGNOSIS — J8410 Pulmonary fibrosis, unspecified: Secondary | ICD-10-CM | POA: Diagnosis not present

## 2020-08-11 DIAGNOSIS — R0602 Shortness of breath: Secondary | ICD-10-CM | POA: Diagnosis not present

## 2020-08-12 DIAGNOSIS — H409 Unspecified glaucoma: Secondary | ICD-10-CM | POA: Diagnosis not present

## 2020-08-12 DIAGNOSIS — N182 Chronic kidney disease, stage 2 (mild): Secondary | ICD-10-CM | POA: Diagnosis not present

## 2020-08-12 DIAGNOSIS — E1122 Type 2 diabetes mellitus with diabetic chronic kidney disease: Secondary | ICD-10-CM | POA: Diagnosis not present

## 2020-08-12 DIAGNOSIS — E7800 Pure hypercholesterolemia, unspecified: Secondary | ICD-10-CM | POA: Diagnosis not present

## 2020-08-12 DIAGNOSIS — I10 Essential (primary) hypertension: Secondary | ICD-10-CM | POA: Diagnosis not present

## 2020-08-12 DIAGNOSIS — N401 Enlarged prostate with lower urinary tract symptoms: Secondary | ICD-10-CM | POA: Diagnosis not present

## 2020-08-26 DIAGNOSIS — H409 Unspecified glaucoma: Secondary | ICD-10-CM | POA: Diagnosis not present

## 2020-08-26 DIAGNOSIS — E7800 Pure hypercholesterolemia, unspecified: Secondary | ICD-10-CM | POA: Diagnosis not present

## 2020-08-26 DIAGNOSIS — N401 Enlarged prostate with lower urinary tract symptoms: Secondary | ICD-10-CM | POA: Diagnosis not present

## 2020-08-26 DIAGNOSIS — E1122 Type 2 diabetes mellitus with diabetic chronic kidney disease: Secondary | ICD-10-CM | POA: Diagnosis not present

## 2020-08-26 DIAGNOSIS — N182 Chronic kidney disease, stage 2 (mild): Secondary | ICD-10-CM | POA: Diagnosis not present

## 2020-08-26 DIAGNOSIS — I10 Essential (primary) hypertension: Secondary | ICD-10-CM | POA: Diagnosis not present

## 2020-08-29 ENCOUNTER — Encounter: Payer: Self-pay | Admitting: *Deleted

## 2020-08-29 ENCOUNTER — Other Ambulatory Visit: Payer: Self-pay | Admitting: *Deleted

## 2020-08-29 DIAGNOSIS — J8410 Pulmonary fibrosis, unspecified: Secondary | ICD-10-CM

## 2020-08-29 NOTE — Progress Notes (Signed)
ATC 6/10

## 2020-09-01 ENCOUNTER — Telehealth: Payer: Self-pay | Admitting: Pulmonary Disease

## 2020-09-01 NOTE — Telephone Encounter (Signed)
I called and spoke with patient regarding CT results. Patient verbalized understanding and notify PCP about plaque build up in heart. CT has already been ordered. Nothing further needed.

## 2020-09-16 DIAGNOSIS — Z86010 Personal history of colonic polyps: Secondary | ICD-10-CM | POA: Diagnosis not present

## 2020-09-17 DIAGNOSIS — K648 Other hemorrhoids: Secondary | ICD-10-CM | POA: Diagnosis not present

## 2020-09-17 DIAGNOSIS — Z1211 Encounter for screening for malignant neoplasm of colon: Secondary | ICD-10-CM | POA: Diagnosis not present

## 2020-09-17 DIAGNOSIS — K635 Polyp of colon: Secondary | ICD-10-CM | POA: Diagnosis not present

## 2020-09-17 DIAGNOSIS — D123 Benign neoplasm of transverse colon: Secondary | ICD-10-CM | POA: Diagnosis not present

## 2020-09-17 DIAGNOSIS — D128 Benign neoplasm of rectum: Secondary | ICD-10-CM | POA: Diagnosis not present

## 2020-09-23 DIAGNOSIS — K635 Polyp of colon: Secondary | ICD-10-CM | POA: Diagnosis not present

## 2020-09-23 DIAGNOSIS — D123 Benign neoplasm of transverse colon: Secondary | ICD-10-CM | POA: Diagnosis not present

## 2020-10-14 DIAGNOSIS — I10 Essential (primary) hypertension: Secondary | ICD-10-CM | POA: Diagnosis not present

## 2020-10-14 DIAGNOSIS — E1122 Type 2 diabetes mellitus with diabetic chronic kidney disease: Secondary | ICD-10-CM | POA: Diagnosis not present

## 2020-10-14 DIAGNOSIS — Z7984 Long term (current) use of oral hypoglycemic drugs: Secondary | ICD-10-CM | POA: Diagnosis not present

## 2020-10-14 DIAGNOSIS — E113293 Type 2 diabetes mellitus with mild nonproliferative diabetic retinopathy without macular edema, bilateral: Secondary | ICD-10-CM | POA: Diagnosis not present

## 2020-10-16 DIAGNOSIS — H409 Unspecified glaucoma: Secondary | ICD-10-CM | POA: Diagnosis not present

## 2020-10-16 DIAGNOSIS — E7800 Pure hypercholesterolemia, unspecified: Secondary | ICD-10-CM | POA: Diagnosis not present

## 2020-10-16 DIAGNOSIS — N401 Enlarged prostate with lower urinary tract symptoms: Secondary | ICD-10-CM | POA: Diagnosis not present

## 2020-10-16 DIAGNOSIS — N182 Chronic kidney disease, stage 2 (mild): Secondary | ICD-10-CM | POA: Diagnosis not present

## 2020-10-16 DIAGNOSIS — E1122 Type 2 diabetes mellitus with diabetic chronic kidney disease: Secondary | ICD-10-CM | POA: Diagnosis not present

## 2020-10-16 DIAGNOSIS — I10 Essential (primary) hypertension: Secondary | ICD-10-CM | POA: Diagnosis not present

## 2020-10-24 DIAGNOSIS — D225 Melanocytic nevi of trunk: Secondary | ICD-10-CM | POA: Diagnosis not present

## 2020-10-24 DIAGNOSIS — Z08 Encounter for follow-up examination after completed treatment for malignant neoplasm: Secondary | ICD-10-CM | POA: Diagnosis not present

## 2020-10-24 DIAGNOSIS — Z85820 Personal history of malignant melanoma of skin: Secondary | ICD-10-CM | POA: Diagnosis not present

## 2020-10-24 DIAGNOSIS — C4441 Basal cell carcinoma of skin of scalp and neck: Secondary | ICD-10-CM | POA: Diagnosis not present

## 2020-10-24 DIAGNOSIS — Z1283 Encounter for screening for malignant neoplasm of skin: Secondary | ICD-10-CM | POA: Diagnosis not present

## 2020-10-24 DIAGNOSIS — L570 Actinic keratosis: Secondary | ICD-10-CM | POA: Diagnosis not present

## 2020-10-24 DIAGNOSIS — X32XXXD Exposure to sunlight, subsequent encounter: Secondary | ICD-10-CM | POA: Diagnosis not present

## 2020-10-24 DIAGNOSIS — C4372 Malignant melanoma of left lower limb, including hip: Secondary | ICD-10-CM | POA: Diagnosis not present

## 2020-11-06 DIAGNOSIS — D485 Neoplasm of uncertain behavior of skin: Secondary | ICD-10-CM | POA: Diagnosis not present

## 2020-11-10 DIAGNOSIS — N182 Chronic kidney disease, stage 2 (mild): Secondary | ICD-10-CM | POA: Diagnosis not present

## 2020-11-10 DIAGNOSIS — E7800 Pure hypercholesterolemia, unspecified: Secondary | ICD-10-CM | POA: Diagnosis not present

## 2020-11-10 DIAGNOSIS — N401 Enlarged prostate with lower urinary tract symptoms: Secondary | ICD-10-CM | POA: Diagnosis not present

## 2020-11-10 DIAGNOSIS — E1122 Type 2 diabetes mellitus with diabetic chronic kidney disease: Secondary | ICD-10-CM | POA: Diagnosis not present

## 2020-11-10 DIAGNOSIS — H409 Unspecified glaucoma: Secondary | ICD-10-CM | POA: Diagnosis not present

## 2020-11-10 DIAGNOSIS — I10 Essential (primary) hypertension: Secondary | ICD-10-CM | POA: Diagnosis not present

## 2020-11-19 DIAGNOSIS — I10 Essential (primary) hypertension: Secondary | ICD-10-CM | POA: Diagnosis not present

## 2020-11-26 DIAGNOSIS — C434 Malignant melanoma of scalp and neck: Secondary | ICD-10-CM | POA: Diagnosis not present

## 2020-12-04 DIAGNOSIS — E1122 Type 2 diabetes mellitus with diabetic chronic kidney disease: Secondary | ICD-10-CM | POA: Diagnosis not present

## 2020-12-04 DIAGNOSIS — K862 Cyst of pancreas: Secondary | ICD-10-CM | POA: Diagnosis not present

## 2020-12-04 DIAGNOSIS — E113293 Type 2 diabetes mellitus with mild nonproliferative diabetic retinopathy without macular edema, bilateral: Secondary | ICD-10-CM | POA: Diagnosis not present

## 2020-12-04 DIAGNOSIS — N182 Chronic kidney disease, stage 2 (mild): Secondary | ICD-10-CM | POA: Diagnosis not present

## 2020-12-04 DIAGNOSIS — I10 Essential (primary) hypertension: Secondary | ICD-10-CM | POA: Diagnosis not present

## 2020-12-04 DIAGNOSIS — M353 Polymyalgia rheumatica: Secondary | ICD-10-CM | POA: Diagnosis not present

## 2020-12-05 DIAGNOSIS — N401 Enlarged prostate with lower urinary tract symptoms: Secondary | ICD-10-CM | POA: Diagnosis not present

## 2020-12-05 DIAGNOSIS — H409 Unspecified glaucoma: Secondary | ICD-10-CM | POA: Diagnosis not present

## 2020-12-05 DIAGNOSIS — E1122 Type 2 diabetes mellitus with diabetic chronic kidney disease: Secondary | ICD-10-CM | POA: Diagnosis not present

## 2020-12-05 DIAGNOSIS — N182 Chronic kidney disease, stage 2 (mild): Secondary | ICD-10-CM | POA: Diagnosis not present

## 2020-12-05 DIAGNOSIS — E7800 Pure hypercholesterolemia, unspecified: Secondary | ICD-10-CM | POA: Diagnosis not present

## 2020-12-05 DIAGNOSIS — I10 Essential (primary) hypertension: Secondary | ICD-10-CM | POA: Diagnosis not present

## 2020-12-29 DIAGNOSIS — E119 Type 2 diabetes mellitus without complications: Secondary | ICD-10-CM | POA: Diagnosis not present

## 2020-12-29 DIAGNOSIS — C434 Malignant melanoma of scalp and neck: Secondary | ICD-10-CM | POA: Diagnosis not present

## 2021-01-05 DIAGNOSIS — C434 Malignant melanoma of scalp and neck: Secondary | ICD-10-CM | POA: Diagnosis not present

## 2021-01-05 DIAGNOSIS — Z09 Encounter for follow-up examination after completed treatment for conditions other than malignant neoplasm: Secondary | ICD-10-CM | POA: Diagnosis not present

## 2021-01-12 DIAGNOSIS — Z09 Encounter for follow-up examination after completed treatment for conditions other than malignant neoplasm: Secondary | ICD-10-CM | POA: Diagnosis not present

## 2021-01-12 DIAGNOSIS — C434 Malignant melanoma of scalp and neck: Secondary | ICD-10-CM | POA: Diagnosis not present

## 2021-01-14 DIAGNOSIS — Z7984 Long term (current) use of oral hypoglycemic drugs: Secondary | ICD-10-CM | POA: Diagnosis not present

## 2021-01-14 DIAGNOSIS — E113293 Type 2 diabetes mellitus with mild nonproliferative diabetic retinopathy without macular edema, bilateral: Secondary | ICD-10-CM | POA: Diagnosis not present

## 2021-01-19 DIAGNOSIS — E1122 Type 2 diabetes mellitus with diabetic chronic kidney disease: Secondary | ICD-10-CM | POA: Diagnosis not present

## 2021-01-19 DIAGNOSIS — H409 Unspecified glaucoma: Secondary | ICD-10-CM | POA: Diagnosis not present

## 2021-01-19 DIAGNOSIS — N182 Chronic kidney disease, stage 2 (mild): Secondary | ICD-10-CM | POA: Diagnosis not present

## 2021-01-19 DIAGNOSIS — E7800 Pure hypercholesterolemia, unspecified: Secondary | ICD-10-CM | POA: Diagnosis not present

## 2021-01-19 DIAGNOSIS — I10 Essential (primary) hypertension: Secondary | ICD-10-CM | POA: Diagnosis not present

## 2021-01-19 DIAGNOSIS — N401 Enlarged prostate with lower urinary tract symptoms: Secondary | ICD-10-CM | POA: Diagnosis not present

## 2021-01-21 DIAGNOSIS — C434 Malignant melanoma of scalp and neck: Secondary | ICD-10-CM | POA: Diagnosis not present

## 2021-01-21 DIAGNOSIS — Z09 Encounter for follow-up examination after completed treatment for conditions other than malignant neoplasm: Secondary | ICD-10-CM | POA: Diagnosis not present

## 2021-01-27 DIAGNOSIS — H40053 Ocular hypertension, bilateral: Secondary | ICD-10-CM | POA: Diagnosis not present

## 2021-01-27 DIAGNOSIS — E119 Type 2 diabetes mellitus without complications: Secondary | ICD-10-CM | POA: Diagnosis not present

## 2021-01-27 DIAGNOSIS — H35033 Hypertensive retinopathy, bilateral: Secondary | ICD-10-CM | POA: Diagnosis not present

## 2021-01-27 DIAGNOSIS — H40013 Open angle with borderline findings, low risk, bilateral: Secondary | ICD-10-CM | POA: Diagnosis not present

## 2021-02-04 DIAGNOSIS — C434 Malignant melanoma of scalp and neck: Secondary | ICD-10-CM | POA: Diagnosis not present

## 2021-02-04 DIAGNOSIS — Z09 Encounter for follow-up examination after completed treatment for conditions other than malignant neoplasm: Secondary | ICD-10-CM | POA: Diagnosis not present

## 2021-02-09 DIAGNOSIS — C4449 Other specified malignant neoplasm of skin of scalp and neck: Secondary | ICD-10-CM | POA: Diagnosis not present

## 2021-02-09 DIAGNOSIS — C434 Malignant melanoma of scalp and neck: Secondary | ICD-10-CM | POA: Diagnosis not present

## 2021-02-09 DIAGNOSIS — Z8616 Personal history of COVID-19: Secondary | ICD-10-CM | POA: Diagnosis not present

## 2021-02-09 DIAGNOSIS — M353 Polymyalgia rheumatica: Secondary | ICD-10-CM | POA: Diagnosis not present

## 2021-02-09 DIAGNOSIS — C4441 Basal cell carcinoma of skin of scalp and neck: Secondary | ICD-10-CM | POA: Diagnosis not present

## 2021-02-09 DIAGNOSIS — C4442 Squamous cell carcinoma of skin of scalp and neck: Secondary | ICD-10-CM | POA: Diagnosis not present

## 2021-02-09 DIAGNOSIS — E119 Type 2 diabetes mellitus without complications: Secondary | ICD-10-CM | POA: Diagnosis not present

## 2021-02-09 DIAGNOSIS — Z7952 Long term (current) use of systemic steroids: Secondary | ICD-10-CM | POA: Diagnosis not present

## 2021-02-17 DIAGNOSIS — C4442 Squamous cell carcinoma of skin of scalp and neck: Secondary | ICD-10-CM | POA: Diagnosis not present

## 2021-02-17 DIAGNOSIS — C434 Malignant melanoma of scalp and neck: Secondary | ICD-10-CM | POA: Diagnosis not present

## 2021-02-17 DIAGNOSIS — Z09 Encounter for follow-up examination after completed treatment for conditions other than malignant neoplasm: Secondary | ICD-10-CM | POA: Diagnosis not present

## 2021-03-04 DIAGNOSIS — C4442 Squamous cell carcinoma of skin of scalp and neck: Secondary | ICD-10-CM | POA: Diagnosis not present

## 2021-03-04 DIAGNOSIS — Z09 Encounter for follow-up examination after completed treatment for conditions other than malignant neoplasm: Secondary | ICD-10-CM | POA: Diagnosis not present

## 2021-03-20 DIAGNOSIS — I10 Essential (primary) hypertension: Secondary | ICD-10-CM | POA: Diagnosis not present

## 2021-04-15 DIAGNOSIS — E1122 Type 2 diabetes mellitus with diabetic chronic kidney disease: Secondary | ICD-10-CM | POA: Diagnosis not present

## 2021-04-15 DIAGNOSIS — N182 Chronic kidney disease, stage 2 (mild): Secondary | ICD-10-CM | POA: Diagnosis not present

## 2021-04-15 DIAGNOSIS — I10 Essential (primary) hypertension: Secondary | ICD-10-CM | POA: Diagnosis not present

## 2021-04-15 DIAGNOSIS — N401 Enlarged prostate with lower urinary tract symptoms: Secondary | ICD-10-CM | POA: Diagnosis not present

## 2021-04-15 DIAGNOSIS — E7800 Pure hypercholesterolemia, unspecified: Secondary | ICD-10-CM | POA: Diagnosis not present

## 2021-04-15 DIAGNOSIS — R69 Illness, unspecified: Secondary | ICD-10-CM | POA: Diagnosis not present

## 2021-04-17 DIAGNOSIS — Z1389 Encounter for screening for other disorder: Secondary | ICD-10-CM | POA: Diagnosis not present

## 2021-04-17 DIAGNOSIS — I10 Essential (primary) hypertension: Secondary | ICD-10-CM | POA: Diagnosis not present

## 2021-04-17 DIAGNOSIS — M353 Polymyalgia rheumatica: Secondary | ICD-10-CM | POA: Diagnosis not present

## 2021-04-17 DIAGNOSIS — N182 Chronic kidney disease, stage 2 (mild): Secondary | ICD-10-CM | POA: Diagnosis not present

## 2021-04-17 DIAGNOSIS — R69 Illness, unspecified: Secondary | ICD-10-CM | POA: Diagnosis not present

## 2021-04-17 DIAGNOSIS — I700 Atherosclerosis of aorta: Secondary | ICD-10-CM | POA: Diagnosis not present

## 2021-04-17 DIAGNOSIS — E113293 Type 2 diabetes mellitus with mild nonproliferative diabetic retinopathy without macular edema, bilateral: Secondary | ICD-10-CM | POA: Diagnosis not present

## 2021-04-17 DIAGNOSIS — E1122 Type 2 diabetes mellitus with diabetic chronic kidney disease: Secondary | ICD-10-CM | POA: Diagnosis not present

## 2021-04-17 DIAGNOSIS — Z0000 Encounter for general adult medical examination without abnormal findings: Secondary | ICD-10-CM | POA: Diagnosis not present

## 2021-04-17 DIAGNOSIS — R5383 Other fatigue: Secondary | ICD-10-CM | POA: Diagnosis not present

## 2021-04-17 DIAGNOSIS — Z5181 Encounter for therapeutic drug level monitoring: Secondary | ICD-10-CM | POA: Diagnosis not present

## 2021-04-17 DIAGNOSIS — Z79899 Other long term (current) drug therapy: Secondary | ICD-10-CM | POA: Diagnosis not present

## 2021-05-08 IMAGING — CT CT CHEST W/ CM
1 of 2 series · 14 of 30 positions shown, 18 images · IV contrast (OMNIPAQUE)
Comparison: Chest CT 04/09/2019

CLINICAL DATA: Acute hypoxic respiratory failure due to COVID
pneumonia. Febrile illness.

EXAM:
CT CHEST, ABDOMEN, AND PELVIS WITH CONTRAST
TECHNIQUE: Multidetector CT imaging of the chest, abdomen and pelvis was
performed following the standard protocol during bolus
administration of intravenous contrast.
CONTRAST:  100mL OMNIPAQUE IOHEXOL 350 MG/ML SOLN

[Series 7: (person_name) thin · axial · 0.71mm/px · z∈[-858,-189]mm · 14 of 1043 slices shown, 18 images]
[im 44/1043  mediastinal]
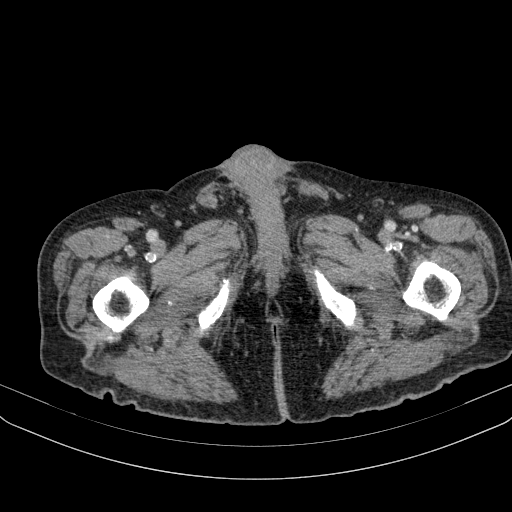
[im 44/1043  lung]
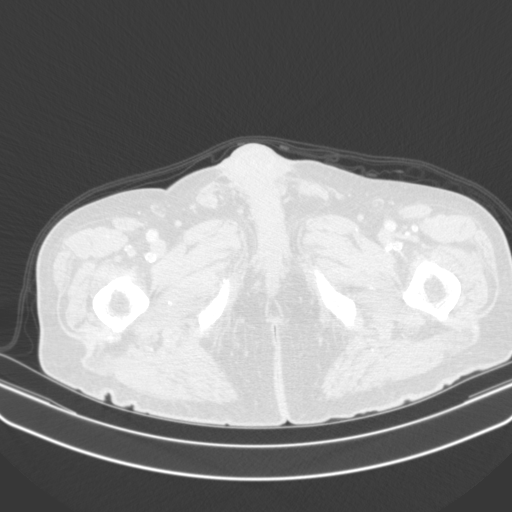
[im 131/1043  lung]
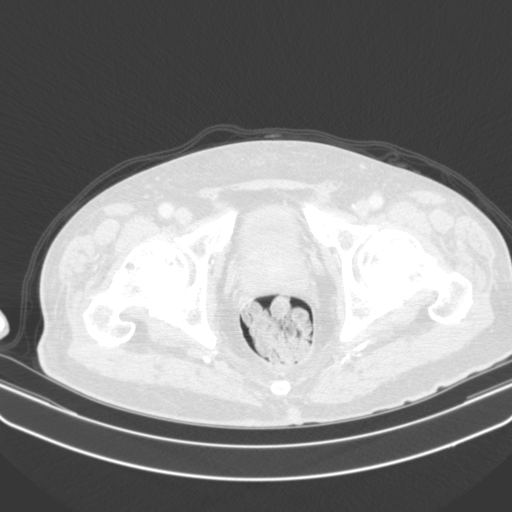
[im 218/1043  lung]
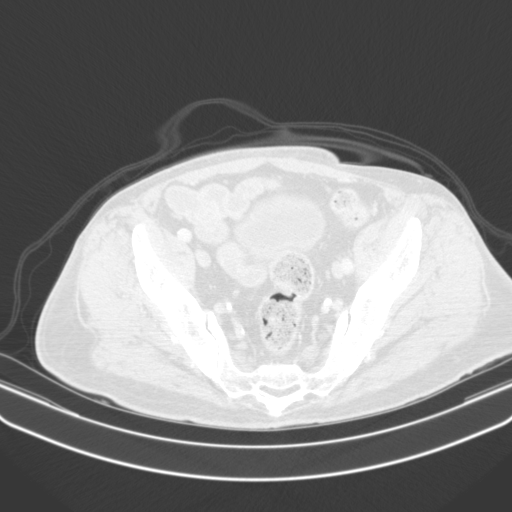
[im 304/1043  lung]
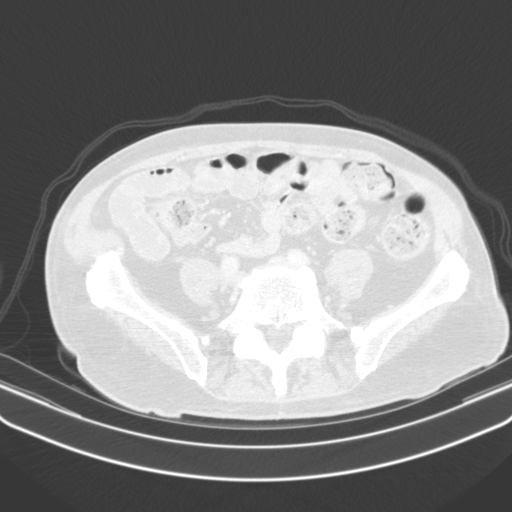
[im 348/1043  mediastinal]
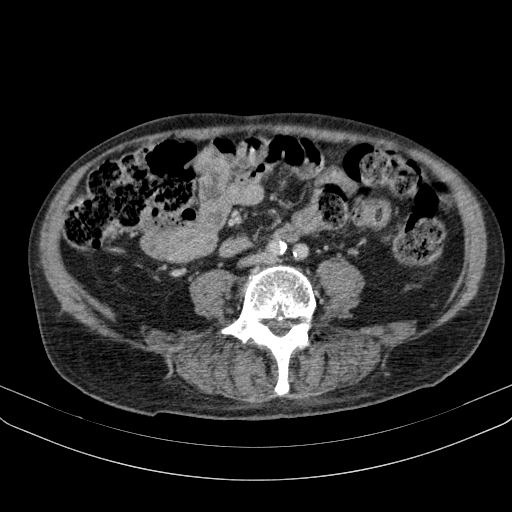
[im 348/1043  lung]
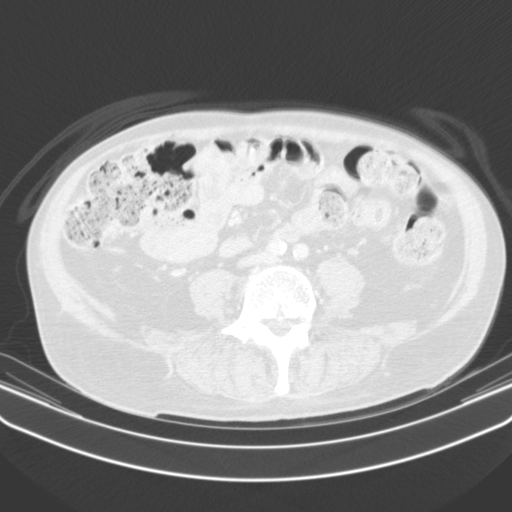
[im 435/1043  lung]
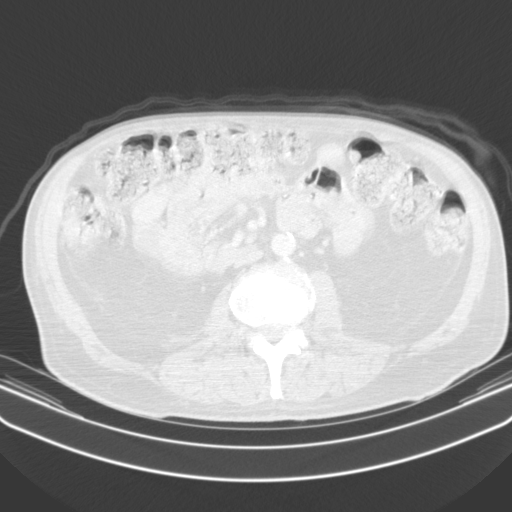
[im 507/1043  lung]
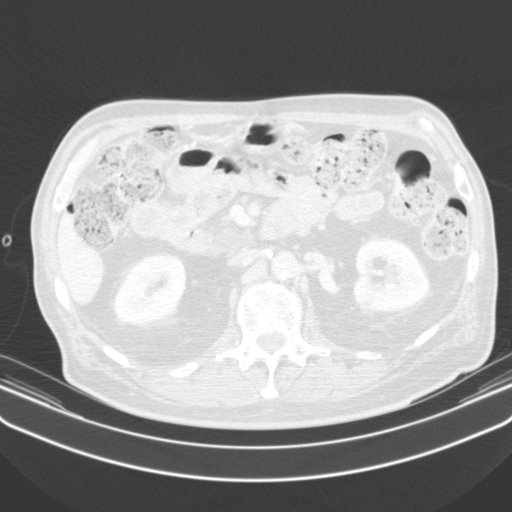
[im 522/1043  lung]
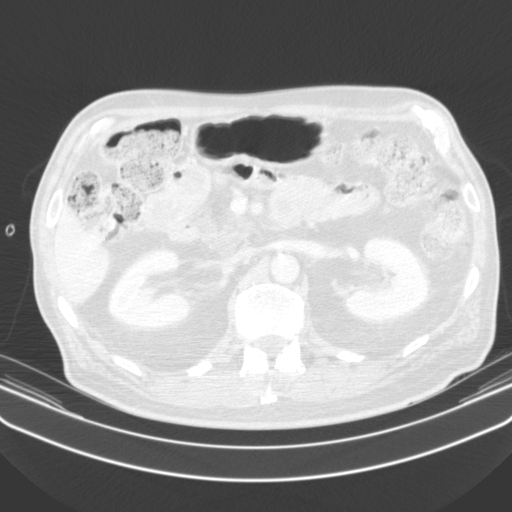
[im 608/1043  mediastinal]
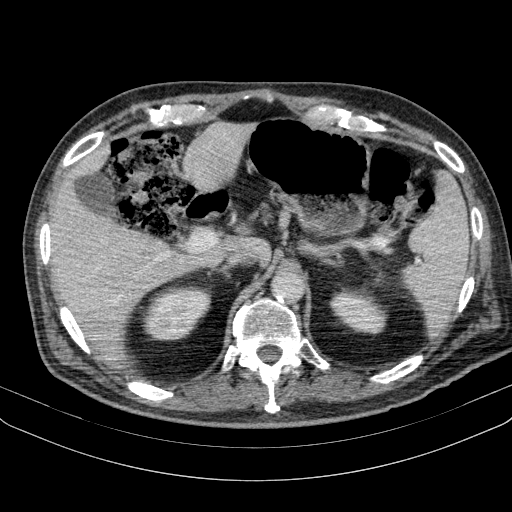
[im 608/1043  lung]
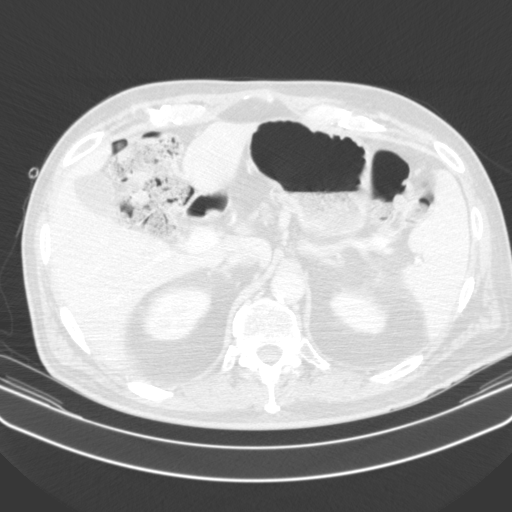
[im 695/1043  lung]
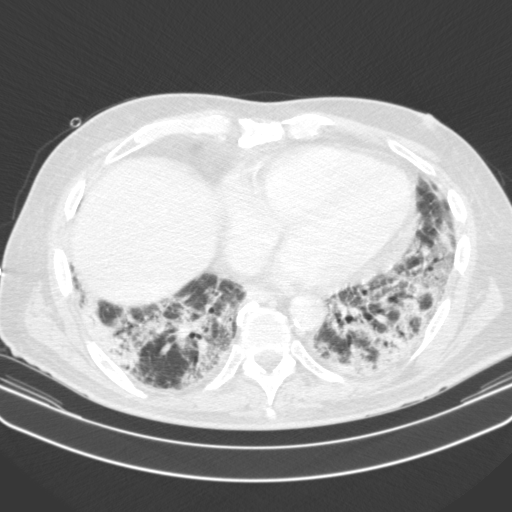
[im 782/1043  lung]
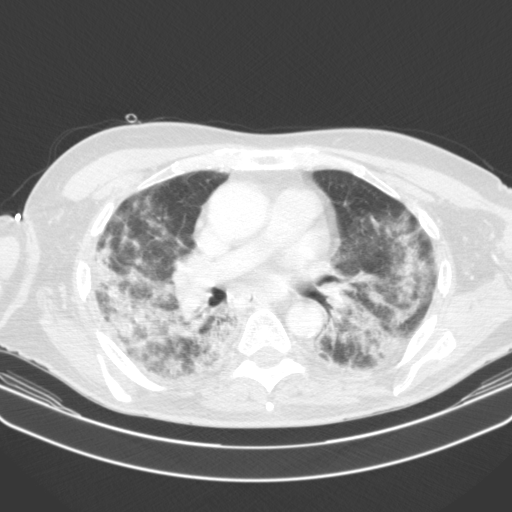
[im 825/1043  lung]
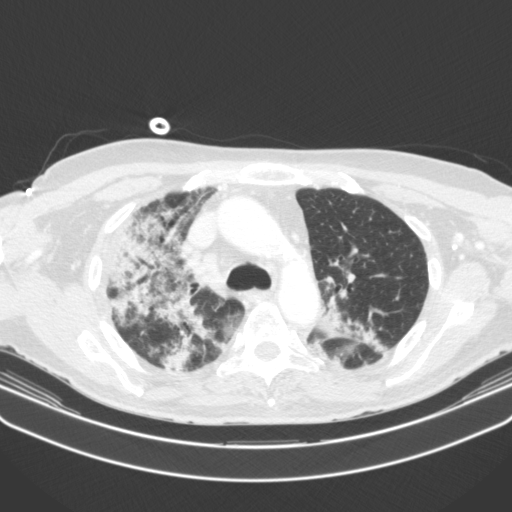
[im 912/1043  mediastinal]
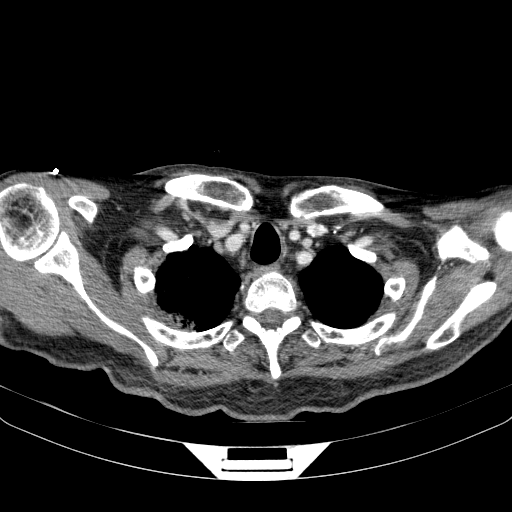
[im 912/1043  lung]
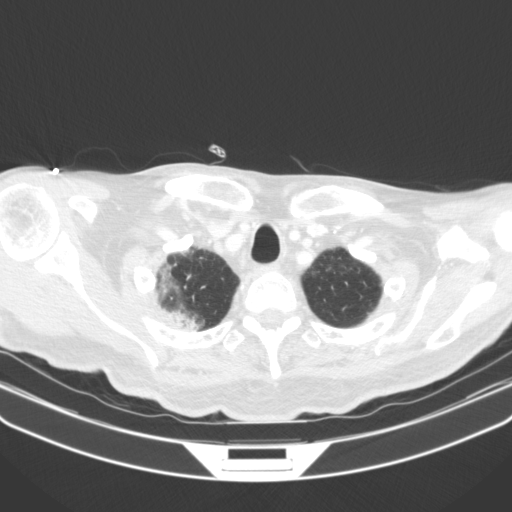
[im 999/1043  lung]
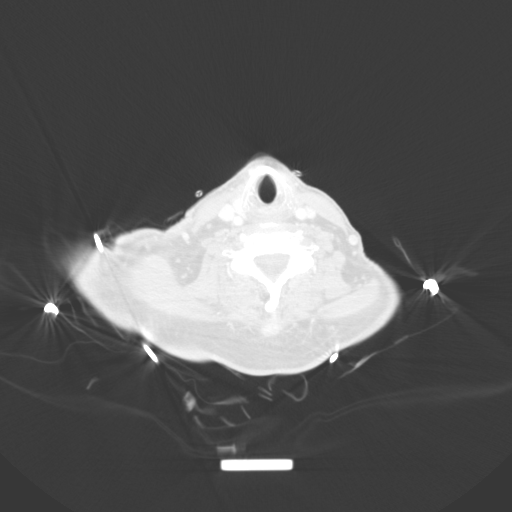

[14 of 30 positions shown; findings below may reference images not displayed]

FINDINGS: CT CHEST FINDINGS

Cardiovascular: The heart is mildly enlarged but stable. Stable
tortuosity, ectasia and calcification of the thoracic aorta. Stable
scattered coronary artery calcifications.

Mediastinum/Nodes: Persistent scattered mediastinal and hilar lymph
nodes. The esophagus is grossly normal.

Lungs/Pleura: Exam limited by breathing motion artifact but there is
persistent diffuse interstitial and airspace disease in both lungs.
The infiltrates demonstrate progressive consolidation versus the
more ground-glass appearance on the prior study. No definite pleural
effusions or pleural lesions. No pneumothorax.

Musculoskeletal: No significant findings.

CT ABDOMEN PELVIS FINDINGS

Examination is limited due to breathing motion artifact.

Hepatobiliary: No focal hepatic lesions or intrahepatic biliary
dilatation. The gallbladder is normal. No common bile duct
dilatation.

Pancreas: Small cystic area noted in the pancreatic head which is
likely a benign postinflammatory cyst. There is a second exophytic
cyst projecting off the body tail junction region. No findings for
acute pancreatitis and no ductal dilatation.

Spleen: Normal size. No focal lesions.

Adrenals/Urinary Tract: The adrenal glands and kidneys are grossly
normal. Small left renal cyst noted. No hydronephrosis. The bladder
is unremarkable.

Stomach/Bowel: The stomach, duodenum, small bowel and colon are
grossly normal. No acute inflammatory changes, mass lesions or
obstructive findings. Moderate stool throughout the colon and down
into the rectum suggesting constipation

Vascular/Lymphatic: Moderate atherosclerotic calcifications
involving the aorta and branch vessels but no aneurysm. No
mesenteric or retroperitoneal mass or adenopathy.

Reproductive: Mild prostate gland enlargement. The seminal vesicles
appear normal.

Other: No pelvic mass or free pelvic fluid collections. No inguinal
mass or adenopathy.

Musculoskeletal: No significant bony findings.
IMPRESSION: 1. Persistent diffuse interstitial and airspace disease in both
lungs. The infiltrates demonstrate progressive consolidation versus
the more ground-glass appearance on the prior study.
2. Stable mediastinal and hilar lymph nodes.
3. No acute abdominal/pelvic findings, mass lesions or adenopathy.
4. Cystic lesions in the pancreas, likely benign postinflammatory
cysts but will need follow-up once the patient is no longer acutely
ill. MRI of the abdomen would be helpful for further evaluation if
possible at some point.
5. Moderate stool throughout the colon and down into the rectum
suggesting constipation.

## 2021-05-12 DIAGNOSIS — Z7189 Other specified counseling: Secondary | ICD-10-CM | POA: Diagnosis not present

## 2021-05-12 DIAGNOSIS — I10 Essential (primary) hypertension: Secondary | ICD-10-CM | POA: Diagnosis not present

## 2021-05-12 DIAGNOSIS — E7800 Pure hypercholesterolemia, unspecified: Secondary | ICD-10-CM | POA: Diagnosis not present

## 2021-05-12 DIAGNOSIS — R0981 Nasal congestion: Secondary | ICD-10-CM | POA: Diagnosis not present

## 2021-05-12 DIAGNOSIS — E1122 Type 2 diabetes mellitus with diabetic chronic kidney disease: Secondary | ICD-10-CM | POA: Diagnosis not present

## 2021-05-12 DIAGNOSIS — E113293 Type 2 diabetes mellitus with mild nonproliferative diabetic retinopathy without macular edema, bilateral: Secondary | ICD-10-CM | POA: Diagnosis not present

## 2021-05-28 DIAGNOSIS — Z85820 Personal history of malignant melanoma of skin: Secondary | ICD-10-CM | POA: Diagnosis not present

## 2021-05-28 DIAGNOSIS — Z08 Encounter for follow-up examination after completed treatment for malignant neoplasm: Secondary | ICD-10-CM | POA: Diagnosis not present

## 2021-05-28 DIAGNOSIS — L905 Scar conditions and fibrosis of skin: Secondary | ICD-10-CM | POA: Diagnosis not present

## 2021-05-28 DIAGNOSIS — L720 Epidermal cyst: Secondary | ICD-10-CM | POA: Diagnosis not present

## 2021-05-28 DIAGNOSIS — L821 Other seborrheic keratosis: Secondary | ICD-10-CM | POA: Diagnosis not present

## 2021-05-28 DIAGNOSIS — Z1283 Encounter for screening for malignant neoplasm of skin: Secondary | ICD-10-CM | POA: Diagnosis not present

## 2021-06-10 DIAGNOSIS — N182 Chronic kidney disease, stage 2 (mild): Secondary | ICD-10-CM | POA: Diagnosis not present

## 2021-06-10 DIAGNOSIS — R69 Illness, unspecified: Secondary | ICD-10-CM | POA: Diagnosis not present

## 2021-06-10 DIAGNOSIS — N401 Enlarged prostate with lower urinary tract symptoms: Secondary | ICD-10-CM | POA: Diagnosis not present

## 2021-06-10 DIAGNOSIS — I10 Essential (primary) hypertension: Secondary | ICD-10-CM | POA: Diagnosis not present

## 2021-06-10 DIAGNOSIS — E7800 Pure hypercholesterolemia, unspecified: Secondary | ICD-10-CM | POA: Diagnosis not present

## 2021-06-10 DIAGNOSIS — E1122 Type 2 diabetes mellitus with diabetic chronic kidney disease: Secondary | ICD-10-CM | POA: Diagnosis not present

## 2021-07-28 ENCOUNTER — Other Ambulatory Visit: Payer: Self-pay | Admitting: Internal Medicine

## 2021-07-28 ENCOUNTER — Ambulatory Visit
Admission: RE | Admit: 2021-07-28 | Discharge: 2021-07-28 | Disposition: A | Discharge status: Home / Self Care | Payer: Medicare HMO | Source: Ambulatory Visit | Attending: Internal Medicine | Admitting: Internal Medicine

## 2021-07-28 DIAGNOSIS — R0781 Pleurodynia: Secondary | ICD-10-CM

## 2021-07-28 DIAGNOSIS — E113293 Type 2 diabetes mellitus with mild nonproliferative diabetic retinopathy without macular edema, bilateral: Secondary | ICD-10-CM | POA: Diagnosis not present

## 2021-07-28 DIAGNOSIS — H35033 Hypertensive retinopathy, bilateral: Secondary | ICD-10-CM | POA: Diagnosis not present

## 2021-07-28 DIAGNOSIS — H353132 Nonexudative age-related macular degeneration, bilateral, intermediate dry stage: Secondary | ICD-10-CM | POA: Diagnosis not present

## 2021-07-28 DIAGNOSIS — H40013 Open angle with borderline findings, low risk, bilateral: Secondary | ICD-10-CM | POA: Diagnosis not present

## 2021-07-28 DIAGNOSIS — E119 Type 2 diabetes mellitus without complications: Secondary | ICD-10-CM | POA: Diagnosis not present

## 2021-07-28 DIAGNOSIS — H26493 Other secondary cataract, bilateral: Secondary | ICD-10-CM | POA: Diagnosis not present

## 2021-07-28 DIAGNOSIS — R079 Chest pain, unspecified: Secondary | ICD-10-CM | POA: Diagnosis not present

## 2021-07-28 DIAGNOSIS — H40053 Ocular hypertension, bilateral: Secondary | ICD-10-CM | POA: Diagnosis not present

## 2021-08-03 DIAGNOSIS — Z8616 Personal history of COVID-19: Secondary | ICD-10-CM | POA: Diagnosis not present

## 2021-08-03 DIAGNOSIS — R0981 Nasal congestion: Secondary | ICD-10-CM | POA: Diagnosis not present

## 2021-08-03 DIAGNOSIS — Z9089 Acquired absence of other organs: Secondary | ICD-10-CM | POA: Diagnosis not present

## 2021-08-03 DIAGNOSIS — Z87891 Personal history of nicotine dependence: Secondary | ICD-10-CM | POA: Diagnosis not present

## 2021-08-03 DIAGNOSIS — J3489 Other specified disorders of nose and nasal sinuses: Secondary | ICD-10-CM | POA: Diagnosis not present

## 2021-08-24 DIAGNOSIS — E538 Deficiency of other specified B group vitamins: Secondary | ICD-10-CM | POA: Diagnosis not present

## 2021-08-24 DIAGNOSIS — E113293 Type 2 diabetes mellitus with mild nonproliferative diabetic retinopathy without macular edema, bilateral: Secondary | ICD-10-CM | POA: Diagnosis not present

## 2021-08-24 DIAGNOSIS — I10 Essential (primary) hypertension: Secondary | ICD-10-CM | POA: Diagnosis not present

## 2021-08-24 DIAGNOSIS — M353 Polymyalgia rheumatica: Secondary | ICD-10-CM | POA: Diagnosis not present

## 2021-08-28 ENCOUNTER — Ambulatory Visit (INDEPENDENT_AMBULATORY_CARE_PROVIDER_SITE_OTHER)
Admission: RE | Admit: 2021-08-28 | Discharge: 2021-08-28 | Disposition: A | Discharge status: Home / Self Care | Payer: Medicare HMO | Source: Ambulatory Visit | Attending: Pulmonary Disease | Admitting: Pulmonary Disease

## 2021-08-28 DIAGNOSIS — J8410 Pulmonary fibrosis, unspecified: Secondary | ICD-10-CM

## 2021-08-28 DIAGNOSIS — I700 Atherosclerosis of aorta: Secondary | ICD-10-CM | POA: Diagnosis not present

## 2021-08-28 DIAGNOSIS — J479 Bronchiectasis, uncomplicated: Secondary | ICD-10-CM | POA: Diagnosis not present

## 2021-08-28 DIAGNOSIS — J849 Interstitial pulmonary disease, unspecified: Secondary | ICD-10-CM | POA: Diagnosis not present

## 2021-08-28 DIAGNOSIS — I712 Thoracic aortic aneurysm, without rupture, unspecified: Secondary | ICD-10-CM | POA: Diagnosis not present

## 2021-09-07 DIAGNOSIS — C4442 Squamous cell carcinoma of skin of scalp and neck: Secondary | ICD-10-CM | POA: Diagnosis not present

## 2021-10-20 ENCOUNTER — Ambulatory Visit: Payer: Medicare HMO | Admitting: Pulmonary Disease

## 2021-10-20 ENCOUNTER — Other Ambulatory Visit: Payer: Self-pay | Admitting: Pulmonary Disease

## 2021-10-20 ENCOUNTER — Encounter: Payer: Self-pay | Admitting: Pulmonary Disease

## 2021-10-20 VITALS — BP 120/60 | HR 67 | Temp 97.6°F | Ht 70.0 in | Wt 153.8 lb

## 2021-10-20 DIAGNOSIS — J8410 Pulmonary fibrosis, unspecified: Secondary | ICD-10-CM

## 2021-10-20 DIAGNOSIS — Z7689 Persons encountering health services in other specified circumstances: Secondary | ICD-10-CM | POA: Diagnosis not present

## 2021-10-20 NOTE — Progress Notes (Signed)
Dustin Diaz    [id number]    28-Sep-1945  Primary Care Physician:Griffin, Jenny Reichmann, MD  Referring Physician: Lavone Orn, MD [facility] [employer] [facility] [address] [place],  [place] [address]  Chief complaint: Follow-up for post COVID-6  HPI: 76 year old with polymyalgia rheumatica, diabetes, hyperlipidemia, hypertension, melanoma Hospitalized with COVID-19 from 1/14-2/11/21, where he was treated with remdesivir, steroids, and 2 doses of Tocilizumab.  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  He complains of dyspnea on exertion, no cough, fevers, wheezing. Reports that over the past few weeks he starting to feel better.  Has gone back to fishing and recently completed a fishing trip out of state.  Pets: Dogs Occupation: Retired Engineer, maintenance (IT) Exposures: No known exposures.  No mold, hot tub, Jacuzzi.  No feather pillows or comforters Smoking history: Quit smoking in the 1970s Travel history: Previously lived in [place], [place], [place] Relevant family history: No significant family issue of lung disease  Interim history: Here for review of CT scan.  States that breathing is stable with no change Still loves to go fishing  Outpatient Encounter Medications as of 10/20/2021  Medication Sig   atorvastatin (LIPITOR) 10 MG tablet Take 10 mg by mouth daily.   fexofenadine (ALLEGRA) 180 MG tablet Take by mouth daily as needed.    fluticasone (FLONASE) 50 MCG/ACT nasal spray Place 2 sprays into the nose daily as needed for allergies.    glimepiride (AMARYL) 1 MG tablet Take 1 mg by mouth daily with breakfast.   JARDIANCE 25 MG TABS tablet Take 25 mg by mouth every morning.    latanoprost (XALATAN) 0.005 % ophthalmic solution Place 1 drop into both eyes at bedtime.   metFORMIN (GLUCOPHAGE-XR) 500 MG 24 hr tablet Take 2,000 mg by mouth every morning.   predniSONE (DELTASONE) 1 MG tablet Take 3 mg by mouth daily.    valACYclovir (VALTREX) 1000 MG tablet Take by mouth.   [DISCONTINUED] glimepiride (AMARYL) 2 MG tablet Take 2 mg by mouth every morning. 2 tablets in am   No facility-administered encounter medications on file as of 10/20/2021.    Physical Exam: Blood pressure 120/60, pulse 67, temperature 97.6 F (36.4 C), temperature source Oral, height '5\' 10"'$  (1.778 m), weight 153 lb 12.8 oz (69.8 kg), SpO2 97 %. Gen:      No acute distress HEENT:  EOMI, sclera anicteric Neck:     No masses; no thyromegaly Lungs:    Clear to auscultation bilaterally; normal respiratory effort CV:         Regular rate and rhythm; no murmurs Abd:      + bowel sounds; soft, non-tender; no palpable masses, no distension Ext:    No edema; adequate peripheral perfusion Skin:      Warm and dry; no rash Neuro: alert and oriented x 3 Psych: normal mood and affect   Data Reviewed: Imaging: CTA 04/09/2019-no PE, severe airspace disease CT chest 04/20/19-persistent bilateral diffuse airspace disease, cystic lesion in the pancreas CT high-resolution 07/04/2019-improvement in groundglass opacities, bilateral fibrotic changes and probable UIP pattern CT high-resolution 01/11/2020-minimal decrease in pulmonary fibrosis CT high-resolution 08/11/2020-indeterminate for UIP CT high-resolution 08/28/2021-mild progression  in probable UIP pattern I have reviewed images personally.  PFTs: 01/30/20 FVC 2.37 [4 5%], FEV1 2.17 [69%], F/F 91, TLC 3.97 [56%], DLCO 17.29 [69%] Moderate restriction with mild diffusion defect.  Overall improvement in lung volumes and diffusion capacity compared to April 2021  Labs:  Assessment:  Post Covid pulmonary fibrosis He has stable clinical symptoms. Last CT is read  as progression, now in probable UIP pattern but I am not convinced that it is actually worse.  In fact the ILD is overall improved compared to 2021.  We will continue to monitor closely.  Get PFTs in 6 months and follow-up in clinic.  Repeat high-res CT in 1 year Check basic CTD serologies.  Plan/Recommendations: PFTs in 6 months  Marshell Garfinkel MD [facility] 10/20/2021, 11:36 AM  CC: Lavone Orn, MD

## 2021-10-20 NOTE — Patient Instructions (Signed)
We will get PFTs for reassessment of lung function in 6 months Follow-up in clinic in 6 months after these tests.

## 2021-10-23 LAB — ANTI-DNA ANTIBODY, DOUBLE-STRANDED: dsDNA Ab: 3 IU/mL (ref 0–9)

## 2021-10-23 LAB — CYCLIC CITRUL PEPTIDE ANTIBODY, IGG/IGA: Cyclic Citrullin Peptide Ab: 4 units (ref 0–19)

## 2021-10-23 LAB — ANA: Anti Nuclear Antibody (ANA): NEGATIVE

## 2021-10-23 LAB — RHEUMATOID FACTOR: Rheumatoid fact SerPl-aCnc: <10 IU/mL (ref <14.0)

## 2021-10-23 LAB — SPECIMEN STATUS REPORT

## 2021-12-01 DIAGNOSIS — Z0100 Encounter for examination of eyes and vision without abnormal findings: Secondary | ICD-10-CM | POA: Diagnosis not present

## 2021-12-03 DIAGNOSIS — Z85820 Personal history of malignant melanoma of skin: Secondary | ICD-10-CM | POA: Diagnosis not present

## 2021-12-03 DIAGNOSIS — Z1283 Encounter for screening for malignant neoplasm of skin: Secondary | ICD-10-CM | POA: Diagnosis not present

## 2021-12-03 DIAGNOSIS — C44329 Squamous cell carcinoma of skin of other parts of face: Secondary | ICD-10-CM | POA: Diagnosis not present

## 2021-12-03 DIAGNOSIS — Z08 Encounter for follow-up examination after completed treatment for malignant neoplasm: Secondary | ICD-10-CM | POA: Diagnosis not present

## 2021-12-03 DIAGNOSIS — D225 Melanocytic nevi of trunk: Secondary | ICD-10-CM | POA: Diagnosis not present

## 2021-12-21 DIAGNOSIS — E1122 Type 2 diabetes mellitus with diabetic chronic kidney disease: Secondary | ICD-10-CM | POA: Diagnosis not present

## 2021-12-21 DIAGNOSIS — D126 Benign neoplasm of colon, unspecified: Secondary | ICD-10-CM | POA: Diagnosis not present

## 2021-12-21 DIAGNOSIS — Z23 Encounter for immunization: Secondary | ICD-10-CM | POA: Diagnosis not present

## 2021-12-21 DIAGNOSIS — J8410 Pulmonary fibrosis, unspecified: Secondary | ICD-10-CM | POA: Diagnosis not present

## 2021-12-21 DIAGNOSIS — I10 Essential (primary) hypertension: Secondary | ICD-10-CM | POA: Diagnosis not present

## 2022-01-07 DIAGNOSIS — Z08 Encounter for follow-up examination after completed treatment for malignant neoplasm: Secondary | ICD-10-CM | POA: Diagnosis not present

## 2022-01-07 DIAGNOSIS — X32XXXD Exposure to sunlight, subsequent encounter: Secondary | ICD-10-CM | POA: Diagnosis not present

## 2022-01-07 DIAGNOSIS — C4441 Basal cell carcinoma of skin of scalp and neck: Secondary | ICD-10-CM | POA: Diagnosis not present

## 2022-01-07 DIAGNOSIS — L570 Actinic keratosis: Secondary | ICD-10-CM | POA: Diagnosis not present

## 2022-01-07 DIAGNOSIS — Z85828 Personal history of other malignant neoplasm of skin: Secondary | ICD-10-CM | POA: Diagnosis not present

## 2022-02-04 DIAGNOSIS — C4441 Basal cell carcinoma of skin of scalp and neck: Secondary | ICD-10-CM | POA: Diagnosis not present

## 2022-02-05 DIAGNOSIS — E113293 Type 2 diabetes mellitus with mild nonproliferative diabetic retinopathy without macular edema, bilateral: Secondary | ICD-10-CM | POA: Diagnosis not present

## 2022-02-05 DIAGNOSIS — H26493 Other secondary cataract, bilateral: Secondary | ICD-10-CM | POA: Diagnosis not present

## 2022-02-05 DIAGNOSIS — H35033 Hypertensive retinopathy, bilateral: Secondary | ICD-10-CM | POA: Diagnosis not present

## 2022-02-05 DIAGNOSIS — H02831 Dermatochalasis of right upper eyelid: Secondary | ICD-10-CM | POA: Diagnosis not present

## 2022-02-05 DIAGNOSIS — Z961 Presence of intraocular lens: Secondary | ICD-10-CM | POA: Diagnosis not present

## 2022-03-03 DIAGNOSIS — C4442 Squamous cell carcinoma of skin of scalp and neck: Secondary | ICD-10-CM | POA: Diagnosis not present

## 2022-04-30 ENCOUNTER — Ambulatory Visit: Payer: Medicare HMO | Admitting: Pulmonary Disease

## 2022-04-30 ENCOUNTER — Ambulatory Visit (INDEPENDENT_AMBULATORY_CARE_PROVIDER_SITE_OTHER): Payer: Medicare HMO | Admitting: Pulmonary Disease

## 2022-04-30 ENCOUNTER — Encounter: Payer: Self-pay | Admitting: Pulmonary Disease

## 2022-04-30 VITALS — BP 112/58 | HR 69 | Temp 97.7°F | Ht 70.0 in | Wt 146.0 lb

## 2022-04-30 DIAGNOSIS — J8410 Pulmonary fibrosis, unspecified: Secondary | ICD-10-CM | POA: Diagnosis not present

## 2022-04-30 LAB — PULMONARY FUNCTION TEST
DL/VA % pred: 117 %
DL/VA: 4.66 ml/min/mmHg/L
DLCO cor % pred: 68 %
DLCO cor: 17.14 ml/min/mmHg
DLCO unc % pred: 68 %
DLCO unc: 17.14 ml/min/mmHg
FEF 25-75 Post: 3.02 L/sec
FEF 25-75 Pre: 2.54 L/sec
FEF2575-%Change-Post: 18 %
FEF2575-%Pred-Post: 135 %
FEF2575-%Pred-Pre: 114 %
FEV1-%Change-Post: 3 %
FEV1-%Pred-Post: 76 %
FEV1-%Pred-Pre: 73 %
FEV1-Post: 2.33 L
FEV1-Pre: 2.25 L
FEV1FVC-%Change-Post: 7 %
FEV1FVC-%Pred-Pre: 117 %
FEV6-%Change-Post: -3 %
FEV6-%Pred-Post: 64 %
FEV6-%Pred-Pre: 66 %
FEV6-Post: 2.54 L
FEV6-Pre: 2.65 L
FEV6FVC-%Pred-Post: 106 %
FEV6FVC-%Pred-Pre: 106 %
FVC-%Change-Post: -3 %
FVC-%Pred-Post: 60 %
FVC-%Pred-Pre: 62 %
FVC-Post: 2.55 L
FVC-Pre: 2.65 L
Post FEV1/FVC ratio: 91 %
Post FEV6/FVC ratio: 100 %
Pre FEV1/FVC ratio: 85 %
Pre FEV6/FVC Ratio: 100 %
RV % pred: 61 %
RV: 1.57 L
TLC % pred: 61 %
TLC: 4.3 L

## 2022-04-30 NOTE — Progress Notes (Signed)
Full PFT performed today. °

## 2022-04-30 NOTE — Patient Instructions (Addendum)
I am glad you are stable with your breathing Your PFTs show a small improvement in lung capacity which is good news Will make sure that her high-resolution CT is ordered for June 2024 Return to clinic after CT scan in June 2024

## 2022-04-30 NOTE — Patient Instructions (Signed)
Full PFT performed today. °

## 2022-04-30 NOTE — Progress Notes (Signed)
Dustin Diaz    [id number]    Jan 05, 1946  Primary Care Physician:Griffin, Jenny Reichmann, MD  Referring Physician: Lavone Orn, MD [facility] [address],  [place] [address]  Chief complaint: Follow-up for post COVID-19  HPI: 77 y.o. with polymyalgia rheumatica, diabetes, hyperlipidemia, hypertension, melanoma Hospitalized with COVID-19 from 1/14-2/11/21, where he was treated with remdesivir, steroids, and 2 doses of Tocilizumab.  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  He complains of dyspnea on exertion, no cough, fevers, wheezing. Reports that over the past few weeks he starting to feel better.  Has gone back to fishing and recently completed a fishing trip out of state.  Pets: Dogs Occupation: Retired Engineer, maintenance (IT) Exposures: No known exposures.  No mold, hot tub, Jacuzzi.  No feather pillows or comforters Smoking history: Quit smoking in the 1970s Travel history: Previously lived in [place], [place], [place] Relevant family history: No significant family issue of lung disease  Interim history: Here for review of PFTs.  States that breathing is stable with no change Still loves to go fishing  Outpatient Encounter Medications as of 04/30/2022  Medication Sig   atorvastatin (LIPITOR) 10 MG tablet Take 10 mg by mouth daily.   fexofenadine (ALLEGRA) 180 MG tablet Take by mouth daily as needed.    fluticasone (FLONASE) 50 MCG/ACT nasal spray Place 2 sprays into the nose daily as needed for allergies.    glimepiride (AMARYL) 1 MG tablet Take 1 mg by mouth daily with breakfast.   JARDIANCE 25 MG TABS tablet Take 25 mg by mouth every morning.   latanoprost  (XALATAN) 0.005 % ophthalmic solution Place 1 drop into both eyes at bedtime.   metFORMIN (GLUCOPHAGE-XR) 500 MG 24 hr tablet Take 2,000 mg by mouth every morning.   predniSONE (DELTASONE) 1 MG tablet Take 3 mg by mouth daily.    [DISCONTINUED] valACYclovir (VALTREX) 1000 MG tablet Take by mouth.   No facility-administered encounter medications on file as of 04/30/2022.    Physical Exam: Blood pressure (!) 112/58, pulse 69, temperature 97.7 F (36.5 C), temperature source Oral, height 5' 10"$  (1.778 m), weight 146 lb (66.2 kg), SpO2 96 %. Gen:      No acute distress HEENT:  EOMI, sclera anicteric Neck:     No masses; no thyromegaly Lungs:    Clear to auscultation bilaterally; normal respiratory effort CV:         Regular rate and rhythm; no murmurs Abd:      + bowel sounds; soft, non-tender; no palpable masses, no distension Ext:    No edema; adequate peripheral perfusion Skin:      Warm and dry; no rash Neuro: alert and oriented x 3 Psych: normal mood and affect   Data Reviewed: Imaging: CTA 04/09/2019-no PE, severe airspace disease CT chest 04/20/19-persistent bilateral diffuse airspace disease, cystic lesion in the pancreas CT high-resolution 07/04/2019-improvement in groundglass opacities, bilateral fibrotic changes and probable UIP pattern CT high-resolution 01/11/2020-minimal decrease in pulmonary fibrosis CT high-resolution 08/11/2020-indeterminate for UIP CT high-resolution 08/28/2021-mild progression in probable UIP pattern I have reviewed images personally.  PFTs: 01/30/20 FVC 2.37 [4 5%], FEV1 2.17 [69%],  F/F 91, TLC 3.97 [56%], DLCO 17.29 [69%] Moderate restriction with mild diffusion defect.  Overall improvement in lung volumes and diffusion capacity compared to April 2021  04/30/2022 FVC 2.55 [60%], FEV1 2.33 [76%], F/F91, TLC 4.30 [61%], DLCO 17.14 [68%] Moderate restriction, mild diffusion defect.  Improvement in lung volumes compared to 2021  Labs: CTD serologies  10/20/2021-negative  Assessment:  Post Covid pulmonary fibrosis He has stable clinical symptoms. Last CT is read as progression, now in probable UIP pattern but I am not convinced that it is actually worse.  His lung volumes continue to show improvement from 2021 on PFTs today and symptomatically he is stable.  We will continue to monitor closely.  CTD serologies are negative Order follow-up high-res CT for June 2024.  Plan/Recommendations: Follow-up high-resolution CT.  Marshell Garfinkel MD  Pulmonary and Critical Care 04/30/2022, 10:14 AM  CC: Lavone Orn, MD

## 2022-04-30 NOTE — Addendum Note (Signed)
Addended by: Elton Sin on: 04/30/2022 11:01 AM   Modules accepted: Orders

## 2022-05-18 DIAGNOSIS — Z961 Presence of intraocular lens: Secondary | ICD-10-CM | POA: Diagnosis not present

## 2022-05-18 DIAGNOSIS — H02834 Dermatochalasis of left upper eyelid: Secondary | ICD-10-CM | POA: Diagnosis not present

## 2022-05-18 DIAGNOSIS — H26493 Other secondary cataract, bilateral: Secondary | ICD-10-CM | POA: Diagnosis not present

## 2022-05-18 DIAGNOSIS — H40013 Open angle with borderline findings, low risk, bilateral: Secondary | ICD-10-CM | POA: Diagnosis not present

## 2022-05-18 DIAGNOSIS — E113293 Type 2 diabetes mellitus with mild nonproliferative diabetic retinopathy without macular edema, bilateral: Secondary | ICD-10-CM | POA: Diagnosis not present

## 2022-05-18 DIAGNOSIS — H40053 Ocular hypertension, bilateral: Secondary | ICD-10-CM | POA: Diagnosis not present

## 2022-05-18 DIAGNOSIS — H35033 Hypertensive retinopathy, bilateral: Secondary | ICD-10-CM | POA: Diagnosis not present

## 2022-05-25 DIAGNOSIS — M353 Polymyalgia rheumatica: Secondary | ICD-10-CM | POA: Diagnosis not present

## 2022-05-25 DIAGNOSIS — R69 Illness, unspecified: Secondary | ICD-10-CM | POA: Diagnosis not present

## 2022-05-25 DIAGNOSIS — D126 Benign neoplasm of colon, unspecified: Secondary | ICD-10-CM | POA: Diagnosis not present

## 2022-05-25 DIAGNOSIS — E113293 Type 2 diabetes mellitus with mild nonproliferative diabetic retinopathy without macular edema, bilateral: Secondary | ICD-10-CM | POA: Diagnosis not present

## 2022-05-25 DIAGNOSIS — N182 Chronic kidney disease, stage 2 (mild): Secondary | ICD-10-CM | POA: Diagnosis not present

## 2022-05-25 DIAGNOSIS — I10 Essential (primary) hypertension: Secondary | ICD-10-CM | POA: Diagnosis not present

## 2022-05-25 DIAGNOSIS — I700 Atherosclerosis of aorta: Secondary | ICD-10-CM | POA: Diagnosis not present

## 2022-05-25 DIAGNOSIS — Z79899 Other long term (current) drug therapy: Secondary | ICD-10-CM | POA: Diagnosis not present

## 2022-05-25 DIAGNOSIS — J8410 Pulmonary fibrosis, unspecified: Secondary | ICD-10-CM | POA: Diagnosis not present

## 2022-05-25 DIAGNOSIS — Z0000 Encounter for general adult medical examination without abnormal findings: Secondary | ICD-10-CM | POA: Diagnosis not present

## 2022-05-25 DIAGNOSIS — E1122 Type 2 diabetes mellitus with diabetic chronic kidney disease: Secondary | ICD-10-CM | POA: Diagnosis not present

## 2022-06-03 DIAGNOSIS — L820 Inflamed seborrheic keratosis: Secondary | ICD-10-CM | POA: Diagnosis not present

## 2022-06-03 DIAGNOSIS — Z1283 Encounter for screening for malignant neoplasm of skin: Secondary | ICD-10-CM | POA: Diagnosis not present

## 2022-06-03 DIAGNOSIS — D225 Melanocytic nevi of trunk: Secondary | ICD-10-CM | POA: Diagnosis not present

## 2022-06-03 DIAGNOSIS — Z08 Encounter for follow-up examination after completed treatment for malignant neoplasm: Secondary | ICD-10-CM | POA: Diagnosis not present

## 2022-06-03 DIAGNOSIS — Z85820 Personal history of malignant melanoma of skin: Secondary | ICD-10-CM | POA: Diagnosis not present

## 2022-06-17 DIAGNOSIS — C4442 Squamous cell carcinoma of skin of scalp and neck: Secondary | ICD-10-CM | POA: Diagnosis not present

## 2022-07-01 DIAGNOSIS — L570 Actinic keratosis: Secondary | ICD-10-CM | POA: Diagnosis not present

## 2022-07-01 DIAGNOSIS — X32XXXD Exposure to sunlight, subsequent encounter: Secondary | ICD-10-CM | POA: Diagnosis not present

## 2022-08-31 ENCOUNTER — Ambulatory Visit
Admission: RE | Admit: 2022-08-31 | Discharge: 2022-08-31 | Disposition: A | Discharge status: Home / Self Care | Payer: Medicare HMO | Source: Ambulatory Visit | Attending: Pulmonary Disease | Admitting: Pulmonary Disease

## 2022-08-31 DIAGNOSIS — I2510 Atherosclerotic heart disease of native coronary artery without angina pectoris: Secondary | ICD-10-CM | POA: Diagnosis not present

## 2022-08-31 DIAGNOSIS — J8410 Pulmonary fibrosis, unspecified: Secondary | ICD-10-CM

## 2022-08-31 DIAGNOSIS — I719 Aortic aneurysm of unspecified site, without rupture: Secondary | ICD-10-CM | POA: Diagnosis not present

## 2022-08-31 DIAGNOSIS — J984 Other disorders of lung: Secondary | ICD-10-CM | POA: Diagnosis not present

## 2022-09-21 DIAGNOSIS — H353132 Nonexudative age-related macular degeneration, bilateral, intermediate dry stage: Secondary | ICD-10-CM | POA: Diagnosis not present

## 2022-09-21 DIAGNOSIS — H40053 Ocular hypertension, bilateral: Secondary | ICD-10-CM | POA: Diagnosis not present

## 2022-09-21 DIAGNOSIS — E113293 Type 2 diabetes mellitus with mild nonproliferative diabetic retinopathy without macular edema, bilateral: Secondary | ICD-10-CM | POA: Diagnosis not present

## 2022-09-21 DIAGNOSIS — H40013 Open angle with borderline findings, low risk, bilateral: Secondary | ICD-10-CM | POA: Diagnosis not present

## 2022-10-01 DIAGNOSIS — M353 Polymyalgia rheumatica: Secondary | ICD-10-CM | POA: Diagnosis not present

## 2022-10-01 DIAGNOSIS — E113293 Type 2 diabetes mellitus with mild nonproliferative diabetic retinopathy without macular edema, bilateral: Secondary | ICD-10-CM | POA: Diagnosis not present

## 2022-10-01 DIAGNOSIS — E1165 Type 2 diabetes mellitus with hyperglycemia: Secondary | ICD-10-CM | POA: Diagnosis not present

## 2022-10-01 DIAGNOSIS — J8410 Pulmonary fibrosis, unspecified: Secondary | ICD-10-CM | POA: Diagnosis not present

## 2022-10-01 DIAGNOSIS — E1122 Type 2 diabetes mellitus with diabetic chronic kidney disease: Secondary | ICD-10-CM | POA: Diagnosis not present

## 2022-10-01 DIAGNOSIS — I700 Atherosclerosis of aorta: Secondary | ICD-10-CM | POA: Diagnosis not present

## 2022-10-01 DIAGNOSIS — E1136 Type 2 diabetes mellitus with diabetic cataract: Secondary | ICD-10-CM | POA: Diagnosis not present

## 2022-10-01 DIAGNOSIS — I10 Essential (primary) hypertension: Secondary | ICD-10-CM | POA: Diagnosis not present

## 2022-10-08 DIAGNOSIS — Z09 Encounter for follow-up examination after completed treatment for conditions other than malignant neoplasm: Secondary | ICD-10-CM | POA: Diagnosis not present

## 2022-10-08 DIAGNOSIS — Z86010 Personal history of colonic polyps: Secondary | ICD-10-CM | POA: Diagnosis not present

## 2022-10-08 DIAGNOSIS — D122 Benign neoplasm of ascending colon: Secondary | ICD-10-CM | POA: Diagnosis not present

## 2022-10-08 DIAGNOSIS — D123 Benign neoplasm of transverse colon: Secondary | ICD-10-CM | POA: Diagnosis not present

## 2022-10-11 DIAGNOSIS — C4442 Squamous cell carcinoma of skin of scalp and neck: Secondary | ICD-10-CM | POA: Diagnosis not present

## 2022-10-12 DIAGNOSIS — D123 Benign neoplasm of transverse colon: Secondary | ICD-10-CM | POA: Diagnosis not present

## 2022-10-12 DIAGNOSIS — D122 Benign neoplasm of ascending colon: Secondary | ICD-10-CM | POA: Diagnosis not present

## 2022-10-25 DIAGNOSIS — R69 Illness, unspecified: Secondary | ICD-10-CM | POA: Diagnosis not present

## 2022-11-25 DIAGNOSIS — Z08 Encounter for follow-up examination after completed treatment for malignant neoplasm: Secondary | ICD-10-CM | POA: Diagnosis not present

## 2022-11-25 DIAGNOSIS — Z85820 Personal history of malignant melanoma of skin: Secondary | ICD-10-CM | POA: Diagnosis not present

## 2022-11-25 DIAGNOSIS — X32XXXD Exposure to sunlight, subsequent encounter: Secondary | ICD-10-CM | POA: Diagnosis not present

## 2022-11-25 DIAGNOSIS — L570 Actinic keratosis: Secondary | ICD-10-CM | POA: Diagnosis not present

## 2022-11-25 DIAGNOSIS — Z1283 Encounter for screening for malignant neoplasm of skin: Secondary | ICD-10-CM | POA: Diagnosis not present

## 2023-03-29 DIAGNOSIS — H40053 Ocular hypertension, bilateral: Secondary | ICD-10-CM | POA: Diagnosis not present

## 2023-03-29 DIAGNOSIS — H40013 Open angle with borderline findings, low risk, bilateral: Secondary | ICD-10-CM | POA: Diagnosis not present

## 2023-03-29 DIAGNOSIS — E113293 Type 2 diabetes mellitus with mild nonproliferative diabetic retinopathy without macular edema, bilateral: Secondary | ICD-10-CM | POA: Diagnosis not present

## 2023-03-29 DIAGNOSIS — H353132 Nonexudative age-related macular degeneration, bilateral, intermediate dry stage: Secondary | ICD-10-CM | POA: Diagnosis not present

## 2023-04-06 DIAGNOSIS — H353132 Nonexudative age-related macular degeneration, bilateral, intermediate dry stage: Secondary | ICD-10-CM | POA: Diagnosis not present

## 2023-04-06 DIAGNOSIS — H43813 Vitreous degeneration, bilateral: Secondary | ICD-10-CM | POA: Diagnosis not present

## 2023-04-06 DIAGNOSIS — E133293 Other specified diabetes mellitus with mild nonproliferative diabetic retinopathy without macular edema, bilateral: Secondary | ICD-10-CM | POA: Diagnosis not present

## 2023-04-06 DIAGNOSIS — H35371 Puckering of macula, right eye: Secondary | ICD-10-CM | POA: Diagnosis not present

## 2023-04-18 DIAGNOSIS — C434 Malignant melanoma of scalp and neck: Secondary | ICD-10-CM | POA: Diagnosis not present

## 2023-04-18 DIAGNOSIS — C4442 Squamous cell carcinoma of skin of scalp and neck: Secondary | ICD-10-CM | POA: Diagnosis not present

## 2023-05-09 DIAGNOSIS — M25511 Pain in right shoulder: Secondary | ICD-10-CM | POA: Diagnosis not present

## 2023-05-09 DIAGNOSIS — M25512 Pain in left shoulder: Secondary | ICD-10-CM | POA: Diagnosis not present

## 2023-05-26 ENCOUNTER — Other Ambulatory Visit: Payer: Self-pay | Admitting: Internal Medicine

## 2023-05-26 DIAGNOSIS — Z23 Encounter for immunization: Secondary | ICD-10-CM | POA: Diagnosis not present

## 2023-05-26 DIAGNOSIS — R200 Anesthesia of skin: Secondary | ICD-10-CM | POA: Diagnosis not present

## 2023-05-26 DIAGNOSIS — Z7952 Long term (current) use of systemic steroids: Secondary | ICD-10-CM

## 2023-05-26 DIAGNOSIS — R053 Chronic cough: Secondary | ICD-10-CM | POA: Diagnosis not present

## 2023-05-26 DIAGNOSIS — Z0000 Encounter for general adult medical examination without abnormal findings: Secondary | ICD-10-CM | POA: Diagnosis not present

## 2023-06-15 ENCOUNTER — Ambulatory Visit
Admission: RE | Admit: 2023-06-15 | Discharge: 2023-06-15 | Disposition: A | Discharge status: Home / Self Care | Source: Ambulatory Visit | Attending: Internal Medicine | Admitting: Internal Medicine

## 2023-06-15 DIAGNOSIS — M353 Polymyalgia rheumatica: Secondary | ICD-10-CM | POA: Diagnosis not present

## 2023-06-15 DIAGNOSIS — Z7952 Long term (current) use of systemic steroids: Secondary | ICD-10-CM

## 2023-07-07 DIAGNOSIS — E113293 Type 2 diabetes mellitus with mild nonproliferative diabetic retinopathy without macular edema, bilateral: Secondary | ICD-10-CM | POA: Diagnosis not present

## 2023-07-07 DIAGNOSIS — G479 Sleep disorder, unspecified: Secondary | ICD-10-CM | POA: Diagnosis not present

## 2023-07-07 DIAGNOSIS — M353 Polymyalgia rheumatica: Secondary | ICD-10-CM | POA: Diagnosis not present

## 2023-07-07 DIAGNOSIS — M25512 Pain in left shoulder: Secondary | ICD-10-CM | POA: Diagnosis not present

## 2023-07-07 DIAGNOSIS — F330 Major depressive disorder, recurrent, mild: Secondary | ICD-10-CM | POA: Diagnosis not present

## 2023-07-07 DIAGNOSIS — E1121 Type 2 diabetes mellitus with diabetic nephropathy: Secondary | ICD-10-CM | POA: Diagnosis not present

## 2023-07-07 DIAGNOSIS — M25511 Pain in right shoulder: Secondary | ICD-10-CM | POA: Diagnosis not present

## 2023-07-07 DIAGNOSIS — J8410 Pulmonary fibrosis, unspecified: Secondary | ICD-10-CM | POA: Diagnosis not present

## 2023-07-07 DIAGNOSIS — I10 Essential (primary) hypertension: Secondary | ICD-10-CM | POA: Diagnosis not present

## 2023-07-07 DIAGNOSIS — N182 Chronic kidney disease, stage 2 (mild): Secondary | ICD-10-CM | POA: Diagnosis not present

## 2023-07-07 DIAGNOSIS — E1122 Type 2 diabetes mellitus with diabetic chronic kidney disease: Secondary | ICD-10-CM | POA: Diagnosis not present

## 2023-07-07 DIAGNOSIS — T7840XS Allergy, unspecified, sequela: Secondary | ICD-10-CM | POA: Diagnosis not present

## 2023-08-18 DIAGNOSIS — M5412 Radiculopathy, cervical region: Secondary | ICD-10-CM | POA: Diagnosis not present

## 2023-08-18 DIAGNOSIS — G5602 Carpal tunnel syndrome, left upper limb: Secondary | ICD-10-CM | POA: Diagnosis not present

## 2023-09-27 DIAGNOSIS — H524 Presbyopia: Secondary | ICD-10-CM | POA: Diagnosis not present

## 2023-09-27 DIAGNOSIS — H353132 Nonexudative age-related macular degeneration, bilateral, intermediate dry stage: Secondary | ICD-10-CM | POA: Diagnosis not present

## 2023-09-27 DIAGNOSIS — H40053 Ocular hypertension, bilateral: Secondary | ICD-10-CM | POA: Diagnosis not present

## 2023-09-27 DIAGNOSIS — H52223 Regular astigmatism, bilateral: Secondary | ICD-10-CM | POA: Diagnosis not present

## 2023-09-27 DIAGNOSIS — H40013 Open angle with borderline findings, low risk, bilateral: Secondary | ICD-10-CM | POA: Diagnosis not present

## 2023-09-27 DIAGNOSIS — E113293 Type 2 diabetes mellitus with mild nonproliferative diabetic retinopathy without macular edema, bilateral: Secondary | ICD-10-CM | POA: Diagnosis not present

## 2023-10-17 DIAGNOSIS — C434 Malignant melanoma of scalp and neck: Secondary | ICD-10-CM | POA: Diagnosis not present

## 2023-10-17 DIAGNOSIS — C4442 Squamous cell carcinoma of skin of scalp and neck: Secondary | ICD-10-CM | POA: Diagnosis not present

## 2023-11-02 DIAGNOSIS — H353132 Nonexudative age-related macular degeneration, bilateral, intermediate dry stage: Secondary | ICD-10-CM | POA: Diagnosis not present

## 2023-11-02 DIAGNOSIS — H43813 Vitreous degeneration, bilateral: Secondary | ICD-10-CM | POA: Diagnosis not present

## 2023-11-02 DIAGNOSIS — E113293 Type 2 diabetes mellitus with mild nonproliferative diabetic retinopathy without macular edema, bilateral: Secondary | ICD-10-CM | POA: Diagnosis not present

## 2023-11-02 DIAGNOSIS — H35371 Puckering of macula, right eye: Secondary | ICD-10-CM | POA: Diagnosis not present

## 2023-11-07 DIAGNOSIS — M4722 Other spondylosis with radiculopathy, cervical region: Secondary | ICD-10-CM | POA: Diagnosis not present

## 2024-02-02 DIAGNOSIS — E1122 Type 2 diabetes mellitus with diabetic chronic kidney disease: Secondary | ICD-10-CM | POA: Diagnosis not present

## 2024-02-02 DIAGNOSIS — R32 Unspecified urinary incontinence: Secondary | ICD-10-CM | POA: Diagnosis not present

## 2024-02-02 DIAGNOSIS — M353 Polymyalgia rheumatica: Secondary | ICD-10-CM | POA: Diagnosis not present

## 2024-02-02 DIAGNOSIS — J8410 Pulmonary fibrosis, unspecified: Secondary | ICD-10-CM | POA: Diagnosis not present

## 2024-02-02 DIAGNOSIS — R0989 Other specified symptoms and signs involving the circulatory and respiratory systems: Secondary | ICD-10-CM | POA: Diagnosis not present

## 2024-02-02 DIAGNOSIS — E1121 Type 2 diabetes mellitus with diabetic nephropathy: Secondary | ICD-10-CM | POA: Diagnosis not present

## 2024-02-02 DIAGNOSIS — E113293 Type 2 diabetes mellitus with mild nonproliferative diabetic retinopathy without macular edema, bilateral: Secondary | ICD-10-CM | POA: Diagnosis not present
